# Patient Record
Sex: Male | Born: 1980 | Race: White | Hispanic: No | Marital: Single | Smoking: Former smoker
Health system: Southern US, Community
[De-identification: ages and names within clinical notes are randomized; demographics above are authoritative.]

## PROBLEM LIST (undated history)

## (undated) DIAGNOSIS — Z992 Dependence on renal dialysis: Secondary | ICD-10-CM

## (undated) DIAGNOSIS — I272 Pulmonary hypertension, unspecified: Secondary | ICD-10-CM

## (undated) DIAGNOSIS — E1140 Type 2 diabetes mellitus with diabetic neuropathy, unspecified: Secondary | ICD-10-CM

## (undated) DIAGNOSIS — E1122 Type 2 diabetes mellitus with diabetic chronic kidney disease: Secondary | ICD-10-CM

## (undated) DIAGNOSIS — I428 Other cardiomyopathies: Secondary | ICD-10-CM

## (undated) DIAGNOSIS — E11311 Type 2 diabetes mellitus with unspecified diabetic retinopathy with macular edema: Secondary | ICD-10-CM

## (undated) DIAGNOSIS — D649 Anemia, unspecified: Secondary | ICD-10-CM

## (undated) DIAGNOSIS — Z87442 Personal history of urinary calculi: Secondary | ICD-10-CM

## (undated) DIAGNOSIS — K3184 Gastroparesis: Secondary | ICD-10-CM

## (undated) DIAGNOSIS — I351 Nonrheumatic aortic (valve) insufficiency: Secondary | ICD-10-CM

## (undated) DIAGNOSIS — E1121 Type 2 diabetes mellitus with diabetic nephropathy: Secondary | ICD-10-CM

## (undated) DIAGNOSIS — E119 Type 2 diabetes mellitus without complications: Secondary | ICD-10-CM

## (undated) DIAGNOSIS — N186 End stage renal disease: Secondary | ICD-10-CM

## (undated) DIAGNOSIS — I10 Essential (primary) hypertension: Secondary | ICD-10-CM

## (undated) DIAGNOSIS — I340 Nonrheumatic mitral (valve) insufficiency: Secondary | ICD-10-CM

## (undated) DIAGNOSIS — K219 Gastro-esophageal reflux disease without esophagitis: Secondary | ICD-10-CM

## (undated) DIAGNOSIS — E11319 Type 2 diabetes mellitus with unspecified diabetic retinopathy without macular edema: Secondary | ICD-10-CM

## (undated) DIAGNOSIS — I5189 Other ill-defined heart diseases: Secondary | ICD-10-CM

## (undated) DIAGNOSIS — H5440 Blindness, one eye, unspecified eye: Secondary | ICD-10-CM

## (undated) DIAGNOSIS — N185 Chronic kidney disease, stage 5: Secondary | ICD-10-CM

## (undated) DIAGNOSIS — I5022 Chronic systolic (congestive) heart failure: Secondary | ICD-10-CM

## (undated) HISTORY — PX: OTHER SURGICAL HISTORY: SHX169

## (undated) HISTORY — PX: DIALYSIS/PERMA CATHETER INSERTION: CATH118288

## (undated) HISTORY — PX: PORT A CATH INJECTION (ARMC HX): HXRAD1731

---

## 2007-11-30 ENCOUNTER — Ambulatory Visit: Payer: Self-pay | Admitting: Internal Medicine

## 2007-12-10 ENCOUNTER — Ambulatory Visit: Payer: Self-pay | Admitting: Family Medicine

## 2008-07-08 ENCOUNTER — Emergency Department: Payer: Self-pay | Admitting: Emergency Medicine

## 2009-03-26 DIAGNOSIS — E1142 Type 2 diabetes mellitus with diabetic polyneuropathy: Secondary | ICD-10-CM | POA: Insufficient documentation

## 2009-03-26 DIAGNOSIS — N529 Male erectile dysfunction, unspecified: Secondary | ICD-10-CM | POA: Insufficient documentation

## 2011-05-29 ENCOUNTER — Emergency Department: Payer: Self-pay | Admitting: Emergency Medicine

## 2012-05-04 ENCOUNTER — Emergency Department: Payer: Self-pay | Admitting: Emergency Medicine

## 2012-05-04 LAB — BASIC METABOLIC PANEL
Anion Gap: 2 — ABNORMAL LOW (ref 7–16)
Calcium, Total: 8.7 mg/dL (ref 8.5–10.1)
Chloride: 102 mmol/L (ref 98–107)
Creatinine: 0.75 mg/dL (ref 0.60–1.30)
EGFR (Non-African Amer.): >60
Osmolality: 280 (ref 275–301)
Potassium: 4.5 mmol/L (ref 3.5–5.1)
Sodium: 136 mmol/L (ref 136–145)

## 2012-05-04 LAB — CBC
HCT: 45.1 % (ref 40.0–52.0)
MCV: 89 fL (ref 80–100)
Platelet: 181 10*3/uL (ref 150–440)
WBC: 4.5 10*3/uL (ref 3.8–10.6)

## 2012-05-04 LAB — URINALYSIS, COMPLETE
Glucose,UR: 150 mg/dL (ref 0–75)
Leukocyte Esterase: NEGATIVE
Nitrite: NEGATIVE
Ph: 6 (ref 4.5–8.0)
Protein: NEGATIVE
RBC,UR: 41 /HPF (ref 0–5)
Specific Gravity: 1.004 (ref 1.003–1.030)
WBC UR: 1 /HPF (ref 0–5)

## 2015-10-29 DIAGNOSIS — E1122 Type 2 diabetes mellitus with diabetic chronic kidney disease: Secondary | ICD-10-CM | POA: Insufficient documentation

## 2015-10-29 DIAGNOSIS — Z794 Long term (current) use of insulin: Secondary | ICD-10-CM | POA: Insufficient documentation

## 2015-10-29 DIAGNOSIS — IMO0002 Reserved for concepts with insufficient information to code with codable children: Secondary | ICD-10-CM | POA: Insufficient documentation

## 2015-10-29 HISTORY — DX: Type 2 diabetes mellitus with diabetic chronic kidney disease: E11.22

## 2016-07-08 ENCOUNTER — Encounter: Payer: Self-pay | Admitting: Emergency Medicine

## 2016-07-08 ENCOUNTER — Emergency Department: Payer: Self-pay

## 2016-07-08 ENCOUNTER — Inpatient Hospital Stay
Admission: EM | Admit: 2016-07-08 | Discharge: 2016-07-10 | DRG: 638 | Disposition: A | Discharge status: Home / Self Care | Payer: Self-pay | Attending: Internal Medicine | Admitting: Internal Medicine

## 2016-07-08 DIAGNOSIS — I214 Non-ST elevation (NSTEMI) myocardial infarction: Secondary | ICD-10-CM | POA: Diagnosis present

## 2016-07-08 DIAGNOSIS — Z833 Family history of diabetes mellitus: Secondary | ICD-10-CM

## 2016-07-08 DIAGNOSIS — Z880 Allergy status to penicillin: Secondary | ICD-10-CM

## 2016-07-08 DIAGNOSIS — E1110 Type 2 diabetes mellitus with ketoacidosis without coma: Secondary | ICD-10-CM | POA: Diagnosis present

## 2016-07-08 DIAGNOSIS — I2510 Atherosclerotic heart disease of native coronary artery without angina pectoris: Secondary | ICD-10-CM | POA: Diagnosis present

## 2016-07-08 DIAGNOSIS — I248 Other forms of acute ischemic heart disease: Secondary | ICD-10-CM | POA: Diagnosis present

## 2016-07-08 DIAGNOSIS — I219 Acute myocardial infarction, unspecified: Secondary | ICD-10-CM

## 2016-07-08 DIAGNOSIS — T63301A Toxic effect of unspecified spider venom, accidental (unintentional), initial encounter: Secondary | ICD-10-CM | POA: Diagnosis present

## 2016-07-08 DIAGNOSIS — Z87891 Personal history of nicotine dependence: Secondary | ICD-10-CM

## 2016-07-08 DIAGNOSIS — M79604 Pain in right leg: Secondary | ICD-10-CM | POA: Diagnosis present

## 2016-07-08 DIAGNOSIS — R52 Pain, unspecified: Secondary | ICD-10-CM

## 2016-07-08 DIAGNOSIS — R0789 Other chest pain: Secondary | ICD-10-CM | POA: Diagnosis present

## 2016-07-08 DIAGNOSIS — Y929 Unspecified place or not applicable: Secondary | ICD-10-CM

## 2016-07-08 DIAGNOSIS — E1310 Other specified diabetes mellitus with ketoacidosis without coma: Principal | ICD-10-CM | POA: Diagnosis present

## 2016-07-08 DIAGNOSIS — T383X6A Underdosing of insulin and oral hypoglycemic [antidiabetic] drugs, initial encounter: Secondary | ICD-10-CM | POA: Diagnosis present

## 2016-07-08 DIAGNOSIS — I82401 Acute embolism and thrombosis of unspecified deep veins of right lower extremity: Secondary | ICD-10-CM

## 2016-07-08 DIAGNOSIS — Z91128 Patient's intentional underdosing of medication regimen for other reason: Secondary | ICD-10-CM

## 2016-07-08 HISTORY — DX: Acute myocardial infarction, unspecified: I21.9

## 2016-07-08 HISTORY — DX: Type 2 diabetes mellitus without complications: E11.9

## 2016-07-08 HISTORY — DX: Type 2 diabetes mellitus with ketoacidosis without coma: E11.10

## 2016-07-08 LAB — COMPREHENSIVE METABOLIC PANEL
ALBUMIN: 3.2 g/dL — AB (ref 3.5–5.0)
ALK PHOS: 159 U/L — AB (ref 38–126)
ALT: 99 U/L — AB (ref 17–63)
AST: 21 U/L (ref 15–41)
Anion gap: 14 (ref 5–15)
BUN: 12 mg/dL (ref 6–20)
CALCIUM: 8.7 mg/dL — AB (ref 8.9–10.3)
CO2: 17 mmol/L — AB (ref 22–32)
CREATININE: 0.87 mg/dL (ref 0.61–1.24)
Chloride: 98 mmol/L — ABNORMAL LOW (ref 101–111)
GFR calc Af Amer: >60 mL/min (ref >60)
GFR calc non Af Amer: >60 mL/min (ref >60)
GLUCOSE: 377 mg/dL — AB (ref 65–99)
Potassium: 4.8 mmol/L (ref 3.5–5.1)
SODIUM: 129 mmol/L — AB (ref 135–145)
Total Bilirubin: 1.4 mg/dL — ABNORMAL HIGH (ref 0.3–1.2)
Total Protein: 6.9 g/dL (ref 6.5–8.1)

## 2016-07-08 LAB — URINE DRUG SCREEN, QUALITATIVE (ARMC ONLY)
Amphetamines, Ur Screen: NOT DETECTED
BARBITURATES, UR SCREEN: NOT DETECTED
BENZODIAZEPINE, UR SCRN: NOT DETECTED
Cannabinoid 50 Ng, Ur ~~LOC~~: NOT DETECTED
Cocaine Metabolite,Ur ~~LOC~~: NOT DETECTED
MDMA (Ecstasy)Ur Screen: NOT DETECTED
METHADONE SCREEN, URINE: NOT DETECTED
OPIATE, UR SCREEN: NOT DETECTED
PHENCYCLIDINE (PCP) UR S: NOT DETECTED
Tricyclic, Ur Screen: NOT DETECTED

## 2016-07-08 LAB — URINALYSIS COMPLETE WITH MICROSCOPIC (ARMC ONLY)
BILIRUBIN URINE: NEGATIVE
Bacteria, UA: NONE SEEN
Glucose, UA: >500 mg/dL — AB
Nitrite: POSITIVE — AB
PH: 6 (ref 5.0–8.0)
Protein, ur: 100 mg/dL — AB
SQUAMOUS EPITHELIAL / LPF: NONE SEEN
Specific Gravity, Urine: 1.029 (ref 1.005–1.030)

## 2016-07-08 LAB — CBC WITH DIFFERENTIAL/PLATELET
BASOS PCT: 0 %
Basophils Absolute: 0 10*3/uL (ref 0–0.1)
EOS ABS: 0 10*3/uL (ref 0–0.7)
Eosinophils Relative: 0 %
HCT: 42.7 % (ref 40.0–52.0)
Hemoglobin: 15.1 g/dL (ref 13.0–18.0)
Lymphocytes Relative: 9 %
Lymphs Abs: 1.1 10*3/uL (ref 1.0–3.6)
MCH: 30.8 pg (ref 26.0–34.0)
MCHC: 35.3 g/dL (ref 32.0–36.0)
MCV: 87.4 fL (ref 80.0–100.0)
MONO ABS: 1.5 10*3/uL — AB (ref 0.2–1.0)
Monocytes Relative: 12 %
Neutro Abs: 10.2 10*3/uL — ABNORMAL HIGH (ref 1.4–6.5)
Neutrophils Relative %: 79 %
PLATELETS: 157 10*3/uL (ref 150–440)
RBC: 4.88 MIL/uL (ref 4.40–5.90)
RDW: 12.6 % (ref 11.5–14.5)
WBC: 12.9 10*3/uL — ABNORMAL HIGH (ref 3.8–10.6)

## 2016-07-08 LAB — BASIC METABOLIC PANEL
Anion gap: 9 (ref 5–15)
BUN: 13 mg/dL (ref 6–20)
CALCIUM: 8.2 mg/dL — AB (ref 8.9–10.3)
CHLORIDE: 101 mmol/L (ref 101–111)
CO2: 22 mmol/L (ref 22–32)
CREATININE: 0.65 mg/dL (ref 0.61–1.24)
GFR calc non Af Amer: >60 mL/min (ref >60)
GLUCOSE: 289 mg/dL — AB (ref 65–99)
Potassium: 3.8 mmol/L (ref 3.5–5.1)
Sodium: 132 mmol/L — ABNORMAL LOW (ref 135–145)

## 2016-07-08 LAB — GLUCOSE, CAPILLARY
GLUCOSE-CAPILLARY: 234 mg/dL — AB (ref 65–99)
GLUCOSE-CAPILLARY: 264 mg/dL — AB (ref 65–99)
GLUCOSE-CAPILLARY: 336 mg/dL — AB (ref 65–99)
GLUCOSE-CAPILLARY: 364 mg/dL — AB (ref 65–99)
Glucose-Capillary: 304 mg/dL — ABNORMAL HIGH (ref 65–99)
Glucose-Capillary: 333 mg/dL — ABNORMAL HIGH (ref 65–99)

## 2016-07-08 LAB — MRSA PCR SCREENING: MRSA by PCR: NEGATIVE

## 2016-07-08 LAB — CK: Total CK: 37 U/L — ABNORMAL LOW (ref 49–397)

## 2016-07-08 LAB — BETA-HYDROXYBUTYRIC ACID: Beta-Hydroxybutyric Acid: 4.09 mmol/L — ABNORMAL HIGH (ref 0.05–0.27)

## 2016-07-08 LAB — TROPONIN I: Troponin I: 0.12 ng/mL (ref <0.03)

## 2016-07-08 MED ORDER — ONDANSETRON HCL 4 MG/2ML IJ SOLN: 4.0000 mg | Freq: Four times a day (QID) | INTRAMUSCULAR | Status: DC | PRN

## 2016-07-08 MED ORDER — SODIUM CHLORIDE 0.9% FLUSH
3.0000 mL | Freq: Two times a day (BID) | INTRAVENOUS | Status: DC
  Administered 2016-07-08 – 2016-07-10 (×3): 3 mL via INTRAVENOUS

## 2016-07-08 MED ORDER — ACETAMINOPHEN 325 MG PO TABS
650.0000 mg | ORAL_TABLET | Freq: Four times a day (QID) | ORAL | Status: DC | PRN
  Administered 2016-07-09 – 2016-07-10 (×3): 650 mg via ORAL
  Filled 2016-07-08 (×3): qty 2

## 2016-07-08 MED ORDER — CLINDAMYCIN HCL 150 MG PO CAPS
300.0000 mg | ORAL_CAPSULE | Freq: Four times a day (QID) | ORAL | Status: DC
  Administered 2016-07-09 – 2016-07-10 (×6): 300 mg via ORAL
  Filled 2016-07-08: qty 2
  Filled 2016-07-08: qty 1
  Filled 2016-07-08 (×2): qty 2
  Filled 2016-07-08: qty 1
  Filled 2016-07-08 (×3): qty 2

## 2016-07-08 MED ORDER — ONDANSETRON HCL 4 MG PO TABS: 4.0000 mg | ORAL_TABLET | Freq: Four times a day (QID) | ORAL | Status: DC | PRN

## 2016-07-08 MED ORDER — ASPIRIN EC 81 MG PO TBEC
81.0000 mg | DELAYED_RELEASE_TABLET | Freq: Every day | ORAL | Status: DC
  Administered 2016-07-09 – 2016-07-10 (×2): 81 mg via ORAL
  Filled 2016-07-08 (×3): qty 1

## 2016-07-08 MED ORDER — MORPHINE SULFATE (PF) 2 MG/ML IV SOLN: 2.0000 mg | INTRAVENOUS | Status: DC | PRN

## 2016-07-08 MED ORDER — ACETAMINOPHEN 650 MG RE SUPP: 650.0000 mg | Freq: Four times a day (QID) | RECTAL | Status: DC | PRN

## 2016-07-08 MED ORDER — ASPIRIN 81 MG PO CHEW
324.0000 mg | CHEWABLE_TABLET | Freq: Once | ORAL | Status: AC
  Administered 2016-07-08: 324 mg via ORAL
  Filled 2016-07-08: qty 4

## 2016-07-08 MED ORDER — CLINDAMYCIN PHOSPHATE 600 MG/50ML IV SOLN
600.0000 mg | Freq: Once | INTRAVENOUS | Status: AC
  Administered 2016-07-08: 600 mg via INTRAVENOUS
  Filled 2016-07-08: qty 50

## 2016-07-08 MED ORDER — SODIUM CHLORIDE 0.9 % IV SOLN
INTRAVENOUS | Status: DC
  Administered 2016-07-08: 20:00:00 via INTRAVENOUS

## 2016-07-08 MED ORDER — INSULIN ASPART 100 UNIT/ML ~~LOC~~ SOLN
8.0000 [IU] | Freq: Once | SUBCUTANEOUS | Status: AC
  Administered 2016-07-08: 8 [IU] via INTRAVENOUS
  Filled 2016-07-08: qty 8

## 2016-07-08 MED ORDER — DULOXETINE HCL 20 MG PO CPEP
20.0000 mg | ORAL_CAPSULE | Freq: Every day | ORAL | Status: DC
  Administered 2016-07-08 – 2016-07-10 (×3): 20 mg via ORAL
  Filled 2016-07-08 (×4): qty 1

## 2016-07-08 MED ORDER — GABAPENTIN 100 MG PO CAPS
200.0000 mg | ORAL_CAPSULE | Freq: Three times a day (TID) | ORAL | Status: DC
  Administered 2016-07-08 – 2016-07-10 (×5): 200 mg via ORAL
  Filled 2016-07-08 (×5): qty 2

## 2016-07-08 MED ORDER — SODIUM CHLORIDE 0.9 % IV BOLUS (SEPSIS): 1000.0000 mL | Freq: Once | INTRAVENOUS | Status: DC

## 2016-07-08 MED ORDER — SODIUM CHLORIDE 0.9 % IV BOLUS (SEPSIS)
1000.0000 mL | Freq: Once | INTRAVENOUS | Status: AC
  Administered 2016-07-08: 1000 mL via INTRAVENOUS

## 2016-07-08 MED ORDER — SODIUM CHLORIDE 0.9 % IV SOLN
INTRAVENOUS | Status: DC
  Administered 2016-07-08: 2.4 [IU]/h via INTRAVENOUS
  Administered 2016-07-08: 6 [IU]/h via INTRAVENOUS
  Filled 2016-07-08: qty 2.5

## 2016-07-08 MED ORDER — OXYCODONE HCL 5 MG PO TABS
5.0000 mg | ORAL_TABLET | ORAL | Status: DC | PRN
  Administered 2016-07-09: 5 mg via ORAL
  Filled 2016-07-08 (×2): qty 1

## 2016-07-08 MED ORDER — POTASSIUM CHLORIDE 10 MEQ/100ML IV SOLN
10.0000 meq | INTRAVENOUS | Status: AC
  Administered 2016-07-08 (×2): 10 meq via INTRAVENOUS
  Filled 2016-07-08 (×2): qty 100

## 2016-07-08 MED ORDER — DEXTROSE-NACL 5-0.45 % IV SOLN
INTRAVENOUS | Status: DC
  Administered 2016-07-08 – 2016-07-09 (×2): via INTRAVENOUS

## 2016-07-08 MED ORDER — ENOXAPARIN SODIUM 60 MG/0.6ML ~~LOC~~ SOLN
1.0000 mg/kg | Freq: Two times a day (BID) | SUBCUTANEOUS | Status: DC
  Administered 2016-07-09 – 2016-07-10 (×3): 60 mg via SUBCUTANEOUS
  Filled 2016-07-08 (×5): qty 0.6

## 2016-07-08 MED ORDER — ATORVASTATIN CALCIUM 20 MG PO TABS
40.0000 mg | ORAL_TABLET | Freq: Every day | ORAL | Status: DC
  Administered 2016-07-08: 40 mg via ORAL
  Filled 2016-07-08: qty 2

## 2016-07-08 NOTE — H&P (Addendum)
Mark [name] at [place] NAME: Mark [name]    MR#:  [id number]  DATE OF BIRTH:  Apr 11, 1981   DATE OF ADMISSION:  07/08/2016  PRIMARY CARE PHYSICIAN: No primary care provider on file.   REQUESTING/REFERRING PHYSICIAN: Paduchowski  CHIEF COMPLAINT:   Chief Complaint  Patient presents with  . Chest Pain  . Insect Bite    HISTORY OF PRESENT ILLNESS:  Mark [name]  is a 35 y.o. male with a known history of Diabetes, insulin requiring who is presenting with chest pain. He states approximately 3 day duration intermittent chest pain described as pressure originally retrosternal in location or radiation to the left arm now mostly localized over the left chest. Intensity 7/10 no worsening or relieving factors. He also attests having subjective fevers chills generalized malaise URI-like symptoms including conjunctivitis-those symptoms of being mostly resolved. He also has complaints in regards to a spider bite on the right forearm. Given all of these symptoms decided to present to Hospital further workup and evaluation. He is found to have elevated troponin as well as elevated glucose and acidotic. He states he has not taken his insulin for a few months   PAST MEDICAL HISTORY:   Past Medical History  Diagnosis Date  . Diabetes mellitus without complication ([place])     PAST SURGICAL HISTORY:  History reviewed. No pertinent past surgical history.  SOCIAL HISTORY:   Social History  Substance Use Topics  . Smoking status: Former Research scientist (life sciences)  . Smokeless tobacco: Not on file  . Alcohol Use: No    FAMILY HISTORY:   Family History  Problem Relation Age of Onset  . Diabetes Other     DRUG ALLERGIES:   Allergies  Allergen Reactions  . Penicillins Rash    REVIEW OF SYSTEMS:  REVIEW OF SYSTEMS:  CONSTITUTIONAL: Positive fevers, chills, fatigue, weakness.  EYES: Denies blurred vision, double vision, or eye pain.  EARS, NOSE, THROAT: Denies  tinnitus, ear pain, hearing loss.  RESPIRATORY: denies cough, shortness of breath, wheezing  CARDIOVASCULAR: Positive chest pain, denies palpitations, edema.  GASTROINTESTINAL: Denies nausea, vomiting, diarrhea, abdominal pain.  GENITOURINARY: Denies dysuria, hematuria.  ENDOCRINE: Denies nocturia or thyroid problems. HEMATOLOGIC AND LYMPHATIC: Denies easy bruising or bleeding.  SKIN: Positive lesion right forearm as described above Denies rash or lesions.  MUSCULOSKELETAL: Denies pain in neck, back, shoulder, knees, hips, or further arthritic symptoms.  NEUROLOGIC: Denies paralysis, paresthesias.  PSYCHIATRIC: Denies anxiety or depressive symptoms. Otherwise full review of systems performed by me is negative.   MEDICATIONS AT HOME:   Prior to Admission medications   Medication Sig Start Date End Date Taking? Authorizing Provider  Dulaglutide (TRULICITY) A999333 0000000 SOPN Inject 0.75 mg into the skin every 7 (seven) days.   Yes Historical Provider, MD  DULoxetine (CYMBALTA) 20 MG capsule Take 20 mg by mouth daily.   Yes Historical Provider, MD  gabapentin (NEURONTIN) 100 MG capsule Take 200 mg by mouth 3 (three) times daily.   Yes Historical Provider, MD  insulin detemir (LEVEMIR) 100 unit/ml SOLN Inject 20 Units into the skin 2 (two) times daily.   Yes Historical Provider, MD      VITAL SIGNS:  Blood pressure 122/85, pulse 85, temperature 98.6 F (37 C), temperature source Oral, resp. rate 18, height 5\' 10"  (1.778 m), weight 132 lb (59.875 kg), SpO2 99 %.  PHYSICAL EXAMINATION:  VITAL SIGNS: Filed Vitals:   07/08/16 1608  BP: 122/85  Pulse: 85  Temp: 98.6  F (37 C)  Resp: 78   GENERAL:35 y.o.male currently in Mild acute distress.  HEAD: Normocephalic, atraumatic.  EYES: Pupils equal, round, reactive to light. Extraocular muscles intact. No scleral icterus.  MOUTH: Moist mucosal membrane. Dentition intact. No abscess noted.  EAR, NOSE, THROAT: Clear without exudates. No  external lesions.  NECK: Supple. No thyromegaly. No nodules. No JVD.  PULMONARY: Clear to ascultation, without wheeze rails or rhonci. No use of accessory muscles, Good respiratory effort. good air entry bilaterally CHEST: Nontender to palpation.  CARDIOVASCULAR: S1 and S2. Regular rate and rhythm. No murmurs, rubs, or gallops. No edema. Pedal pulses 2+ bilaterally.  GASTROINTESTINAL: Soft, nontender, nondistended. No masses. Positive bowel sounds. No hepatosplenomegaly.  MUSCULOSKELETAL: No swelling, clubbing, or edema. Range of motion full in all extremities.  NEUROLOGIC: Cranial nerves II through XII are intact. No gross focal neurological deficits. Sensation intact. Reflexes intact.  SKIN: Approximately 2 x 2 centimeter raised lesion with surrounding erythema warm and tender on the right forearm otherwise No ulceration, lesions, rashes, or cyanosis. Skin warm and dry. Turgor intact.  PSYCHIATRIC: Mood, affect within normal limits. The patient is awake, alert and oriented x 3. Insight, judgment intact.    LABORATORY PANEL:   CBC  Recent Labs Lab 07/08/16 1639  WBC 12.9*  HGB 15.1  HCT 42.7  PLT 157   ------------------------------------------------------------------------------------------------------------------  Chemistries   Recent Labs Lab 07/08/16 1639  NA 129*  K 4.8  CL 98*  CO2 17*  GLUCOSE 377*  BUN 12  CREATININE 0.87  CALCIUM 8.7*  AST 21  ALT 99*  ALKPHOS 159*  BILITOT 1.4*   ------------------------------------------------------------------------------------------------------------------  Cardiac Enzymes  Recent Labs Lab 07/08/16 1639  TROPONINI 0.12*   ------------------------------------------------------------------------------------------------------------------  RADIOLOGY:  Dg Chest 2 View  07/08/2016  CLINICAL DATA:  Chest pain for 2 days, initial encounter EXAM: CHEST  2 VIEW COMPARISON:  None. FINDINGS: The heart size and mediastinal  contours are within normal limits. Both lungs are clear. The visualized skeletal structures are unremarkable. IMPRESSION: No active cardiopulmonary disease. Electronically Signed   By: Inez Catalina M.D.   On: 07/08/2016 17:14   Dg Tibia/fibula Right  07/08/2016  CLINICAL DATA:  Right knee pain, no known injury, initial encounter EXAM: RIGHT TIBIA AND FIBULA - 2 VIEW COMPARISON:  None. FINDINGS: There is no evidence of fracture or other focal bone lesions. Soft tissues are unremarkable. IMPRESSION: No acute abnormality noted. Electronically Signed   By: Inez Catalina M.D.   On: 07/08/2016 17:23    EKG:   Orders placed or performed during the hospital encounter of 07/08/16  . ED EKG  . ED EKG  . EKG 12-Lead  . EKG 12-Lead    IMPRESSION AND PLAN:   35 year old Caucasian gentleman history of insulin-requiring diabetes presenting with chest pain  1. NSTEMI: Chest pain, elevated cardiac enzyme, aspirin statin therapy Lovenox telemetry cardiology echocardiogram 2. DKA: Patient is acidotic, elevated anion gap, check urinalysis for ketones-continue IV fluid hydration every 4 hour BMP every hour Accu-Chek initiate insulin drip per DKA protocol Transition off of insulin drip when anion gap is closed 3. Venous thromboembolism prophylactic: Therapeutic Lovenox 4. Spider bite: Continue with clindamycin as initiated emergency department   All the records are reviewed and case discussed with ED provider. Management plans discussed with the patient, family and they are in agreement.  CODE STATUS: full  TOTAL TIME TAKING CARE OF THIS PATIENT: 45 critical minutes.    Hower,  Karenann Cai.D on 07/08/2016  at 6:41 PM  Between 7am to 6pm - Pager - [phone]  After 6pm: House Pager: - [phone]  [facility]  Office  [phone]  CC: Primary care physician; No primary care provider on file.

## 2016-07-08 NOTE — ED Provider Notes (Signed)
[facility] Emergency Department Provider Note  Time seen: 5:53 PM  I have reviewed the triage vital signs and the nursing notes.   HISTORY  Chief Complaint Chest Pain and Insect Bite    HPI Mark [name] is a 35 y.o. male with a past medical history of diabetes who presents the emergency department with chest discomfort for the past 2 days. According to the patient for the past 2 days he has been experiencing moderate 5/10 chest discomfort she describes as a band or tightness sensation around the center of her chest radiating to the left side. States it was very bad Sunday associated with nausea and diaphoresis. Denies any shortness of breath. Denies abdominal pain. Denies leg pain or swelling. Denies difficulty breathing cough, congestion or fever. Patient states he has been out of insulin for the past 6 months due to finances.     Past Medical History  Diagnosis Date  . Diabetes mellitus without complication ([place])     There are no active problems to display for this patient.   History reviewed. No pertinent past surgical history.  Current Outpatient Rx  Name  Route  Sig  Dispense  Refill  . Dulaglutide (TRULICITY) A999333 0000000 SOPN   Subcutaneous   Inject 0.75 mg into the skin every 7 (seven) days.         . DULoxetine (CYMBALTA) 20 MG capsule   Oral   Take 20 mg by mouth daily.         Marland Kitchen gabapentin (NEURONTIN) 100 MG capsule   Oral   Take 200 mg by mouth 3 (three) times daily.         . insulin detemir (LEVEMIR) 100 unit/ml SOLN   Subcutaneous   Inject 20 Units into the skin 2 (two) times daily.           Allergies Penicillins  No family history on file.  Social History Social History  Substance Use Topics  . Smoking status: None  . Smokeless tobacco: None  . Alcohol Use: None    Review of Systems Constitutional: Negative for fever. Cardiovascular: Positive for chest pain 3 days. Respiratory: Negative for  shortness of breath. Gastrointestinal: Negative for abdominal pain Genitourinary: Negative for dysuria. Neurological: Negative for headache 10-point ROS otherwise negative.  ____________________________________________   PHYSICAL EXAM:  VITAL SIGNS: ED Triage Vitals  Enc Vitals Group     BP 07/08/16 1608 122/85 mmHg     Pulse Rate 07/08/16 1608 85     Resp 07/08/16 1608 18     Temp 07/08/16 1608 98.6 F (37 C)     Temp Source 07/08/16 1608 Oral     SpO2 07/08/16 1608 99 %     Weight 07/08/16 1608 132 lb (59.875 kg)     Height 07/08/16 1608 5\' 10"  (1.778 m)     Head Cir --      Peak Flow --      Pain Score 07/08/16 1608 7     Pain Loc --      Pain Edu? --      Excl. in [place]? --     Constitutional: Alert and oriented. Well appearing and in no distress. Eyes: Normal exam ENT   Head: Normocephalic and atraumatic   Mouth/Throat: Mucous membranes are moist. Cardiovascular: Normal rate, regular rhythm. No murmur Respiratory: Normal respiratory effort without tachypnea nor retractions. Breath sounds are clear. Moderate left chest tenderness to palpation.  Gastrointestinal: Soft and nontender. No distention.  Musculoskeletal: Nontender with normal range of motion in all extremities. No lower extremity edema or tenderness. Neurologic:  Normal speech and language. No gross focal neurologic deficits  Skin:  Skin is warm, dry and intact.  Psychiatric: Mood and affect are normal  ____________________________________________    EKG  EKG reviewed and interpreted, so shows normal sinus rhythm at 84 bpm, narrow QRS, normal axis, normal intervals, no ST changes. Overall normal EKG.  ____________________________________________    RADIOLOGY  Chest x-ray negative Right tib-fib x-ray negative.  ____________________________________________   INITIAL IMPRESSION / ASSESSMENT AND PLAN / ED COURSE  Pertinent labs & imaging results that were available during my care of the  patient were reviewed by me and considered in my medical decision making (see chart for details).  Patient presents the emergency department with complaints of chest pain for the past 2 days. Chest pain is somewhat reproducible. The patient states he has been off of his insulin for 6 months. Patient's labs show an elevated troponin 0.12.   Mild leukocytosis 12,900. Sodium 129, blood glucose of 377. We'll IV hydrate, treat with insulin, potassium 4.8. We will dose aspirin and admitted to the hospital for further evaluation given his elevated troponin with otherwise normal kidney function and his history of noncompliance with elevated blood glucose. ____________________________________________   FINAL CLINICAL IMPRESSION(S) / ED DIAGNOSES   chest pain Hyperglycemia   Harvest Dark, MD 07/08/16 1758

## 2016-07-08 NOTE — ED Provider Notes (Signed)
[facility] Emergency Department Provider Note  ____________________________________________  Time seen: Approximately 4:13 PM  I have reviewed the triage vital signs and the nursing notes.   HISTORY  Chief Complaint Chest Pain and Insect Bite   HPI Mark [name] is a 35 y.o. male who presents to the emergency department for evaluation of chest discomfort, concern for infection on his right elbow, and pain in his right leg. He is an insulin dependent diabetic who has been out of his insulin for the last 6 months. He has no insurance and can not afford it and has no primary care provider. Chest discomfort started 2 days ago, insect bite to the right elbow occurred several days ago and he has been scratching it and now it hurts and has gotten red.  Past Medical History  Diagnosis Date  . Diabetes mellitus without complication ([place])     There are no active problems to display for this patient.   History reviewed. No pertinent past surgical history.  Current Outpatient Rx  Name  Route  Sig  Dispense  Refill  . Dulaglutide (TRULICITY) A999333 0000000 SOPN   Subcutaneous   Inject 0.75 mg into the skin every 7 (seven) days.         . DULoxetine (CYMBALTA) 20 MG capsule   Oral   Take 20 mg by mouth daily.         Marland Kitchen gabapentin (NEURONTIN) 100 MG capsule   Oral   Take 200 mg by mouth 3 (three) times daily.         . insulin detemir (LEVEMIR) 100 unit/ml SOLN   Subcutaneous   Inject 20 Units into the skin 2 (two) times daily.           Allergies Penicillins  No family history on file.  Social History Social History  Substance Use Topics  . Smoking status: None  . Smokeless tobacco: None  . Alcohol Use: None    Review of Systems Constitutional: No fever/chills Eyes: No visual changes. ENT: No sore throat. Cardiovascular: Positive for chest pain. Respiratory: Denies shortness of breath. Gastrointestinal: No abdominal pain.  No  nausea, no vomiting.  No diarrhea. Genitourinary: Negative for dysuria. Musculoskeletal: Negative for back pain. Skin: Negative for rash.Positive for lesion. Neurological: Negative for headaches, focal weakness or numbness. ____________________________________________   PHYSICAL EXAM:  VITAL SIGNS: ED Triage Vitals  Enc Vitals Group     BP 07/08/16 1608 122/85 mmHg     Pulse Rate 07/08/16 1608 85     Resp 07/08/16 1608 18     Temp 07/08/16 1608 98.6 F (37 C)     Temp Source 07/08/16 1608 Oral     SpO2 07/08/16 1608 99 %     Weight 07/08/16 1608 132 lb (59.875 kg)     Height 07/08/16 1608 5\' 10"  (1.778 m)     Head Cir --      Peak Flow --      Pain Score 07/08/16 1608 7     Pain Loc --      Pain Edu? --      Excl. in [place]? --     Constitutional: Alert and oriented. Ill appearing and in no acute distress. Eyes: Conjunctivae are normal. PERRL. EOMI. Head: Atraumatic. Nose: No congestion/rhinnorhea. Mouth/Throat: Mucous membranes are moist.  Oropharynx non-erythematous. Neck: No stridor.   Cardiovascular: Normal rate, regular rhythm. Grossly normal heart sounds.  Good peripheral circulation. Respiratory: Normal respiratory effort.  No retractions. Lungs  CTAB. Gastrointestinal: Soft and nontender. No distention. No abdominal bruits. No CVA tenderness. Musculoskeletal: Firm, palpable mass noted over the lateral, proximal fibula without associated skin changes or indication of trauma. No joint effusions. Neurologic:  Normal speech and language. No gross focal neurologic deficits are appreciated. No gait instability. Skin: Open, draining lesion noted to the right elbow with localized erythema.  Psychiatric: Mood and affect are normal. Speech and behavior are normal.  ____________________________________________   LABS (all labs ordered are listed, but only abnormal results are displayed)  Labs Reviewed  CBC WITH DIFFERENTIAL/PLATELET - Abnormal; Notable for the following:     WBC 12.9 (*)    Neutro Abs 10.2 (*)    Monocytes Absolute 1.5 (*)    All other components within normal limits  COMPREHENSIVE METABOLIC PANEL - Abnormal; Notable for the following:    Sodium 129 (*)    Chloride 98 (*)    CO2 17 (*)    Glucose, Bld 377 (*)    Calcium 8.7 (*)    Albumin 3.2 (*)    ALT 99 (*)    Alkaline Phosphatase 159 (*)    Total Bilirubin 1.4 (*)    All other components within normal limits  TROPONIN I - Abnormal; Notable for the following:    Troponin I 0.12 (*)    All other components within normal limits  CK - Abnormal; Notable for the following:    Total CK 37 (*)    All other components within normal limits  GLUCOSE, CAPILLARY - Abnormal; Notable for the following:    Glucose-Capillary 364 (*)    All other components within normal limits  URINALYSIS COMPLETEWITH MICROSCOPIC ([facility] ONLY)  URINE DRUG SCREEN, QUALITATIVE ([facility] ONLY)   ____________________________________________  EKG  Normal Sinus Rhythm ____________________________________________  RADIOLOGY  Pending upon transfer of care. ____________________________________________   PROCEDURES  Procedure(s) performed: None  Procedures  Critical Care performed: No  ____________________________________________   INITIAL IMPRESSION / ASSESSMENT AND PLAN / ED COURSE  Pertinent labs & imaging results that were available during my care of the patient were reviewed by me and considered in my medical decision making (see chart for details).  Patient care transferred to Dr. Kerman Passey at [phone]. ____________________________________________   FINAL CLINICAL IMPRESSION(S) / ED DIAGNOSES  Final diagnoses:  Pain aggravated by walking      NEW MEDICATIONS STARTED DURING THIS VISIT:  New Prescriptions   No medications on file     Note:  This document was prepared using Dragon voice recognition software and may include unintentional dictation errors.    Victorino Dike,  FNP 07/08/16 1737  Harvest Dark, MD 07/08/16 2332

## 2016-07-08 NOTE — ED Notes (Addendum)
Pt in via triage with multiple complaints; pt reports feeling like his eyes were swollen beginning on Saturday, taking Sudafed and feeling better the next day.  Beginning on Sunday, pt states generalized chest pain, describing it as "muscle pain which is tender to touch" along with fever, cough.  Pt also reports right knee pain since Sunday, denying any recent injury.  Pt with possible spider bite to right arm since Friday, pt unsure if that has anything to do with the way he is feeling.  Pt A/Ox4, in no immediate distress at this time.

## 2016-07-09 ENCOUNTER — Inpatient Hospital Stay: Payer: Self-pay

## 2016-07-09 ENCOUNTER — Inpatient Hospital Stay
Admit: 2016-07-09 | Discharge: 2016-07-09 | Disposition: A | Discharge status: Home / Self Care | Payer: Self-pay | Attending: Internal Medicine | Admitting: Internal Medicine

## 2016-07-09 LAB — BASIC METABOLIC PANEL
ANION GAP: 6 (ref 5–15)
ANION GAP: 6 (ref 5–15)
Anion gap: 5 (ref 5–15)
BUN: 15 mg/dL (ref 6–20)
BUN: 15 mg/dL (ref 6–20)
BUN: 15 mg/dL (ref 6–20)
CALCIUM: 8.2 mg/dL — AB (ref 8.9–10.3)
CALCIUM: 8.2 mg/dL — AB (ref 8.9–10.3)
CHLORIDE: 104 mmol/L (ref 101–111)
CO2: 22 mmol/L (ref 22–32)
CO2: 23 mmol/L (ref 22–32)
CO2: 23 mmol/L (ref 22–32)
CREATININE: 0.44 mg/dL — AB (ref 0.61–1.24)
Calcium: 8.2 mg/dL — ABNORMAL LOW (ref 8.9–10.3)
Chloride: 105 mmol/L (ref 101–111)
Chloride: 104 mmol/L (ref 101–111)
Creatinine, Ser: 0.56 mg/dL — ABNORMAL LOW (ref 0.61–1.24)
Creatinine, Ser: 0.57 mg/dL — ABNORMAL LOW (ref 0.61–1.24)
GFR calc Af Amer: >60 mL/min (ref >60)
GFR calc non Af Amer: >60 mL/min (ref >60)
GLUCOSE: 166 mg/dL — AB (ref 65–99)
Glucose, Bld: 144 mg/dL — ABNORMAL HIGH (ref 65–99)
Glucose, Bld: 210 mg/dL — ABNORMAL HIGH (ref 65–99)
POTASSIUM: 3.5 mmol/L (ref 3.5–5.1)
POTASSIUM: 3.6 mmol/L (ref 3.5–5.1)
Potassium: 3.5 mmol/L (ref 3.5–5.1)
SODIUM: 133 mmol/L — AB (ref 135–145)
SODIUM: 133 mmol/L — AB (ref 135–145)
SODIUM: 132 mmol/L — AB (ref 135–145)

## 2016-07-09 LAB — GLUCOSE, CAPILLARY
GLUCOSE-CAPILLARY: 180 mg/dL — AB (ref 65–99)
GLUCOSE-CAPILLARY: 114 mg/dL — AB (ref 65–99)
GLUCOSE-CAPILLARY: 135 mg/dL — AB (ref 65–99)
GLUCOSE-CAPILLARY: 162 mg/dL — AB (ref 65–99)
GLUCOSE-CAPILLARY: 192 mg/dL — AB (ref 65–99)
GLUCOSE-CAPILLARY: 208 mg/dL — AB (ref 65–99)
GLUCOSE-CAPILLARY: 188 mg/dL — AB (ref 65–99)
GLUCOSE-CAPILLARY: 281 mg/dL — AB (ref 65–99)
Glucose-Capillary: 213 mg/dL — ABNORMAL HIGH (ref 65–99)
Glucose-Capillary: 161 mg/dL — ABNORMAL HIGH (ref 65–99)
Glucose-Capillary: 135 mg/dL — ABNORMAL HIGH (ref 65–99)
Glucose-Capillary: 179 mg/dL — ABNORMAL HIGH (ref 65–99)
Glucose-Capillary: 243 mg/dL — ABNORMAL HIGH (ref 65–99)
Glucose-Capillary: 186 mg/dL — ABNORMAL HIGH (ref 65–99)
Glucose-Capillary: 184 mg/dL — ABNORMAL HIGH (ref 65–99)
Glucose-Capillary: 216 mg/dL — ABNORMAL HIGH (ref 65–99)
Glucose-Capillary: 264 mg/dL — ABNORMAL HIGH (ref 65–99)

## 2016-07-09 LAB — ECHOCARDIOGRAM COMPLETE
HEIGHTINCHES: 70 in
WEIGHTICAEL: 2112 [oz_av]

## 2016-07-09 LAB — HEMOGLOBIN A1C: HEMOGLOBIN A1C: 14.2 % — AB (ref 4.0–6.0)

## 2016-07-09 LAB — LIPID PANEL
CHOL/HDL RATIO: 2.4 ratio
Cholesterol: 80 mg/dL (ref 0–200)
HDL: 33 mg/dL — ABNORMAL LOW (ref >40)
LDL CALC: 33 mg/dL (ref 0–99)
TRIGLYCERIDES: 71 mg/dL (ref <150)
VLDL: 14 mg/dL (ref 0–40)

## 2016-07-09 LAB — TROPONIN I
TROPONIN I: 0.84 ng/mL — AB (ref <0.03)
Troponin I: 1.12 ng/mL (ref <0.03)

## 2016-07-09 MED ORDER — INSULIN REGULAR BOLUS VIA INFUSION
0.0000 [IU] | Freq: Three times a day (TID) | INTRAVENOUS | Status: AC
Start: 2016-07-09 — End: 2016-07-09
  Administered 2016-07-09: 1.2 [IU] via INTRAVENOUS
  Filled 2016-07-09: qty 10

## 2016-07-09 MED ORDER — INSULIN ASPART 100 UNIT/ML ~~LOC~~ SOLN
0.0000 [IU] | Freq: Three times a day (TID) | SUBCUTANEOUS | Status: DC
  Administered 2016-07-09: 5 [IU] via SUBCUTANEOUS
  Administered 2016-07-09: 3 [IU] via SUBCUTANEOUS
  Filled 2016-07-09: qty 3
  Filled 2016-07-09: qty 5

## 2016-07-09 MED ORDER — INSULIN ASPART 100 UNIT/ML ~~LOC~~ SOLN
0.0000 [IU] | Freq: Every day | SUBCUTANEOUS | Status: DC
  Administered 2016-07-09: 3 [IU] via SUBCUTANEOUS
  Filled 2016-07-09: qty 3

## 2016-07-09 MED ORDER — INSULIN REGULAR BOLUS VIA INFUSION: 0.0000 [IU] | Freq: Three times a day (TID) | INTRAVENOUS | Status: DC

## 2016-07-09 MED ORDER — ATORVASTATIN CALCIUM 20 MG PO TABS
40.0000 mg | ORAL_TABLET | Freq: Every day | ORAL | Status: DC
  Administered 2016-07-09: 40 mg via ORAL
  Filled 2016-07-09: qty 2

## 2016-07-09 MED ORDER — CARVEDILOL 3.125 MG PO TABS
3.1250 mg | ORAL_TABLET | Freq: Two times a day (BID) | ORAL | Status: DC
  Administered 2016-07-09: 3.125 mg via ORAL
  Filled 2016-07-09: qty 1

## 2016-07-09 MED ORDER — INSULIN GLARGINE 100 UNIT/ML ~~LOC~~ SOLN
18.0000 [IU] | Freq: Every day | SUBCUTANEOUS | Status: DC
  Administered 2016-07-09: 18 [IU] via SUBCUTANEOUS
  Filled 2016-07-09 (×2): qty 0.18

## 2016-07-09 NOTE — Progress Notes (Addendum)
Called to room by Pt's wife. She noted that the veins in his RLE were distended. This RN noted same and that distention goes away when leg elevated. Pt also noted to be unable to tolerate putting weight on his RLE. There are no observable masses or point tenderness on palpation. 2+ DP pulses, full ROM and 3+ cap refill. No edema noted in comparison to LLE.   Dr. Benjie Karvonen notified. Orders received.

## 2016-07-09 NOTE — Progress Notes (Addendum)
Inpatient Diabetes Program Recommendations  AACE/ADA: New Consensus Statement on Inpatient Glycemic Control (2015)  Target Ranges:  Prepandial:   less than 140 mg/dL      Peak postprandial:   less than 180 mg/dL (1-2 hours)      Critically ill patients:  140 - 180 mg/dL  Results for Mark [name], Mark [name] (MRN [id number]) as of 07/09/2016 07:59  Ref. Range 07/08/2016 20:55 07/08/2016 21:13 07/08/2016 22:56 07/08/2016 23:58 07/09/2016 01:01 07/09/2016 02:04 07/09/2016 02:49 07/09/2016 03:58 07/09/2016 05:03 07/09/2016 06:04 07/09/2016 07:08  Glucose-Capillary Latest Ref Range: 65-99 mg/dL 333 (H) 336 (H) 264 (H) 234 (H) 213 (H) 180 (H) 161 (H) 135 (H) 114 (H) 135 (H) 162 (H)   Review of Glycemic Control  Outpatient Diabetes medications: Levemir 20 units BID, Trulicity A999333 mg weekly Current orders for Inpatient glycemic control: Novolin R insulin drip per DKA order set  Inpatient Diabetes Program Recommendations: Insulin - Basal: At time of transition from IV to SQ insulin, please consider ordering Lantus 18 units Q24H (based on 59.8 kg x 0.3 units). Correction (SSI): At time of transition from IV to SQ insulin, please consider ordering Novolog 0-9 units Q4H. HgbA1C: A1C in process.  Addendum 07/09/16@11 :42-Spoke with patient about diabetes and home regimen for diabetes control. Patient states that he was diagnosed with diabetes in 2006 and was prescribed oral DM medications at that time. Patient states that the last DM medications he was prescribed was Levemir (insulin pens) 20 units BID and Trulicity (not sure of dose) once a week. Patient reports that he was taking Levemir and Trulicity as prescribed until he lost his insurance and was not able to afford to purchase them out of pocket. Patient states that he has not taken any DM medications in months. Patient reports that he has not seen his PCP since March 2017 because he lost his insurance. According to the chart patient last seen his PCP on 03/12/16 and was  referred to an Endocrinologist for assistance with diabetes control. Patient reports that he had an initial appointment with Dr. Graceann Congress but was not able to go back to her for follow up after losing his insurance. Discussed glucose and A1C goals. Discussed importance of checking CBGs and maintaining good CBG control to prevent long-term and short-term complications. Explained how hyperglycemia leads to damage within blood vessels which lead to the common complications seen with uncontrolled diabetes. Stressed to the patient the importance of improving glycemic control to prevent further complications from uncontrolled diabetes. Discussed [facility] and informed patient that a consult for Case Manager would be ordered so CM can provide handout information for both. Patient states he would be agreeable to go to the [facility] and the [facility]. Discussed more affordable insulins (RELI-ON NPH, Regular, and 70/30) which can be purchased at  [facility] for $25 per vial. Patient reports that he is familiar with the generic insulins at [facility] and has used them in the past.  Encouraged patient to check his glucose as prescribed by doctor and to keep a log book of glucose readings and insulin taken which he will need to take to doctor appointments.  Patient verbalized understanding of information discussed and he states that he has no further questions at this time related to diabetes.  MD: Please keep in mind that at time of discharge, patient will need to be transitioned to a more affordable insulin such as 70/30.  Thanks, Barnie Alderman, RN, MSN, CDE Diabetes Coordinator Inpatient Diabetes  Program [phone] (Team Pager from [place] to [place]) [phone] ([facility] office) [phone] [facility] office) [phone] [facility] office)

## 2016-07-09 NOTE — Progress Notes (Signed)
*  PRELIMINARY RESULTS* Echocardiogram 2D Echocardiogram has been performed.  Mark [name] 07/09/2016, 10:04 AM

## 2016-07-09 NOTE — Progress Notes (Signed)
Pt continues to have some mild chest pain. Echocardiogram showed mildly depressed LV function with EF 53%, anteroseptal wall hypokinesis and mild MR. Advise cardiac cath tomorrow morning with Dr. Humphrey Rolls. Have discussed with the patient and his wife and they are in agreement with plan.

## 2016-07-09 NOTE — Consult Note (Signed)
Mark [name] is a 35 y.o. male  [id number]  Primary Cardiologist: Neoma Laming Reason for Consultation: Chest pain, elevated troponin   HPI: Mark [name] developed midsternal chest pain radiating out bilaterally on Sunday and this was associated with mild dyspnea and diaphoresis. This lasted all day. Currently his pain is localized to bilateral pectoral muscles and is reproducible with palpation. He currently has no dyspnea, diaphoresis, nausea or lightheadedness. Patient has a history of diabetes and denies hypertension or hyperlipidemia or any previous cardiac illness. He has a family history significant for maternal grandfather beginning in his 60s. He smoked for short time having quit over 10 years ago and does not drink regularly. He is uninsured, does not have a primary care provider, and has not been treating his diabetes over the last 6 months.   Review of Systems: positive for chest pain 2 days ago that was pressure-like, no further pressure but has aching. Positive for shortness of breath 2 days ago, none since. Negative for palpitations, swelling, lightheadedness, or nausea   Past Medical History  Diagnosis Date  . Diabetes mellitus without complication ([place])     Medications Prior to Admission  Medication Sig Dispense Refill  . Dulaglutide (TRULICITY) A999333 0000000 SOPN Inject 0.75 mg into the skin every 7 (seven) days.    . DULoxetine (CYMBALTA) 20 MG capsule Take 20 mg by mouth daily.    Marland Kitchen gabapentin (NEURONTIN) 100 MG capsule Take 200 mg by mouth 3 (three) times daily.    . insulin detemir (LEVEMIR) 100 unit/ml SOLN Inject 20 Units into the skin 2 (two) times daily.       Marland Kitchen aspirin EC  81 mg Oral Daily  . atorvastatin  40 mg Oral q1800  . clindamycin  300 mg Oral Q6H  . DULoxetine  20 mg Oral Daily  . enoxaparin (LOVENOX) injection  1 mg/kg Subcutaneous Q12H  . gabapentin  200 mg Oral TID  . insulin regular  0-10 Units Intravenous TID WC  . sodium chloride  1,000 mL  Intravenous Once  . sodium chloride flush  3 mL Intravenous Q12H    Infusions: . sodium chloride Stopped (07/08/16 2230)  . dextrose 5 % and 0.45% NaCl 75 mL/hr at 07/08/16 2001  . insulin (NOVOLIN-R) infusion 4 Units/hr (07/09/16 0831)    Allergies  Allergen Reactions  . Penicillins Rash    Social History   Social History  . Marital Status: Single    Spouse Name: N/A  . Number of Children: N/A  . Years of Education: N/A   Occupational History  . Not on file.   Social History Main Topics  . Smoking status: Former Research scientist (life sciences)  . Smokeless tobacco: Not on file  . Alcohol Use: No  . Drug Use: Not on file  . Sexual Activity: Not on file   Other Topics Concern  . Not on file   Social History Narrative  . No narrative on file    Family History  Problem Relation Age of Onset  . Diabetes Other     PHYSICAL EXAM: Filed Vitals:   07/09/16 0600 07/09/16 0700  BP: 120/82 112/76  Pulse: 84 79  Temp:    Resp: 22 20     Intake/Output Summary (Last 24 hours) at 07/09/16 0854 Last data filed at 07/09/16 0600  Gross per 24 hour  Intake      0 ml  Output    750 ml  Net   -750 ml    General:  Well  appearing. No respiratory difficulty HEENT: normal Neck: supple. no JVD. Carotids 2+ bilat; no bruits. No lymphadenopathy or thryomegaly appreciated. Cor: PMI nondisplaced. Regular rate & rhythm. No rubs, gallops or murmurs. Lungs: clear Abdomen: soft, nontender, nondistended. No hepatosplenomegaly. No bruits or masses. Good bowel sounds. Extremities: no cyanosis, clubbing, rash, edema Neuro: alert & oriented x 3, cranial nerves grossly intact. moves all 4 extremities w/o difficulty. Affect pleasant.  ECG: Normal sinus rhythm, 84 bpm, no ischemic changes   Results for orders placed or performed during the hospital encounter of 07/08/16 (from the past 24 hour(s))  Glucose, capillary     Status: Abnormal   Collection Time: 07/08/16  4:21 PM  Result Value Ref Range    Glucose-Capillary 364 (H) 65 - 99 mg/dL  CBC with Differential     Status: Abnormal   Collection Time: 07/08/16  4:39 PM  Result Value Ref Range   WBC 12.9 (H) 3.8 - 10.6 K/uL   RBC 4.88 4.40 - 5.90 MIL/uL   Hemoglobin 15.1 13.0 - 18.0 g/dL   HCT 42.7 40.0 - 52.0 %   MCV 87.4 80.0 - 100.0 fL   MCH 30.8 26.0 - 34.0 pg   MCHC 35.3 32.0 - 36.0 g/dL   RDW 12.6 11.5 - 14.5 %   Platelets 157 150 - 440 K/uL   Neutrophils Relative % 79 %   Neutro Abs 10.2 (H) 1.4 - 6.5 K/uL   Lymphocytes Relative 9 %   Lymphs Abs 1.1 1.0 - 3.6 K/uL   Monocytes Relative 12 %   Monocytes Absolute 1.5 (H) 0.2 - 1.0 K/uL   Eosinophils Relative 0 %   Eosinophils Absolute 0.0 0 - 0.7 K/uL   Basophils Relative 0 %   Basophils Absolute 0.0 0 - 0.1 K/uL  Comprehensive metabolic panel     Status: Abnormal   Collection Time: 07/08/16  4:39 PM  Result Value Ref Range   Sodium 129 (L) 135 - 145 mmol/L   Potassium 4.8 3.5 - 5.1 mmol/L   Chloride 98 (L) 101 - 111 mmol/L   CO2 17 (L) 22 - 32 mmol/L   Glucose, Bld 377 (H) 65 - 99 mg/dL   BUN 12 6 - 20 mg/dL   Creatinine, Ser 0.87 0.61 - 1.24 mg/dL   Calcium 8.7 (L) 8.9 - 10.3 mg/dL   Total Protein 6.9 6.5 - 8.1 g/dL   Albumin 3.2 (L) 3.5 - 5.0 g/dL   AST 21 15 - 41 U/L   ALT 99 (H) 17 - 63 U/L   Alkaline Phosphatase 159 (H) 38 - 126 U/L   Total Bilirubin 1.4 (H) 0.3 - 1.2 mg/dL   GFR calc non Af Amer >60 >60 mL/min   GFR calc Af Amer >60 >60 mL/min   Anion gap 14 5 - 15  Troponin I     Status: Abnormal   Collection Time: 07/08/16  4:39 PM  Result Value Ref Range   Troponin I 0.12 (HH) <0.03 ng/mL  CK     Status: Abnormal   Collection Time: 07/08/16  4:39 PM  Result Value Ref Range   Total CK 37 (L) 49 - 397 U/L  Beta-hydroxybutyric acid     Status: Abnormal   Collection Time: 07/08/16  4:39 PM  Result Value Ref Range   Beta-Hydroxybutyric Acid 4.09 (H) 0.05 - 0.27 mmol/L  Glucose, capillary     Status: Abnormal   Collection Time: 07/08/16  7:55 PM   Result Value Ref Range  Glucose-Capillary 304 (H) 65 - 99 mg/dL  Urinalysis complete, with microscopic ([facility] only)     Status: Abnormal   Collection Time: 07/08/16  8:20 PM  Result Value Ref Range   Color, Urine YELLOW (A) YELLOW   APPearance HAZY (A) CLEAR   Glucose, UA >500 (A) NEGATIVE mg/dL   Bilirubin Urine NEGATIVE NEGATIVE   Ketones, ur 2+ (A) NEGATIVE mg/dL   Specific Gravity, Urine 1.029 1.005 - 1.030   Hgb urine dipstick 1+ (A) NEGATIVE   pH 6.0 5.0 - 8.0   Protein, ur 100 (A) NEGATIVE mg/dL   Nitrite POSITIVE (A) NEGATIVE   Leukocytes, UA 2+ (A) NEGATIVE   RBC / HPF 0-5 0 - 5 RBC/hpf   WBC, UA TOO NUMEROUS TO COUNT 0 - 5 WBC/hpf   Bacteria, UA NONE SEEN NONE SEEN   Squamous Epithelial / LPF NONE SEEN NONE SEEN   WBC Clumps PRESENT   Urine Drug Screen, Qualitative ([facility] only)     Status: None   Collection Time: 07/08/16  8:20 PM  Result Value Ref Range   Tricyclic, Ur Screen NONE DETECTED NONE DETECTED   Amphetamines, Ur Screen NONE DETECTED NONE DETECTED   MDMA (Ecstasy)Ur Screen NONE DETECTED NONE DETECTED   Cocaine Metabolite,Ur [place] NONE DETECTED NONE DETECTED   Opiate, Ur Screen NONE DETECTED NONE DETECTED   Phencyclidine (PCP) Ur S NONE DETECTED NONE DETECTED   Cannabinoid 50 Ng, Ur [place] NONE DETECTED NONE DETECTED   Barbiturates, Ur Screen NONE DETECTED NONE DETECTED   Benzodiazepine, Ur Scrn NONE DETECTED NONE DETECTED   Methadone Scn, Ur NONE DETECTED NONE DETECTED  Glucose, capillary     Status: Abnormal   Collection Time: 07/08/16  8:55 PM  Result Value Ref Range   Glucose-Capillary 333 (H) 65 - 99 mg/dL  Glucose, capillary     Status: Abnormal   Collection Time: 07/08/16  9:13 PM  Result Value Ref Range   Glucose-Capillary 336 (H) 65 - 99 mg/dL  MRSA PCR Screening     Status: None   Collection Time: 07/08/16  9:20 PM  Result Value Ref Range   MRSA by PCR NEGATIVE NEGATIVE  Basic metabolic panel     Status: Abnormal   Collection Time: 07/08/16 10:32  PM  Result Value Ref Range   Sodium 132 (L) 135 - 145 mmol/L   Potassium 3.8 3.5 - 5.1 mmol/L   Chloride 101 101 - 111 mmol/L   CO2 22 22 - 32 mmol/L   Glucose, Bld 289 (H) 65 - 99 mg/dL   BUN 13 6 - 20 mg/dL   Creatinine, Ser 0.65 0.61 - 1.24 mg/dL   Calcium 8.2 (L) 8.9 - 10.3 mg/dL   GFR calc non Af Amer >60 >60 mL/min   GFR calc Af Amer >60 >60 mL/min   Anion gap 9 5 - 15  Glucose, capillary     Status: Abnormal   Collection Time: 07/08/16 10:56 PM  Result Value Ref Range   Glucose-Capillary 264 (H) 65 - 99 mg/dL  Glucose, capillary     Status: Abnormal   Collection Time: 07/08/16 11:58 PM  Result Value Ref Range   Glucose-Capillary 234 (H) 65 - 99 mg/dL  Glucose, capillary     Status: Abnormal   Collection Time: 07/09/16  1:01 AM  Result Value Ref Range   Glucose-Capillary 213 (H) 65 - 99 mg/dL  Glucose, capillary     Status: Abnormal   Collection Time: 07/09/16  2:04 AM  Result Value  Ref Range   Glucose-Capillary 180 (H) 65 - 99 mg/dL  Basic metabolic panel     Status: Abnormal   Collection Time: 07/09/16  2:47 AM  Result Value Ref Range   Sodium 133 (L) 135 - 145 mmol/L   Potassium 3.5 3.5 - 5.1 mmol/L   Chloride 105 101 - 111 mmol/L   CO2 22 22 - 32 mmol/L   Glucose, Bld 166 (H) 65 - 99 mg/dL   BUN 15 6 - 20 mg/dL   Creatinine, Ser 0.56 (L) 0.61 - 1.24 mg/dL   Calcium 8.2 (L) 8.9 - 10.3 mg/dL   GFR calc non Af Amer >60 >60 mL/min   GFR calc Af Amer >60 >60 mL/min   Anion gap 6 5 - 15  Glucose, capillary     Status: Abnormal   Collection Time: 07/09/16  2:49 AM  Result Value Ref Range   Glucose-Capillary 161 (H) 65 - 99 mg/dL  Glucose, capillary     Status: Abnormal   Collection Time: 07/09/16  3:58 AM  Result Value Ref Range   Glucose-Capillary 135 (H) 65 - 99 mg/dL  Glucose, capillary     Status: Abnormal   Collection Time: 07/09/16  5:03 AM  Result Value Ref Range   Glucose-Capillary 114 (H) 65 - 99 mg/dL  Glucose, capillary     Status: Abnormal    Collection Time: 07/09/16  6:04 AM  Result Value Ref Range   Glucose-Capillary 135 (H) 65 - 99 mg/dL  Basic metabolic panel     Status: Abnormal   Collection Time: 07/09/16  6:26 AM  Result Value Ref Range   Sodium 133 (L) 135 - 145 mmol/L   Potassium 3.6 3.5 - 5.1 mmol/L   Chloride 104 101 - 111 mmol/L   CO2 23 22 - 32 mmol/L   Glucose, Bld 144 (H) 65 - 99 mg/dL   BUN 15 6 - 20 mg/dL   Creatinine, Ser 0.57 (L) 0.61 - 1.24 mg/dL   Calcium 8.2 (L) 8.9 - 10.3 mg/dL   GFR calc non Af Amer >60 >60 mL/min   GFR calc Af Amer >60 >60 mL/min   Anion gap 6 5 - 15  Troponin I     Status: Abnormal   Collection Time: 07/09/16  6:26 AM  Result Value Ref Range   Troponin I 1.12 (HH) <0.03 ng/mL  Glucose, capillary     Status: Abnormal   Collection Time: 07/09/16  7:08 AM  Result Value Ref Range   Glucose-Capillary 162 (H) 65 - 99 mg/dL  Glucose, capillary     Status: Abnormal   Collection Time: 07/09/16  8:16 AM  Result Value Ref Range   Glucose-Capillary 192 (H) 65 - 99 mg/dL   Dg Chest 2 View  07/08/2016  CLINICAL DATA:  Chest pain for 2 days, initial encounter EXAM: CHEST  2 VIEW COMPARISON:  None. FINDINGS: The heart size and mediastinal contours are within normal limits. Both lungs are clear. The visualized skeletal structures are unremarkable. IMPRESSION: No active cardiopulmonary disease. Electronically Signed   By: Inez Catalina M.D.   On: 07/08/2016 17:14   Dg Tibia/fibula Right  07/08/2016  CLINICAL DATA:  Right knee pain, no known injury, initial encounter EXAM: RIGHT TIBIA AND FIBULA - 2 VIEW COMPARISON:  None. FINDINGS: There is no evidence of fracture or other focal bone lesions. Soft tissues are unremarkable. IMPRESSION: No acute abnormality noted. Electronically Signed   By: Inez Catalina M.D.   On: 07/08/2016 17:23  ASSESSMENT AND PLAN: NonSTEMI with initial troponins being 0.12 and 1.12. Currently is having no chest pain or dyspnea. The patient has been started on aspirin,  statin, and Lovenox. Echocardiogram is pending. He is being treated for DKA. Currently no indication for cardiac cath. If patient continues to remain chest pain-free and pending further troponin levels can plan on outpatient nuclear stress test or coronary CT angiography. Discussed with patient and he would rather proceed with outpatient testing.  Daune Perch, NP 07/09/2016 8:54 AM

## 2016-07-09 NOTE — Progress Notes (Signed)
[facility] [place] NAME: Mark [name]    MR#:  [id number]  DATE OF BIRTH:  January 20, 1981  SUBJECTIVE:   Patient here with DKA and chest Pain. Currently not having chest pain and has not had any chest pain since admission. DKA has resolved.   REVIEW OF SYSTEMS:    Review of Systems  Constitutional: Negative for fever, chills and malaise/fatigue.  HENT: Negative for ear discharge, ear pain, hearing loss, nosebleeds and sore throat.   Eyes: Negative for blurred vision and pain.  Respiratory: Negative for cough, hemoptysis, shortness of breath and wheezing.   Cardiovascular: Negative for chest pain, palpitations and leg swelling.  Gastrointestinal: Negative for nausea, vomiting, abdominal pain, diarrhea and blood in stool.  Genitourinary: Negative for dysuria.  Musculoskeletal: Negative for back pain.  Skin:       Spider bite  Neurological: Negative for dizziness, tremors, speech change, focal weakness, seizures and headaches.  Endo/Heme/Allergies: Does not bruise/bleed easily.  Psychiatric/Behavioral: Negative for depression, suicidal ideas and hallucinations.    Tolerating Diet:yes      DRUG ALLERGIES:   Allergies  Allergen Reactions  . Penicillins Rash    VITALS:  Blood pressure 113/84, pulse 80, temperature 98.3 F (36.8 C), temperature source Oral, resp. rate 17, height 5\' 10"  (1.778 m), weight 59.875 kg (132 lb), SpO2 99 %.  PHYSICAL EXAMINATION:   Physical Exam  Constitutional: He is oriented to person, place, and time. No distress.  thin  HENT:  Head: Normocephalic.  Eyes: No scleral icterus.  Neck: Normal range of motion. Neck supple. No JVD present. No tracheal deviation present.  Cardiovascular: Normal rate, regular rhythm and normal heart sounds.  Exam reveals no gallop and no friction rub.   No murmur heard. Pulmonary/Chest: Effort normal and breath sounds normal. No respiratory distress. He has no wheezes.  He has no rales. He exhibits no tenderness.  Abdominal: Soft. Bowel sounds are normal. He exhibits no distension and no mass. There is no tenderness. There is no rebound and no guarding.  Musculoskeletal: Normal range of motion. He exhibits no edema.  Neurological: He is alert and oriented to person, place, and time.  Skin: Skin is warm. No rash noted.  Right arm covered  Psychiatric: Affect and judgment normal.      LABORATORY PANEL:   CBC  Recent Labs Lab 07/08/16 1639  WBC 12.9*  HGB 15.1  HCT 42.7  PLT 157   ------------------------------------------------------------------------------------------------------------------  Chemistries   Recent Labs Lab 07/08/16 1639  07/09/16 1135  NA 129*  < > 132*  K 4.8  < > 3.5  CL 98*  < > 104  CO2 17*  < > 23  GLUCOSE 377*  < > 210*  BUN 12  < > 15  CREATININE 0.87  < > 0.44*  CALCIUM 8.7*  < > 8.2*  AST 21  --   --   ALT 99*  --   --   ALKPHOS 159*  --   --   BILITOT 1.4*  --   --   < > = values in this interval not displayed. ------------------------------------------------------------------------------------------------------------------  Cardiac Enzymes  Recent Labs Lab 07/08/16 1639 07/09/16 0626 07/09/16 1135  TROPONINI 0.12* 1.12* 0.84*   ------------------------------------------------------------------------------------------------------------------  RADIOLOGY:  Dg Chest 2 View  07/08/2016  CLINICAL DATA:  Chest pain for 2 days, initial encounter EXAM: CHEST  2 VIEW COMPARISON:  None. FINDINGS: The heart size and mediastinal contours are within normal limits.  Both lungs are clear. The visualized skeletal structures are unremarkable. IMPRESSION: No active cardiopulmonary disease. Electronically Signed   By: Inez Catalina M.D.   On: 07/08/2016 17:14   Dg Tibia/fibula Right  07/08/2016  CLINICAL DATA:  Right knee pain, no known injury, initial encounter EXAM: RIGHT TIBIA AND FIBULA - 2 VIEW COMPARISON:   None. FINDINGS: There is no evidence of fracture or other focal bone lesions. Soft tissues are unremarkable. IMPRESSION: No acute abnormality noted. Electronically Signed   By: Inez Catalina M.D.   On: 07/08/2016 17:23     ASSESSMENT AND PLAN:   35 year old male with a history of diabetes who has not had medication for several months due to insurance reasons who presented with chest pain.  1. Non-ST elevation MI: Patient's troponin has increased from admission. Continue Lovenox, aspirin, statin and low-dose beta blocker if blood pressure and heart rate tolerate. Patient will need further cardiac workup, likely cardiac catheterization. Follow-up and a cardiac murmur.  2. DKA: This is resolved. Patient is transitioned to subcutaneous insulin and a diet. Case management consulted for medications at discharge.   3. Spider bite on right arm: Continue clindamycin.      Management plans discussed with the patient and he is in agreement. D.w family CODE STATUS: FULL  TOTAL TIME TAKING CARE OF THIS PATIENT: 30 minutes.     POSSIBLE D/C 2 days, DEPENDING ON CLINICAL CONDITION.   Kiyana Vazguez M.D on 07/09/2016 at 12:24 PM  Between 7am to 6pm - Pager - [phone] After 6pm go to [email or web address] - password [email or web address] [facility]  Office  [phone]  CC: Primary care physician; No primary care provider on file.  Note: This dictation was prepared with Dragon dictation along with smaller phrase technology. Any transcriptional errors that result from this process are unintentional.

## 2016-07-09 NOTE — Care Management (Signed)
Met with patient and his wife at bedside. He has no PCP and is uninsured. Provided patient with a [facility] application. Also gave a list of local practices accepting patients.  He is to have a cath tomorrow due to elevated troponin. DKA resolved.  Patient lives at home with his wife. He is independent, active and requires no adl assistance. Denies issues with transportation. No further discharge needs anticipated at this time.

## 2016-07-09 NOTE — Progress Notes (Signed)
Report called to Anderson Malta, Therapist, sports. Pt transferred via bed and portable tele to unit 2A room 242. Central tele notified and patient verified.

## 2016-07-10 ENCOUNTER — Encounter
Admission: EM | Disposition: A | Discharge status: Home / Self Care | Payer: Self-pay | Source: Home / Self Care | Attending: Internal Medicine

## 2016-07-10 HISTORY — PX: CARDIAC CATHETERIZATION: SHX172

## 2016-07-10 LAB — PROTIME-INR
INR: 0.94
PROTHROMBIN TIME: 12.8 s (ref 11.4–15.0)

## 2016-07-10 LAB — GLUCOSE, CAPILLARY
GLUCOSE-CAPILLARY: 238 mg/dL — AB (ref 65–99)
Glucose-Capillary: 257 mg/dL — ABNORMAL HIGH (ref 65–99)

## 2016-07-10 SURGERY — LEFT HEART CATH AND CORONARY ANGIOGRAPHY
Anesthesia: Moderate Sedation

## 2016-07-10 MED ORDER — MIDAZOLAM HCL 2 MG/2ML IJ SOLN
INTRAMUSCULAR | Status: AC
  Filled 2016-07-10: qty 2

## 2016-07-10 MED ORDER — HEPARIN (PORCINE) IN NACL 2-0.9 UNIT/ML-% IJ SOLN
INTRAMUSCULAR | Status: AC
  Filled 2016-07-10: qty 500

## 2016-07-10 MED ORDER — SODIUM CHLORIDE 0.9% FLUSH: 3.0000 mL | INTRAVENOUS | Status: DC | PRN

## 2016-07-10 MED ORDER — MIDAZOLAM HCL 2 MG/2ML IJ SOLN
INTRAMUSCULAR | Status: DC | PRN
  Administered 2016-07-10: 1 mg via INTRAVENOUS

## 2016-07-10 MED ORDER — FENTANYL CITRATE (PF) 100 MCG/2ML IJ SOLN
INTRAMUSCULAR | Status: DC | PRN
  Administered 2016-07-10: 50 ug via INTRAVENOUS

## 2016-07-10 MED ORDER — FENTANYL CITRATE (PF) 100 MCG/2ML IJ SOLN
INTRAMUSCULAR | Status: AC
  Filled 2016-07-10: qty 2

## 2016-07-10 MED ORDER — INSULIN GLARGINE 100 UNIT/ML ~~LOC~~ SOLN
30.0000 [IU] | Freq: Every day | SUBCUTANEOUS | Status: DC
Start: 2016-07-10 — End: 2016-07-10
  Administered 2016-07-10: 30 [IU] via SUBCUTANEOUS
  Filled 2016-07-10: qty 0.3

## 2016-07-10 MED ORDER — ACETAMINOPHEN 325 MG PO TABS: 650.0000 mg | ORAL_TABLET | ORAL | Status: DC | PRN

## 2016-07-10 MED ORDER — INSULIN ASPART 100 UNIT/ML ~~LOC~~ SOLN: 2.0000 [IU] | Freq: Three times a day (TID) | SUBCUTANEOUS | Status: DC

## 2016-07-10 MED ORDER — ONDANSETRON HCL 4 MG/2ML IJ SOLN: 4.0000 mg | Freq: Four times a day (QID) | INTRAMUSCULAR | Status: DC | PRN

## 2016-07-10 MED ORDER — ASPIRIN 81 MG PO CHEW: 81.0000 mg | CHEWABLE_TABLET | ORAL | Status: DC

## 2016-07-10 MED ORDER — INSULIN GLARGINE 100 UNIT/ML SOLOSTAR PEN: 40.0000 [IU] | PEN_INJECTOR | Freq: Every day | SUBCUTANEOUS | Status: DC

## 2016-07-10 MED ORDER — SODIUM CHLORIDE 0.9% FLUSH: 3.0000 mL | Freq: Two times a day (BID) | INTRAVENOUS | Status: DC

## 2016-07-10 MED ORDER — CLINDAMYCIN HCL 300 MG PO CAPS: 300.0000 mg | ORAL_CAPSULE | Freq: Four times a day (QID) | ORAL | Status: DC

## 2016-07-10 MED ORDER — SODIUM CHLORIDE 0.9 % WEIGHT BASED INFUSION: 1.0000 mL/kg/h | INTRAVENOUS | Status: DC

## 2016-07-10 MED ORDER — SODIUM CHLORIDE 0.9 % IV SOLN: 250.0000 mL | INTRAVENOUS | Status: DC | PRN

## 2016-07-10 MED ORDER — ASPIRIN 81 MG PO CHEW
81.0000 mg | CHEWABLE_TABLET | ORAL | Status: AC
  Administered 2016-07-10: 81 mg via ORAL
  Filled 2016-07-10: qty 1

## 2016-07-10 MED ORDER — SODIUM CHLORIDE 0.9 % WEIGHT BASED INFUSION: 3.0000 mL/kg/h | INTRAVENOUS | Status: DC

## 2016-07-10 MED ORDER — LIVING WELL WITH DIABETES BOOK
Freq: Once | Status: AC
  Administered 2016-07-10: 11:00:00
  Filled 2016-07-10: qty 1

## 2016-07-10 MED ORDER — IOPAMIDOL (ISOVUE-300) INJECTION 61%
INTRAVENOUS | Status: DC | PRN
  Administered 2016-07-10: 110 mL via INTRA_ARTERIAL

## 2016-07-10 MED ORDER — SODIUM CHLORIDE 0.9 % WEIGHT BASED INFUSION
1.0000 mL/kg/h | INTRAVENOUS | Status: DC
  Administered 2016-07-10: 1 mL/kg/h via INTRAVENOUS

## 2016-07-10 SURGICAL SUPPLY — 9 items
CATH INFINITI 5FR ANG PIGTAIL (CATHETERS) ×3 IMPLANT
CATH INFINITI 5FR JL4 (CATHETERS) ×3 IMPLANT
CATH INFINITI JR4 5F (CATHETERS) ×3 IMPLANT
DEVICE CLOSURE MYNXGRIP 5F (Vascular Products) ×3 IMPLANT
KIT MANI 3VAL PERCEP (MISCELLANEOUS) ×3 IMPLANT
NEEDLE PERC 18GX7CM (NEEDLE) ×3 IMPLANT
PACK CARDIAC CATH (CUSTOM PROCEDURE TRAY) ×3 IMPLANT
SHEATH PINNACLE 5F 10CM (SHEATH) ×3 IMPLANT
WIRE EMERALD 3MM-J .035X150CM (WIRE) ×3 IMPLANT

## 2016-07-10 NOTE — Progress Notes (Signed)
To cath lab via bed.

## 2016-07-10 NOTE — Care Management (Signed)
TC to Medication management.  They have Lantus Pen and Novolog vial. Have given patient application to [facility].

## 2016-07-10 NOTE — Progress Notes (Signed)
Inpatient Diabetes Program Recommendations  AACE/ADA: New Consensus Statement on Inpatient Glycemic Control (2015)  Target Ranges:  Prepandial:   less than 140 mg/dL      Peak postprandial:   less than 180 mg/dL (1-2 hours)      Critically ill patients:  140 - 180 mg/dL   Lab Results  Component Value Date   GLUCAP 264* 07/09/2016   HGBA1C 14.2* 07/08/2016    Review of Glycemic Control   Results for FAWKES, PIVARNIK (MRN [id number]) as of 07/10/2016 08:06  Ref. Range 07/09/2016 14:08 07/09/2016 16:14 07/09/2016 17:51 07/09/2016 21:27 07/10/2016 07:25  Glucose-Capillary Latest Ref Range: 65-99 mg/dL 184 (H) 216 (H) 281 (H) 264 (H) 257 (H)   Outpatient Diabetes medications: Levemir 20 units BID, Trulicity A999333 mg weekly (has not taken for months) Current orders for Inpatient glycemic control: Lantus 18 units qday, Novolog sensitive correction scale 0-9 units tid, Novolog 0-5 units qhs  Inpatient Diabetes Program Recommendations:  When patient returns from his procedure this am and is eating, consider adding Novolog 2 units tid with meals to current medication orders.  Fasting blood sugar 257mg /dl- Consider increasing Lantus to 30 units qday (10am)  HgbA1C: 14.2%  Gentry Fitz, RN, [place], [place], CDE Diabetes Coordinator Inpatient Diabetes Program  [phone] (Team Pager) [phone] ([place]) 07/10/2016 8:09 AM

## 2016-07-10 NOTE — Progress Notes (Signed)
Mark [name] was admitted to the Hospital on 07/08/2016 and Discharged  07/10/2016 and should be excused from work/school   for 7  days starting 07/08/2016 , may return to work/school without any restrictions.  Call Bettey Costa MD with questions.  Mark [name] M.D on 07/10/2016,at 11:10 AM  [place] at [facility]  [phone]

## 2016-07-10 NOTE — Progress Notes (Signed)
Inpatient Diabetes Program Recommendations  AACE/ADA: New Consensus Statement on Inpatient Glycemic Control (2015)  Target Ranges:  Prepandial:   less than 140 mg/dL      Peak postprandial:   less than 180 mg/dL (1-2 hours)      Critically ill patients:  140 - 180 mg/dL   Lab Results  Component Value Date   GLUCAP 257* 07/10/2016   HGBA1C 14.2* 07/08/2016    Outpatient Diabetes medications: Levemir 20 units BID, Trulicity A999333 mg weekly (has not taken for months)  Medication management clinic has Lantus pens and Novolog vials.  Consider ordering Lantus 40 units qhs. He complains of it hurting so once a day will decrease the number of times he has to poke himself.   He has Trulicity at home- recommend he start it.  Has a meter and strips at home.  As long as supplies are within date and have not been open more than 30 days- patient can use them. Should check sugars fasting and 2 hours post supper.    He has been on 60 units Levemir in the past but had "low sugars" 70-80mg /dl.  I have stressed the importance of follow [facility] with [facility] so [facility] can adjust doses- he will likely need Novolog as well.  I reviewed the potential complications of diabetes, including but not limited to impotence, renal failure, heart attacks, strokes, blindness and gastroparesis.  He admits to already having neuropathy but he is not consistently taking his Gabapentin.   Refused dietitian referral during this admission.  Living Well with Diabetes ordered.  His mother and wife were present during our discussion.  His mother works at [employer] and has been a patient of mine.  I have encouraged her to call me if the patient has questions. HIPPA code word: Derek Mound, RN, [place], [place], CDE Diabetes Coordinator Inpatient Diabetes Program  [phone] (Team Pager) [phone] ([place]) 07/10/2016 10:55 AM

## 2016-07-10 NOTE — Progress Notes (Signed)
SUBJECTIVE: No further cp   Filed Vitals:   07/09/16 2030 07/10/16 0415 07/10/16 0732 07/10/16 0757  BP: 116/71 118/73 125/79 120/80  Pulse: 81 89 85 81  Temp: 97.4 F (36.3 C) 97.6 F (36.4 C) 98.7 F (37.1 C) 98.5 F (36.9 C)  TempSrc:   Oral   Resp: 16 16  16   Height:      Weight:      SpO2: 98% 99% 97% 98%    Intake/Output Summary (Last 24 hours) at 07/10/16 0831 Last data filed at 07/10/16 0747  Gross per 24 hour  Intake 897.18 ml  Output   1750 ml  Net -852.82 ml    LABS: Basic Metabolic Panel:  Recent Labs  07/09/16 0626 07/09/16 1135  NA 133* 132*  K 3.6 3.5  CL 104 104  CO2 23 23  GLUCOSE 144* 210*  BUN 15 15  CREATININE 0.57* 0.44*  CALCIUM 8.2* 8.2*   Liver Function Tests:  Recent Labs  07/08/16 1639  AST 21  ALT 99*  ALKPHOS 159*  BILITOT 1.4*  PROT 6.9  ALBUMIN 3.2*   No results for input(s): LIPASE, AMYLASE in the last 72 hours. CBC:  Recent Labs  07/08/16 1639  WBC 12.9*  NEUTROABS 10.2*  HGB 15.1  HCT 42.7  MCV 87.4  PLT 157   Cardiac Enzymes:  Recent Labs  07/08/16 1639 07/09/16 0626 07/09/16 1135  CKTOTAL 37*  --   --   TROPONINI 0.12* 1.12* 0.84*   BNP: Invalid input(s): POCBNP D-Dimer: No results for input(s): DDIMER in the last 72 hours. Hemoglobin A1C:  Recent Labs  07/08/16 2232  HGBA1C 14.2*   Fasting Lipid Panel:  Recent Labs  07/09/16 1135  CHOL 80  HDL 33*  LDLCALC 33  TRIG 71  CHOLHDL 2.4   Thyroid Function Tests: No results for input(s): TSH, T4TOTAL, T3FREE, THYROIDAB in the last 72 hours.  Invalid input(s): FREET3 Anemia Panel: No results for input(s): VITAMINB12, FOLATE, FERRITIN, TIBC, IRON, RETICCTPCT in the last 72 hours.   PHYSICAL EXAM General: Well developed, well nourished, in no acute distress HEENT:  Normocephalic and atramatic Neck:  No JVD.  Lungs: Clear bilaterally to auscultation and percussion. Heart: HRRR . Normal S1 and S2 without gallops or murmurs.   Abdomen: Bowel sounds are positive, abdomen soft and non-tender  Msk:  Back normal, normal gait. Normal strength and tone for age. Extremities: No clubbing, cyanosis or edema.   Neuro: Alert and oriented X 3. Psych:  Good affect, responds appropriately  TELEMETRY: NSR  ASSESSMENT AND PLAN: NSR , doing cath today.  Active Problems:   NSTEMI (non-ST elevated myocardial infarction) ([place])   DKA (diabetic ketoacidoses) ([place])    Lorian Yaun A, MD, [facility] 07/10/2016 8:31 AM

## 2016-07-10 NOTE — Progress Notes (Signed)
Pt returned from cath lab, A&O x 3.  No distress on ra.  Rt groin site dry and intact, pulses equal bil.  Denies need at this time.  Family at bedside.  IVF infusing well lt ac.

## 2016-07-10 NOTE — Progress Notes (Signed)
Atypical chest pain with normal coronaries and normal EF. Musckuloskeletal cp.

## 2016-07-10 NOTE — Discharge Summary (Signed)
[place] [facility] [place] NAME: Mark [name]    MR#:  [id number]  DATE OF BIRTH:  1981-10-31  DATE OF ADMISSION:  07/08/2016 ADMITTING PHYSICIAN: Lytle Butte, MD  DATE OF DISCHARGE: 07/10/2016  PRIMARY CARE PHYSICIAN: No primary care provider on file.    ADMISSION DIAGNOSIS:  Pain aggravated by walking [R52]  DISCHARGE DIAGNOSIS:  Active Problems:   Elevated troponin (HCC)   DKA (diabetic ketoacidoses) ([place])   SECONDARY DIAGNOSIS:   Past Medical History  Diagnosis Date  . Diabetes mellitus without complication [facility])     HOSPITAL COURSE:   35 year old male with a history of diabetes who has not had medication for several months due to insurance reasons who presented with chest pain.  1. Elevated troponin:Patient was initially treated for non-ST elevation MI. Troponin went to 1.1. He underwent cardiac catheterization which showed normal coronary arteries. This is not non-STEMI. This is due to demand ischemia from severe DKA.  2. DKA: This has resolved. He was initially placed on DKA protocol. This and his subcutaneous insulin. Diabetes coordinator was consulted. She recommended changing patient to Lantus. Case management assisted with discharge medications.   3. Spider bite on right arm: He will Continue clindamycin. This seems to be improving.  4. Right leg pain: It appears that this is a tendinopathy there is no evidence on clinical examination that this is a ligament tear.. Lower extreme Doppler was negative for DVT. I am recommending topical analgesic and weightbearing as tolerated.   DISCHARGE CONDITIONS AND DIET:   Stable for discharge and other diet  CONSULTS OBTAINED:  Treatment Team:  Lytle Butte, MD Dionisio David, MD  DRUG ALLERGIES:   Allergies  Allergen Reactions  . Penicillins Rash    DISCHARGE MEDICATIONS:   Current Discharge Medication List    START taking these medications   Details  clindamycin  (CLEOCIN) 300 MG capsule Take 1 capsule (300 mg total) by mouth every 6 (six) hours. Qty: 12 capsule, Refills: 0    Insulin Glargine (LANTUS) 100 UNIT/ML Solostar Pen Inject 40 Units into the skin daily at 10 pm. Qty: 15 mL, Refills: 0      CONTINUE these medications which have NOT CHANGED   Details  DULoxetine (CYMBALTA) 20 MG capsule Take 20 mg by mouth daily.    gabapentin (NEURONTIN) 100 MG capsule Take 200 mg by mouth 3 (three) times daily.      STOP taking these medications     Dulaglutide (TRULICITY) [id number] SOPN      insulin detemir (LEVEMIR) 100 unit/ml SOLN               Today   CHIEF COMPLAINT:   Patient is status post cardiac catheterization. He has no chest pain.   VITAL SIGNS:  Blood pressure 122/67, pulse 80, temperature 98.4 F (36.9 C), temperature source Oral, resp. rate 18, height 5\' 10"  (1.778 m), weight 59.149 kg (130 lb 6.4 oz), SpO2 99 %.   REVIEW OF SYSTEMS:  Review of Systems  Constitutional: Negative for fever, chills and malaise/fatigue.  HENT: Negative for ear discharge, ear pain, hearing loss, nosebleeds and sore throat.   Eyes: Negative for blurred vision and pain.  Respiratory: Negative for cough, hemoptysis, shortness of breath and wheezing.   Cardiovascular: Negative for chest pain, palpitations and leg swelling.  Gastrointestinal: Negative for nausea, vomiting, abdominal pain, diarrhea and blood in stool.  Genitourinary: Negative for dysuria.  Musculoskeletal: Negative for back pain.  Right leg pain  Neurological: Negative for dizziness, tremors, speech change, focal weakness, seizures and headaches.  Endo/Heme/Allergies: Does not bruise/bleed easily.  Psychiatric/Behavioral: Negative for depression, suicidal ideas and hallucinations.     PHYSICAL EXAMINATION:  GENERAL:  35 y.o.-year-old patient lying in the bed with no acute distress.  NECK:  Supple, no jugular venous distention. No thyroid enlargement, no  tenderness.  LUNGS: Normal breath sounds bilaterally, no wheezing, rales,rhonchi  No use of accessory muscles of respiration.  CARDIOVASCULAR: S1, S2 normal. No murmurs, rubs, or gallops.  ABDOMEN: Soft, non-tender, non-distended. Bowel sounds present. No organomegaly or mass.  EXTREMITIES: No pedal edema, cyanosis, or clubbing.  PSYCHIATRIC: The patient is alert and oriented x 3.  SKIN: Possible spider bite has improved no area of induration or erythema.  DATA REVIEW:   CBC  Recent Labs Lab 07/08/16 1639  WBC 12.9*  HGB 15.1  HCT 42.7  PLT 157    Chemistries   Recent Labs Lab 07/08/16 1639  07/09/16 1135  NA 129*  < > 132*  K 4.8  < > 3.5  CL 98*  < > 104  CO2 17*  < > 23  GLUCOSE 377*  < > 210*  BUN 12  < > 15  CREATININE 0.87  < > 0.44*  CALCIUM 8.7*  < > 8.2*  AST 21  --   --   ALT 99*  --   --   ALKPHOS 159*  --   --   BILITOT 1.4*  --   --   < > = values in this interval not displayed.  Cardiac Enzymes  Recent Labs Lab 07/08/16 1639 07/09/16 0626 07/09/16 1135  TROPONINI 0.12* 1.12* 0.84*    Microbiology Results  @[email or web address] @  RADIOLOGY:  Dg Chest 2 View  07/08/2016  CLINICAL DATA:  Chest pain for 2 days, initial encounter EXAM: CHEST  2 VIEW COMPARISON:  None. FINDINGS: The heart size and mediastinal contours are within normal limits. Both lungs are clear. The visualized skeletal structures are unremarkable. IMPRESSION: No active cardiopulmonary disease. Electronically Signed   By: Inez Catalina M.D.   On: 07/08/2016 17:14   Dg Tibia/fibula Right  07/08/2016  CLINICAL DATA:  Right knee pain, no known injury, initial encounter EXAM: RIGHT TIBIA AND FIBULA - 2 VIEW COMPARISON:  None. FINDINGS: There is no evidence of fracture or other focal bone lesions. Soft tissues are unremarkable. IMPRESSION: No acute abnormality noted. Electronically Signed   By: Inez Catalina M.D.   On: 07/08/2016 17:23   US Venous Img Lower Unilateral Right  07/09/2016   CLINICAL DATA:  Acute right leg pain and edema for 1 week EXAM: RIGHT LOWER EXTREMITY VENOUS DOPPLER ULTRASOUND TECHNIQUE: Gray-scale sonography with graded compression, as well as color Doppler and duplex ultrasound were performed to evaluate the lower extremity deep venous systems from the level of the common femoral vein and including the common femoral, femoral, profunda femoral, popliteal and calf veins including the posterior tibial, peroneal and gastrocnemius veins when visible. The superficial great saphenous vein was also interrogated. Spectral Doppler was utilized to evaluate flow at rest and with distal augmentation maneuvers in the common femoral, femoral and popliteal veins. COMPARISON:  None. FINDINGS: Contralateral Common Femoral Vein: Respiratory phasicity is normal and symmetric with the symptomatic side. No evidence of thrombus. Normal compressibility. Common Femoral Vein: No evidence of thrombus. Normal compressibility, respiratory phasicity and response to augmentation. Saphenofemoral Junction: No evidence of thrombus. Normal compressibility and flow on color Doppler  imaging. Profunda Femoral Vein: No evidence of thrombus. Normal compressibility and flow on color Doppler imaging. Femoral Vein: No evidence of thrombus. Normal compressibility, respiratory phasicity and response to augmentation. Popliteal Vein: No evidence of thrombus. Normal compressibility, respiratory phasicity and response to augmentation. Calf Veins: No evidence of thrombus. Normal compressibility and flow on color Doppler imaging. Superficial Great Saphenous Vein: No evidence of thrombus. Normal compressibility and flow on color Doppler imaging. Venous Reflux:  None. Other Findings:  None. IMPRESSION: No evidence of deep venous thrombosis. Electronically Signed   By: Jerilynn Mages.  Shick M.D.   On: 07/09/2016 16:51      Management plans discussed with the patient and he is in agreement. Stable for discharge home  [facility]  CODE STATUS:     Code Status Orders        Start     Ordered   07/08/16 1823  Full code   Continuous     07/08/16 1822    Code Status History    Date Active Date Inactive Code Status Order ID Comments User Context   This patient has a current code status but no historical code status.      TOTAL TIME TAKING CARE OF THIS PATIENT: 36 minutes.    Note: This dictation was prepared with Dragon dictation along with smaller phrase technology. Any transcriptional errors that result from this process are unintentional.  Ivet Guerrieri M.D on 07/10/2016 at 12:09 PM  Between 7am to 6pm - Pager - [phone] After 6pm go to [email or web address] - password [email or web address] [facility]  Office  [phone]  CC: Primary care physician; No primary care provider on file.

## 2016-07-10 NOTE — Progress Notes (Signed)
Pt discharged to home via wc.  Instructions and rx(Insulin & Cleocin) given to pt.  Questions answered.  No distress.

## 2016-07-11 ENCOUNTER — Encounter: Payer: Self-pay | Admitting: Cardiovascular Disease

## 2016-07-25 ENCOUNTER — Ambulatory Visit: Payer: Self-pay

## 2016-07-29 ENCOUNTER — Ambulatory Visit: Payer: Self-pay | Admitting: Urology

## 2016-08-06 ENCOUNTER — Ambulatory Visit
Admission: EM | Admit: 2016-08-06 | Discharge: 2016-08-06 | Disposition: A | Discharge status: Home / Self Care | Payer: Self-pay | Attending: Family Medicine | Admitting: Family Medicine

## 2016-08-06 DIAGNOSIS — N390 Urinary tract infection, site not specified: Secondary | ICD-10-CM

## 2016-08-06 DIAGNOSIS — Z880 Allergy status to penicillin: Secondary | ICD-10-CM | POA: Insufficient documentation

## 2016-08-06 DIAGNOSIS — Z87891 Personal history of nicotine dependence: Secondary | ICD-10-CM | POA: Insufficient documentation

## 2016-08-06 DIAGNOSIS — E119 Type 2 diabetes mellitus without complications: Secondary | ICD-10-CM | POA: Insufficient documentation

## 2016-08-06 DIAGNOSIS — Z794 Long term (current) use of insulin: Secondary | ICD-10-CM | POA: Insufficient documentation

## 2016-08-06 DIAGNOSIS — I252 Old myocardial infarction: Secondary | ICD-10-CM | POA: Insufficient documentation

## 2016-08-06 LAB — URINALYSIS COMPLETE WITH MICROSCOPIC (ARMC ONLY)
Bilirubin Urine: NEGATIVE
Glucose, UA: 250 mg/dL — AB
KETONES UR: NEGATIVE mg/dL
NITRITE: NEGATIVE
PH: 7.5 (ref 5.0–8.0)
PROTEIN: 100 mg/dL — AB
SPECIFIC GRAVITY, URINE: 1.02 (ref 1.005–1.030)
Squamous Epithelial / LPF: NONE SEEN

## 2016-08-06 MED ORDER — CIPROFLOXACIN HCL 500 MG PO TABS: 500.0000 mg | ORAL_TABLET | Freq: Two times a day (BID) | ORAL | 0 refills | Status: DC

## 2016-08-06 MED ORDER — CIPROFLOXACIN HCL 500 MG PO TABS
500.0000 mg | ORAL_TABLET | Freq: Once | ORAL | Status: AC
  Administered 2016-08-06: 500 mg via ORAL

## 2016-08-06 NOTE — ED Provider Notes (Signed)
[facility]    CSN: [id number] Arrival date & time: 08/06/16  1147  First Provider Contact:  None       History   Chief Complaint Chief Complaint  Patient presents with  . Back Pain    HPI Mark [name] is a 35 y.o. male.   HPI:  Patient presents today with symptoms of urinary frequency, dysuria, generalized lower back pain. Patient is diabetic on insulin. He states that his A1c is usually high like 14. He admits to me that he is checking his blood sugar twice daily and taking insulin as instructed. He denies any fever, chills, abdominal pain, severe headache, chest pain, shortness of breath, nausea, vomiting. He denies any penile discharge and does not wish to be tested for STDs.  Past Medical History:  Diagnosis Date  . Diabetes mellitus without complication [facility])     Patient Active Problem List   Diagnosis Date Noted  . NSTEMI (non-ST elevated myocardial infarction) ([place]) 07/08/2016  . DKA (diabetic ketoacidoses) ([place]) 07/08/2016    Past Surgical History:  Procedure Laterality Date  . CARDIAC CATHETERIZATION N/A 07/10/2016   Procedure: Left Heart Cath and Coronary Angiography;  Surgeon: Dionisio David, MD;  Location: [place] [facility];  Service: Cardiovascular;  Laterality: N/A;       Home Medications    Prior to Admission medications   Medication Sig Start Date End Date Taking? Authorizing Provider  DULoxetine (CYMBALTA) 20 MG capsule Take 20 mg by mouth daily.   Yes Historical Provider, MD  gabapentin (NEURONTIN) 100 MG capsule Take 200 mg by mouth 3 (three) times daily.   Yes Historical Provider, MD  Insulin Glargine (LANTUS) 100 UNIT/ML Solostar Pen Inject 40 Units into the skin daily at 10 pm. 07/10/16  Yes Bettey Costa, MD  clindamycin (CLEOCIN) 300 MG capsule Take 1 capsule (300 mg total) by mouth every 6 (six) hours. 07/10/16   Bettey Costa, MD    Family History Family History  Problem Relation Age of Onset  . Diabetes Other     Social  History Social History  Substance Use Topics  . Smoking status: Former Research scientist (life sciences)  . Smokeless tobacco: Never Used  . Alcohol use No     Allergies   Penicillins   Review of Systems Review of Systems:  Negative except mentioned above.   Physical Exam Triage Vital Signs ED Triage Vitals  Enc Vitals Group     BP 08/06/16 1210 121/78     Pulse Rate 08/06/16 1210 80     Resp 08/06/16 1210 18     Temp 08/06/16 1210 98.1 F (36.7 C)     Temp Source 08/06/16 1210 Oral     SpO2 08/06/16 1210 100 %     Weight 08/06/16 1210 135 lb (61.2 kg)     Height 08/06/16 1210 5\' 10"  (1.778 m)     Head Circumference --      Peak Flow --      Pain Score 08/06/16 1211 5     Pain Loc --      Pain Edu? --      Excl. in [place]? --    No data found.   Updated Vital Signs BP 121/78 (BP Location: Left Arm)   Pulse 80   Temp 98.1 F (36.7 C) (Oral)   Resp 18   Ht 5\' 10"  (1.778 m)   Wt 135 lb (61.2 kg)   SpO2 100%   BMI 19.37 kg/m  Physical Exam:  GENERAL: NAD HEENT: no pharyngeal erythema, no exudate RESP: CTA B CARD: RRR ABD: +BS, NT, no flank tenderness appreciated NEURO: CN II-XII grossly intact    UC Treatments / Results  Labs (all labs ordered are listed, but only abnormal results are displayed) Labs Reviewed  URINALYSIS COMPLETEWITH MICROSCOPIC ([facility] ONLY) - Abnormal; Notable for the following:       Result Value   APPearance CLOUDY (*)    Glucose, UA 250 (*)    Hgb urine dipstick MODERATE (*)    Protein, ur 100 (*)    Leukocytes, UA MODERATE (*)    Bacteria, UA FEW (*)    All other components within normal limits    EKG  EKG Interpretation None       Radiology No results found.  Procedures Procedures (including critical care time)  Medications Ordered in UC Medications - No data to display   Initial Impression / Assessment and Plan / UC Course  I have reviewed the triage vital signs and the nursing notes.  Pertinent labs & imaging results that  were available during my care of the patient were reviewed by me and considered in my medical decision making (see chart for details).  Clinical Course   A/P:  UTI-  Urine analysis suggestive of UTI, will send urine for culture, I've asked the patient to monitor his blood sugars closely and supplement with insulin as needed, encouraged rest and hydration, if his symptoms were to worsen I have asked that he go to the ER,  Patient addresses understanding of this. Patient was given Cipro 500 mg times one dose here in the office before leaving. STD testing was not done per request of patient.  Final Clinical Impressions(s) / UC Diagnoses   Final diagnoses:  None    New Prescriptions New Prescriptions   No medications on file     Paulina Fusi, MD 08/06/16 1259

## 2016-08-06 NOTE — Discharge Instructions (Signed)
Monitor blood sugars closely, supplement with insulin, stay hydrated, if symptoms worsen go to the ER as discussed.

## 2016-08-06 NOTE — ED Triage Notes (Signed)
Patient complains of lower back pain and burning while urinating.

## 2016-08-08 LAB — URINE CULTURE

## 2016-11-07 DIAGNOSIS — N63 Unspecified lump in unspecified breast: Secondary | ICD-10-CM

## 2016-11-07 HISTORY — DX: Unspecified lump in unspecified breast: N63.0

## 2017-03-09 IMAGING — CR DG CHEST 2V
2 series · 2 of 2 positions shown · non-contrast
Comparison: None.

CLINICAL DATA: Chest pain for 2 days, initial encounter

EXAM:
CHEST  2 VIEW

[chest pa]
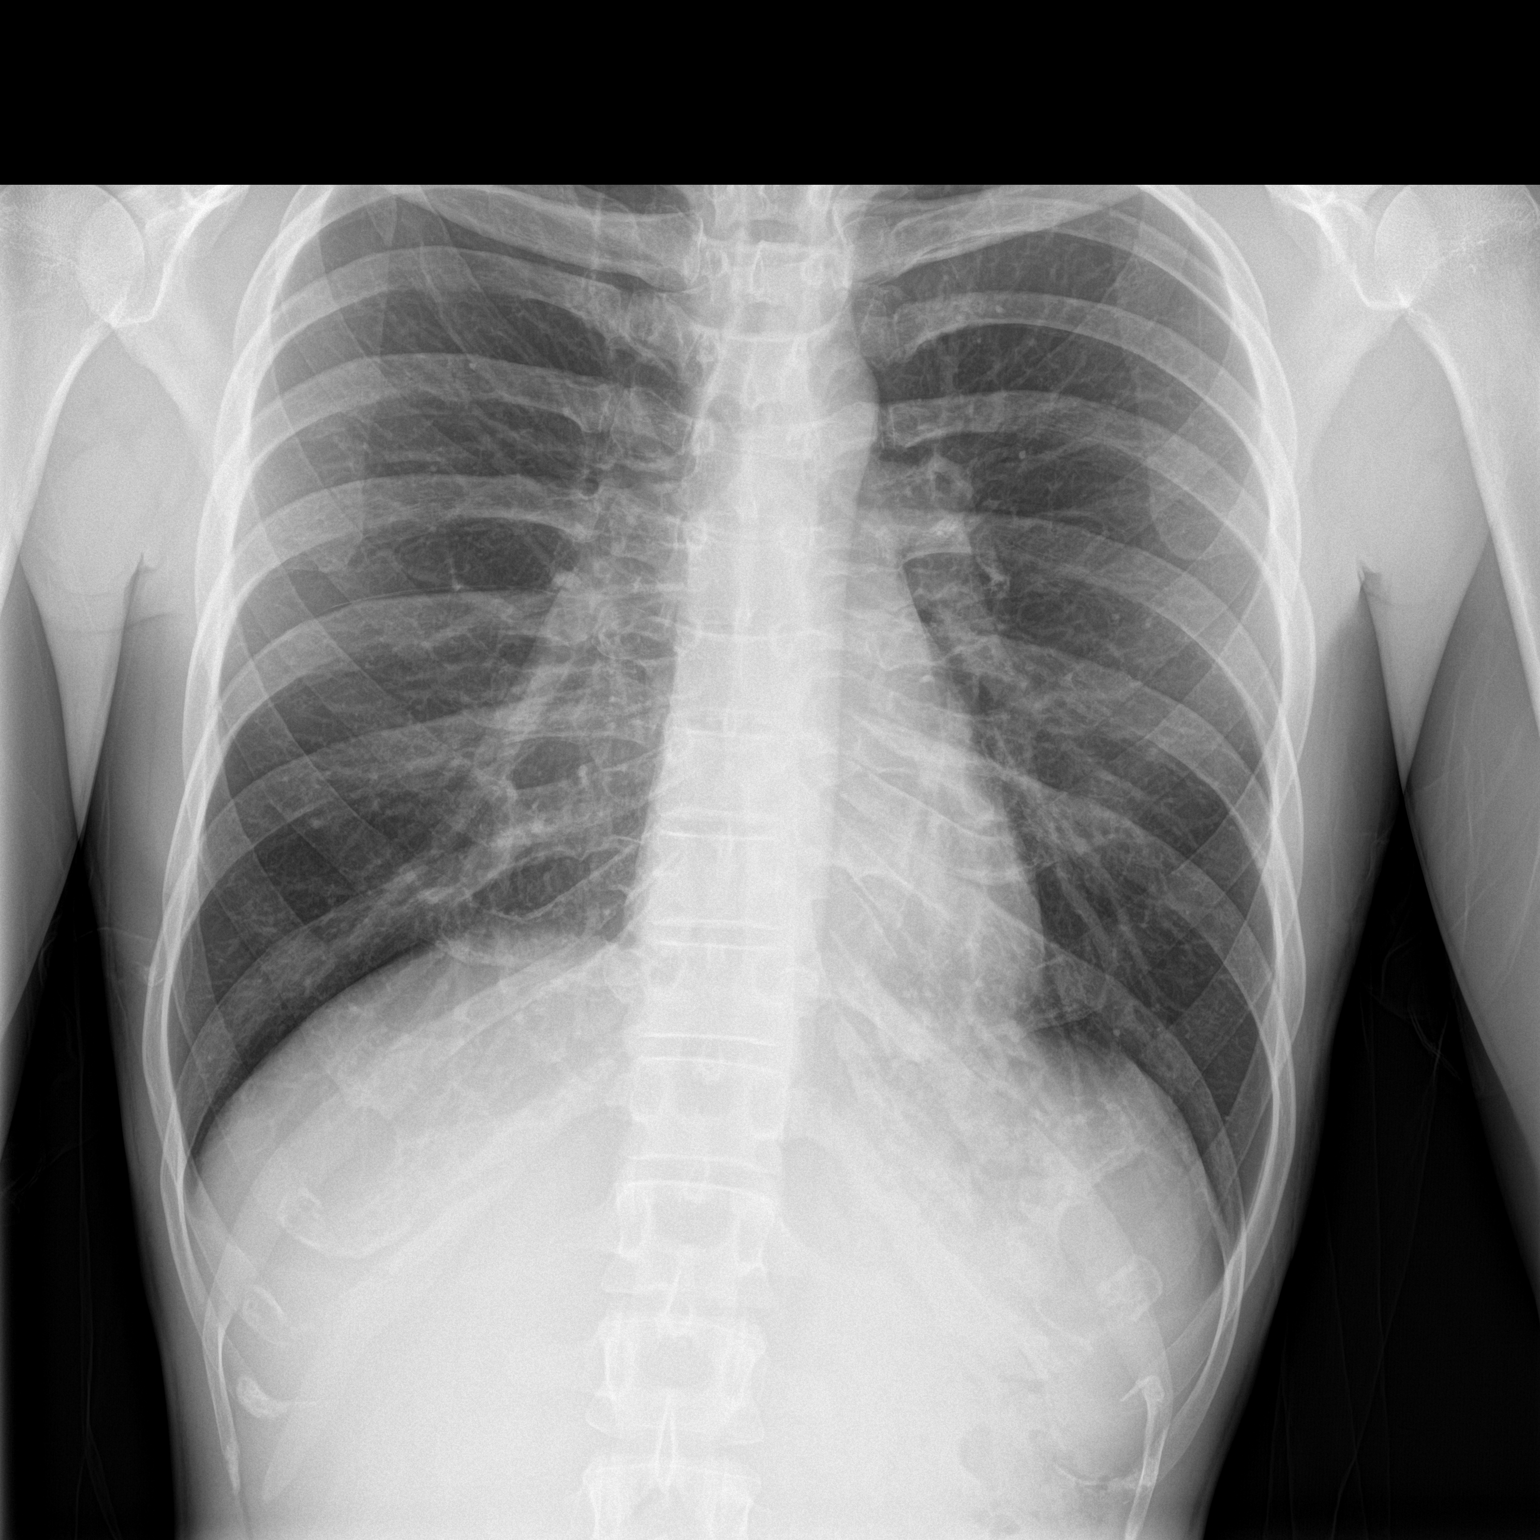

[chest lat]
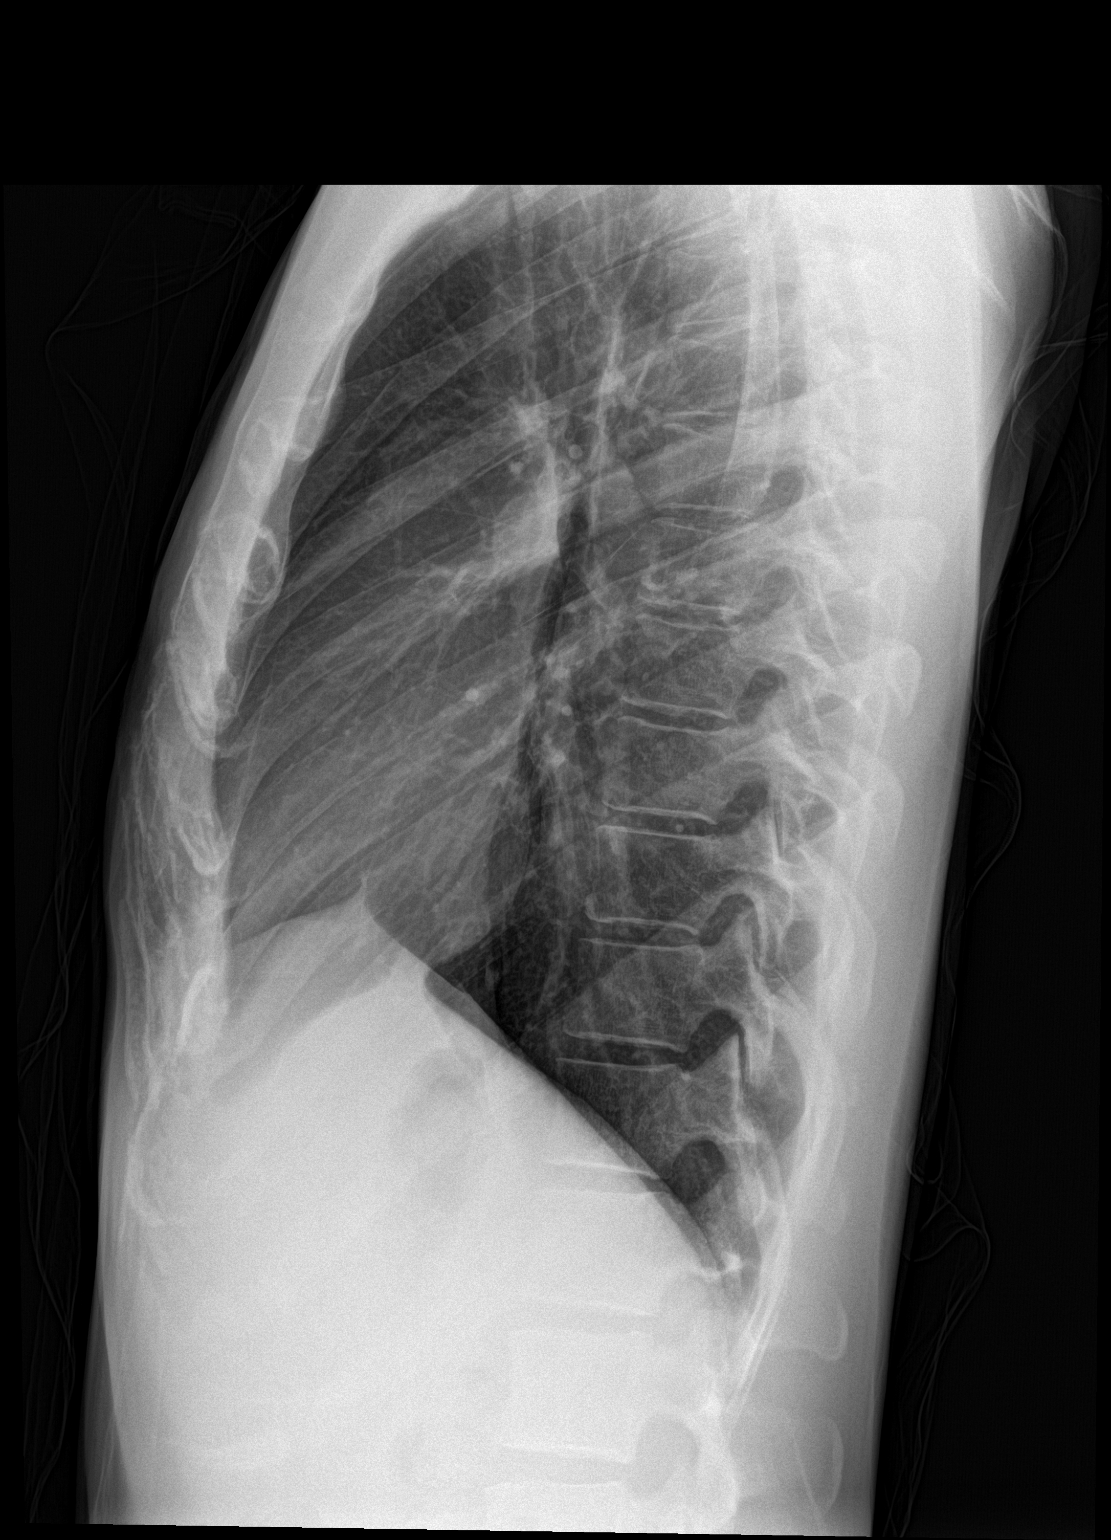

[2 of 2 positions shown; findings below may reference images not displayed]

FINDINGS: The heart size and mediastinal contours are within normal limits.
Both lungs are clear. The visualized skeletal structures are
unremarkable.
IMPRESSION: No active cardiopulmonary disease.

## 2017-05-04 ENCOUNTER — Encounter: Payer: Self-pay | Admitting: Emergency Medicine

## 2017-05-04 ENCOUNTER — Emergency Department
Admission: EM | Admit: 2017-05-04 | Discharge: 2017-05-04 | Disposition: A | Discharge status: Home / Self Care | Payer: Self-pay | Attending: Emergency Medicine | Admitting: Emergency Medicine

## 2017-05-04 DIAGNOSIS — Z79899 Other long term (current) drug therapy: Secondary | ICD-10-CM | POA: Insufficient documentation

## 2017-05-04 DIAGNOSIS — Z87891 Personal history of nicotine dependence: Secondary | ICD-10-CM | POA: Insufficient documentation

## 2017-05-04 DIAGNOSIS — Y929 Unspecified place or not applicable: Secondary | ICD-10-CM | POA: Insufficient documentation

## 2017-05-04 DIAGNOSIS — Y939 Activity, unspecified: Secondary | ICD-10-CM | POA: Insufficient documentation

## 2017-05-04 DIAGNOSIS — Z794 Long term (current) use of insulin: Secondary | ICD-10-CM | POA: Insufficient documentation

## 2017-05-04 DIAGNOSIS — S0501XA Injury of conjunctiva and corneal abrasion without foreign body, right eye, initial encounter: Secondary | ICD-10-CM | POA: Insufficient documentation

## 2017-05-04 DIAGNOSIS — E119 Type 2 diabetes mellitus without complications: Secondary | ICD-10-CM | POA: Insufficient documentation

## 2017-05-04 DIAGNOSIS — X58XXXA Exposure to other specified factors, initial encounter: Secondary | ICD-10-CM | POA: Insufficient documentation

## 2017-05-04 DIAGNOSIS — Y990 Civilian activity done for income or pay: Secondary | ICD-10-CM | POA: Insufficient documentation

## 2017-05-04 MED ORDER — FLUORESCEIN SODIUM 0.6 MG OP STRP
ORAL_STRIP | OPHTHALMIC | Status: AC
  Filled 2017-05-04: qty 1

## 2017-05-04 MED ORDER — GENTAMICIN SULFATE 0.3 % OP OINT: TOPICAL_OINTMENT | Freq: Three times a day (TID) | OPHTHALMIC | 0 refills | Status: DC

## 2017-05-04 MED ORDER — EYE WASH OPHTH SOLN
OPHTHALMIC | Status: AC
  Filled 2017-05-04: qty 118

## 2017-05-04 MED ORDER — TETRACAINE HCL 0.5 % OP SOLN
OPHTHALMIC | Status: AC
  Filled 2017-05-04: qty 2

## 2017-05-04 NOTE — ED Triage Notes (Signed)
Patient presents to the ED with pain to right eye since Friday.  Patient denies eye injury.  Patient states, "I don't know whether I got something in it or not."  Eye appears slightly reddened and patient reports photophobia.  Patient is in no obvious distress at this time.

## 2017-05-04 NOTE — ED Provider Notes (Signed)
[facility] Emergency Department Provider Note   ____________________________________________   First MD Initiated Contact with Patient 05/04/17 1116     (approximate)  I have reviewed the triage vital signs and the nursing notes.   HISTORY  Chief Complaint Eye Pain    HPI Mark [name] is a 36 y.o. male patient complaining of right eye pain for 3 days. Patient state he believes got into his eye at work. Patient denies loss of vision but state he is photophobic.Patient rates his pain as a 5/10. Patient described a pain as "achy". No palliative measures for complaint. Patient does not wear contact lenses.   Past Medical History:  Diagnosis Date  . Diabetes mellitus without complication [facility])     Patient Active Problem List   Diagnosis Date Noted  . NSTEMI (non-ST elevated myocardial infarction) ([place]) 07/08/2016  . DKA (diabetic ketoacidoses) ([place]) 07/08/2016    Past Surgical History:  Procedure Laterality Date  . CARDIAC CATHETERIZATION N/A 07/10/2016   Procedure: Left Heart Cath and Coronary Angiography;  Surgeon: Dionisio David, MD;  Location: [place] [facility];  Service: Cardiovascular;  Laterality: N/A;    Prior to Admission medications   Medication Sig Start Date End Date Taking? Authorizing Provider  ciprofloxacin (CIPRO) 500 MG tablet Take 1 tablet (500 mg total) by mouth every 12 (twelve) hours. 08/06/16   Paulina Fusi, MD  DULoxetine (CYMBALTA) 20 MG capsule Take 20 mg by mouth daily.    [provider]  gabapentin (NEURONTIN) 100 MG capsule Take 200 mg by mouth 3 (three) times daily.    [provider]  gentamicin (GARAMYCIN) 0.3 % ophthalmic ointment Place into the right eye 3 (three) times daily. 05/04/17   Sable Feil, PA-C  Insulin Glargine (LANTUS) 100 UNIT/ML Solostar Pen Inject 40 Units into the skin daily at 10 pm. 07/10/16   Bettey Costa, MD    Allergies Penicillins  Family History  Problem Relation  Age of Onset  . Diabetes Other     Social History Social History  Substance Use Topics  . Smoking status: Former Research scientist (life sciences)  . Smokeless tobacco: Never Used  . Alcohol use No    Review of Systems  Constitutional: No fever/chills Eyes: No visual changes. ENT: No sore throat. Cardiovascular: Denies chest pain. Respiratory: Denies shortness of breath. Gastrointestinal: No abdominal pain.  No nausea, no vomiting.  No diarrhea.  No constipation. Genitourinary: Negative for dysuria. Musculoskeletal: Negative for back pain. Skin: Negative for rash. Neurological: Negative for headaches, focal weakness or numbness. Endocrine:Diabetes ____________________________________________   PHYSICAL EXAM:  VITAL SIGNS: ED Triage Vitals  Enc Vitals Group     BP 05/04/17 1106 120/84     Pulse Rate 05/04/17 1106 93     Resp 05/04/17 1106 18     Temp 05/04/17 1106 98 F (36.7 C)     Temp Source 05/04/17 1106 Oral     SpO2 05/04/17 1106 99 %     Weight 05/04/17 1031 140 lb (63.5 kg)     Height 05/04/17 1031 5\' 10"  (1.778 m)     Head Circumference --      Peak Flow --      Pain Score 05/04/17 1031 5     Pain Loc --      Pain Edu? --      Excl. in [place]? --     Constitutional: Alert and oriented. Well appearing and in no acute distress. Eyes: Conjunctivae are normal. PERRL. EOMI. Head:  Atraumatic. Nose: No congestion/rhinnorhea. Mouth/Throat: Mucous membranes are moist.  Oropharynx non-erythematous. Neck: No stridor.  No cervical spine tenderness to palpation. Hematological/Lymphatic/Immunilogical: No cervical lymphadenopathy. Cardiovascular: Normal rate, regular rhythm. Grossly normal heart sounds.  Good peripheral circulation. Respiratory: Normal respiratory effort.  No retractions. Lungs CTAB. Gastrointestinal: Soft and nontender. No distention. No abdominal bruits. No CVA tenderness. Musculoskeletal: No lower extremity tenderness nor edema.  No joint effusions. Neurologic:  Normal  speech and language. No gross focal neurologic deficits are appreciated. No gait instability. Skin:  Skin is warm, dry and intact. No rash noted. Psychiatric: Mood and affect are normal. Speech and behavior are normal.  ____________________________________________   LABS (all labs ordered are listed, but only abnormal results are displayed)  Labs Reviewed - No data to display ____________________________________________  EKG   ____________________________________________  RADIOLOGY   ____________________________________________   PROCEDURES  Procedure(s) performed: None  Procedures  Critical Care performed: No  ____________________________________________   INITIAL IMPRESSION / ASSESSMENT AND PLAN / ED COURSE  Pertinent labs & imaging results that were available during my care of the patient were reviewed by me and considered in my medical decision making (see chart for details).  Cornea abrasion of the right eye. Patient given discharge care instructions. Patient advised follow-up with [facility] if no improvement within 3-5 days.      ____________________________________________   FINAL CLINICAL IMPRESSION(S) / ED DIAGNOSES  Final diagnoses:  Abrasion of right cornea, initial encounter      NEW MEDICATIONS STARTED DURING THIS VISIT:  Discharge Medication List as of 05/04/2017 11:33 AM    START taking these medications   Details  gentamicin (GARAMYCIN) 0.3 % ophthalmic ointment Place into the right eye 3 (three) times daily., Starting Mon 05/04/2017, Print         Note:  This document was prepared using Dragon voice recognition software and may include unintentional dictation errors.    Sable Feil, PA-C 05/04/17 1143    Earleen Newport, MD 05/04/17 [phone]

## 2017-07-06 DIAGNOSIS — L989 Disorder of the skin and subcutaneous tissue, unspecified: Secondary | ICD-10-CM | POA: Insufficient documentation

## 2017-07-06 DIAGNOSIS — Z118 Encounter for screening for other infectious and parasitic diseases: Secondary | ICD-10-CM | POA: Insufficient documentation

## 2018-05-20 ENCOUNTER — Encounter: Payer: Self-pay | Admitting: Emergency Medicine

## 2018-05-20 ENCOUNTER — Emergency Department
Admission: EM | Admit: 2018-05-20 | Discharge: 2018-05-20 | Disposition: A | Discharge status: Home / Self Care | Payer: Self-pay | Attending: Student in an Organized Health Care Education/Training Program | Admitting: Student in an Organized Health Care Education/Training Program

## 2018-05-20 ENCOUNTER — Emergency Department: Payer: Self-pay

## 2018-05-20 DIAGNOSIS — L03115 Cellulitis of right lower limb: Secondary | ICD-10-CM | POA: Insufficient documentation

## 2018-05-20 DIAGNOSIS — Z87891 Personal history of nicotine dependence: Secondary | ICD-10-CM | POA: Insufficient documentation

## 2018-05-20 DIAGNOSIS — E1165 Type 2 diabetes mellitus with hyperglycemia: Secondary | ICD-10-CM | POA: Insufficient documentation

## 2018-05-20 DIAGNOSIS — R739 Hyperglycemia, unspecified: Secondary | ICD-10-CM

## 2018-05-20 DIAGNOSIS — I252 Old myocardial infarction: Secondary | ICD-10-CM | POA: Insufficient documentation

## 2018-05-20 DIAGNOSIS — Z79899 Other long term (current) drug therapy: Secondary | ICD-10-CM | POA: Insufficient documentation

## 2018-05-20 LAB — CBC WITH DIFFERENTIAL/PLATELET
BASOS ABS: 0.1 10*3/uL (ref 0–0.1)
Basophils Relative: 1 %
EOS ABS: 0.2 10*3/uL (ref 0–0.7)
Eosinophils Relative: 4 %
HCT: 38.1 % — ABNORMAL LOW (ref 40.0–52.0)
Hemoglobin: 13.5 g/dL (ref 13.0–18.0)
LYMPHS ABS: 1.7 10*3/uL (ref 1.0–3.6)
LYMPHS PCT: 32 %
MCH: 31.3 pg (ref 26.0–34.0)
MCHC: 35.5 g/dL (ref 32.0–36.0)
MCV: 88.2 fL (ref 80.0–100.0)
Monocytes Absolute: 0.5 10*3/uL (ref 0.2–1.0)
Monocytes Relative: 10 %
NEUTROS PCT: 53 %
Neutro Abs: 2.8 10*3/uL (ref 1.4–6.5)
Platelets: 206 10*3/uL (ref 150–440)
RBC: 4.32 MIL/uL — AB (ref 4.40–5.90)
RDW: 13.1 % (ref 11.5–14.5)
WBC: 5.2 10*3/uL (ref 3.8–10.6)

## 2018-05-20 LAB — GLUCOSE, CAPILLARY
Glucose-Capillary: 241 mg/dL — ABNORMAL HIGH (ref 65–99)
Glucose-Capillary: 195 mg/dL — ABNORMAL HIGH (ref 65–99)

## 2018-05-20 LAB — COMPREHENSIVE METABOLIC PANEL
ALT: 24 U/L (ref 17–63)
AST: 20 U/L (ref 15–41)
Albumin: 3.9 g/dL (ref 3.5–5.0)
Alkaline Phosphatase: 68 U/L (ref 38–126)
Anion gap: 8 (ref 5–15)
BUN: 18 mg/dL (ref 6–20)
CALCIUM: 9.5 mg/dL (ref 8.9–10.3)
CHLORIDE: 98 mmol/L — AB (ref 101–111)
CO2: 29 mmol/L (ref 22–32)
CREATININE: 0.65 mg/dL (ref 0.61–1.24)
GFR calc non Af Amer: >60 mL/min (ref >60)
Glucose, Bld: 240 mg/dL — ABNORMAL HIGH (ref 65–99)
Potassium: 3.6 mmol/L (ref 3.5–5.1)
SODIUM: 135 mmol/L (ref 135–145)
Total Bilirubin: 0.6 mg/dL (ref 0.3–1.2)
Total Protein: 6.7 g/dL (ref 6.5–8.1)

## 2018-05-20 LAB — URINALYSIS, COMPLETE (UACMP) WITH MICROSCOPIC
Bacteria, UA: NONE SEEN
Bilirubin Urine: NEGATIVE
Glucose, UA: >=500 mg/dL — AB
Ketones, ur: 20 mg/dL — AB
Nitrite: NEGATIVE
Protein, ur: 30 mg/dL — AB
SPECIFIC GRAVITY, URINE: 1.022 (ref 1.005–1.030)
Squamous Epithelial / LPF: NONE SEEN (ref 0–5)
pH: 7 (ref 5.0–8.0)

## 2018-05-20 LAB — BETA-HYDROXYBUTYRIC ACID: BETA-HYDROXYBUTYRIC ACID: 0.87 mmol/L — AB (ref 0.05–0.27)

## 2018-05-20 MED ORDER — DOXYCYCLINE HYCLATE 100 MG PO TABS
100.0000 mg | ORAL_TABLET | Freq: Once | ORAL | Status: AC
  Administered 2018-05-20: 100 mg via ORAL
  Filled 2018-05-20: qty 1

## 2018-05-20 MED ORDER — INSULIN GLARGINE 100 UNIT/ML SOLOSTAR PEN: 30.0000 [IU] | PEN_INJECTOR | Freq: Two times a day (BID) | SUBCUTANEOUS | 0 refills | Status: DC

## 2018-05-20 MED ORDER — SODIUM CHLORIDE 0.9 % IV BOLUS
1000.0000 mL | Freq: Once | INTRAVENOUS | Status: AC
  Administered 2018-05-20: 1000 mL via INTRAVENOUS

## 2018-05-20 NOTE — ED Provider Notes (Signed)
[facility] Emergency Department Provider Note    First MD Initiated Contact with Patient 05/20/18 1840     (approximate)  I have reviewed the triage vital signs and the nursing notes.   HISTORY  Chief Complaint Hyperglycemia    HPI Mark [name] is a 37 y.o. male presents from community clinic with concern for DKA.  Patient has not been taking his insulin for the past several months.  Patient presented to clinic as he is noticed several worsening wounds to bilateral lower extremity but started having redness on the right.  He went for check and they found his blood sugar to be greater than 600.  He was given 10 units of patch that is related and sent to the ER.  Patient denies any nausea or vomiting.  States is been months since his been on his insulin and is not been checking his blood sugars.  Patient was given prescription for doxycycline for his cellulitis of the right lower extremity.   Past Medical History:  Diagnosis Date  . Diabetes mellitus without complication ([place])    Family History  Problem Relation Age of Onset  . Diabetes Other    Past Surgical History:  Procedure Laterality Date  . CARDIAC CATHETERIZATION N/A 07/10/2016   Procedure: Left Heart Cath and Coronary Angiography;  Surgeon: Dionisio David, MD;  Location: [facility];  Service: Cardiovascular;  Laterality: N/A;   Patient Active Problem List   Diagnosis Date Noted  . NSTEMI (non-ST elevated myocardial infarction) ([place]) 07/08/2016  . DKA (diabetic ketoacidoses) ([place]) 07/08/2016      Prior to Admission medications   Medication Sig Start Date End Date Taking? Authorizing Provider  ciprofloxacin (CIPRO) 500 MG tablet Take 1 tablet (500 mg total) by mouth every 12 (twelve) hours. 08/06/16   Paulina Fusi, MD  DULoxetine (CYMBALTA) 20 MG capsule Take 20 mg by mouth daily.    [provider]  gabapentin (NEURONTIN) 100 MG capsule Take 200 mg by mouth 3 (three)  times daily.    [provider]  gentamicin (GARAMYCIN) 0.3 % ophthalmic ointment Place into the right eye 3 (three) times daily. 05/04/17   Sable Feil, PA-C  Insulin Glargine (LANTUS) 100 UNIT/ML Solostar Pen Inject 30 Units into the skin 2 (two) times daily. 05/20/18   Merlyn Lot, MD    Allergies Penicillins    Social History Social History   Tobacco Use  . Smoking status: Former Research scientist (life sciences)  . Smokeless tobacco: Never Used  Substance Use Topics  . Alcohol use: No  . Drug use: Not on file    Review of Systems Patient denies headaches, rhinorrhea, blurry vision, numbness, shortness of breath, chest pain, edema, cough, abdominal pain, nausea, vomiting, diarrhea, dysuria, fevers, rashes or hallucinations unless otherwise stated above in HPI. ____________________________________________   PHYSICAL EXAM:  VITAL SIGNS: Vitals:   05/20/18 1900 05/20/18 1930  BP: (!) 134/92 135/89  Pulse: 80 83  Resp: 18 17  Temp:    SpO2: 99% 99%    Constitutional: Alert and oriented. Well appearing and in no acute distress. Eyes: Conjunctivae are normal.  Head: Atraumatic. Nose: No congestion/rhinnorhea. Mouth/Throat: Mucous membranes are moist.   Neck: No stridor. Painless ROM.  Cardiovascular: Normal rate, regular rhythm. Grossly normal heart sounds.  Good peripheral circulation. Respiratory: Normal respiratory effort.  No retractions. Lungs CTAB. Gastrointestinal: Soft and nontender. No distention. No abdominal bruits. No CVA tenderness. Genitourinary:  Musculoskeletal: No lower extremity tenderness 1+ bilateral  lower extremity edema with several evidence of chronic skin wound with small area surrounding cellulitis but nonpurulent drainage.  No crepitus.  No joint effusions. Neurologic:  Normal speech and language. No gross focal neurologic deficits are appreciated. No facial droop Skin:  Skin is warm, dry and intact. No rash noted. Psychiatric: Mood and affect are  normal. Speech and behavior are normal.  ____________________________________________   LABS (all labs ordered are listed, but only abnormal results are displayed)  Results for orders placed or performed during the hospital encounter of 05/20/18 (from the past 24 hour(s))  CBC with Differential/Platelet     Status: Abnormal   Collection Time: 05/20/18  6:52 PM  Result Value Ref Range   WBC 5.2 3.8 - 10.6 K/uL   RBC 4.32 (L) 4.40 - 5.90 MIL/uL   Hemoglobin 13.5 13.0 - 18.0 g/dL   HCT 38.1 (L) 40.0 - 52.0 %   MCV 88.2 80.0 - 100.0 fL   MCH 31.3 26.0 - 34.0 pg   MCHC 35.5 32.0 - 36.0 g/dL   RDW 13.1 11.5 - 14.5 %   Platelets 206 150 - 440 K/uL   Neutrophils Relative % 53 %   Neutro Abs 2.8 1.4 - 6.5 K/uL   Lymphocytes Relative 32 %   Lymphs Abs 1.7 1.0 - 3.6 K/uL   Monocytes Relative 10 %   Monocytes Absolute 0.5 0.2 - 1.0 K/uL   Eosinophils Relative 4 %   Eosinophils Absolute 0.2 0 - 0.7 K/uL   Basophils Relative 1 %   Basophils Absolute 0.1 0 - 0.1 K/uL  Comprehensive metabolic panel     Status: Abnormal   Collection Time: 05/20/18  6:52 PM  Result Value Ref Range   Sodium 135 135 - 145 mmol/L   Potassium 3.6 3.5 - 5.1 mmol/L   Chloride 98 (L) 101 - 111 mmol/L   CO2 29 22 - 32 mmol/L   Glucose, Bld 240 (H) 65 - 99 mg/dL   BUN 18 6 - 20 mg/dL   Creatinine, Ser 0.65 0.61 - 1.24 mg/dL   Calcium 9.5 8.9 - 10.3 mg/dL   Total Protein 6.7 6.5 - 8.1 g/dL   Albumin 3.9 3.5 - 5.0 g/dL   AST 20 15 - 41 U/L   ALT 24 17 - 63 U/L   Alkaline Phosphatase 68 38 - 126 U/L   Total Bilirubin 0.6 0.3 - 1.2 mg/dL   GFR calc non Af Amer >60 >60 mL/min   GFR calc Af Amer >60 >60 mL/min   Anion gap 8 5 - 15  Beta-hydroxybutyric acid     Status: Abnormal   Collection Time: 05/20/18  6:52 PM  Result Value Ref Range   Beta-Hydroxybutyric Acid 0.87 (H) 0.05 - 0.27 mmol/L  Glucose, capillary     Status: Abnormal   Collection Time: 05/20/18  6:54 PM  Result Value Ref Range    Glucose-Capillary 241 (H) 65 - 99 mg/dL  Glucose, capillary     Status: Abnormal   Collection Time: 05/20/18  8:25 PM  Result Value Ref Range   Glucose-Capillary 195 (H) 65 - 99 mg/dL  Urinalysis, Complete w Microscopic     Status: Abnormal   Collection Time: 05/20/18  8:26 PM  Result Value Ref Range   Color, Urine STRAW (A) YELLOW   APPearance CLEAR (A) CLEAR   Specific Gravity, Urine 1.022 1.005 - 1.030   pH 7.0 5.0 - 8.0   Glucose, UA >=500 (A) NEGATIVE mg/dL   Hgb urine  dipstick SMALL (A) NEGATIVE   Bilirubin Urine NEGATIVE NEGATIVE   Ketones, ur 20 (A) NEGATIVE mg/dL   Protein, ur 30 (A) NEGATIVE mg/dL   Nitrite NEGATIVE NEGATIVE   Leukocytes, UA TRACE (A) NEGATIVE   RBC / HPF 0-5 0 - 5 RBC/hpf   WBC, UA 21-50 0 - 5 WBC/hpf   Bacteria, UA NONE SEEN NONE SEEN   Squamous Epithelial / LPF NONE SEEN 0 - 5   ____________________________________________ ____________________________________________  RADIOLOGY  I personally reviewed all radiographic images ordered to evaluate for the above acute complaints and reviewed radiology reports and findings.  These findings were personally discussed with the patient.  Please see medical record for radiology report.  ____________________________________________   PROCEDURES  Procedure(s) performed:  Procedures    Critical Care performed: no ____________________________________________   INITIAL IMPRESSION / ASSESSMENT AND PLAN / ED COURSE  Pertinent labs & imaging results that were available during my care of the patient were reviewed by me and considered in my medical decision making (see chart for details).  DDX: dka, hhs, celulitis, dehydration, noncompliance  SUMMIT ARROYAVE is a 37 y.o. who presents to the ED with symptoms as described above.  Will provide IV fluids.  Patient looks clinically well and timeframe does not seem clinically consistent with DKA but will evaluate for metabolic derangement.  X-ray ordered to  evaluate for soft tissue or foreign body shows soft tissue abnormality consistent with the chronic wounds and cellulitis but no evidence of lytic lesions or foreign body.  Clinical Course as of May 20 2099  Thu May 20, 2018  1956 No evidence of DKA.  Mildly elevated ketones but no anion gap acidosis.  Blood sugar is improving.  X-ray shows no evidence of lytic changes.   [PR]  2100 Blood sugar has significantly improved with simply IV hydration.  This point patient is stable and appropriate for discharge home.  Have discussed restarting his previous insulin regimen and follow-up with PCP.  Have discussed with the patient and available family all diagnostics and treatments performed thus far and all questions were answered to the best of my ability. The patient demonstrates understanding and agreement with plan.    [PR]    Clinical Course User Index [PR] Merlyn Lot, MD     As part of my medical decision making, I reviewed the following data within the [place] notes reviewed and incorporated, Labs reviewed, notes from prior ED visits.  ____________________________________________   FINAL CLINICAL IMPRESSION(S) / ED DIAGNOSES  Final diagnoses:  Hyperglycemia  Cellulitis of right lower extremity      NEW MEDICATIONS STARTED DURING THIS VISIT:  Current Discharge Medication List       Note:  This document was prepared using Dragon voice recognition software and may include unintentional dictation errors.    Merlyn Lot, MD 05/20/18 2100

## 2018-05-20 NOTE — ED Notes (Signed)
Pt given urinal, instructed to call out when he has a sample.

## 2018-05-20 NOTE — ED Triage Notes (Signed)
PT arrived from [facility] with concerns of DKA. Pt had blood sugar reading of HI and was given 10 units of insulin and was sent to ED for further evaluation.

## 2018-07-29 DIAGNOSIS — E11319 Type 2 diabetes mellitus with unspecified diabetic retinopathy without macular edema: Secondary | ICD-10-CM | POA: Insufficient documentation

## 2018-10-07 DIAGNOSIS — E11311 Type 2 diabetes mellitus with unspecified diabetic retinopathy with macular edema: Secondary | ICD-10-CM | POA: Insufficient documentation

## 2019-01-19 IMAGING — DX DG TIBIA/FIBULA 2V*R*
2 series · 2 of 2 positions shown · non-contrast
Comparison: None.

CLINICAL DATA: 37-year-old male with an open wound of the right
anterior shin.

EXAM:
RIGHT TIBIA AND FIBULA - 2 VIEW

[tibia ap]
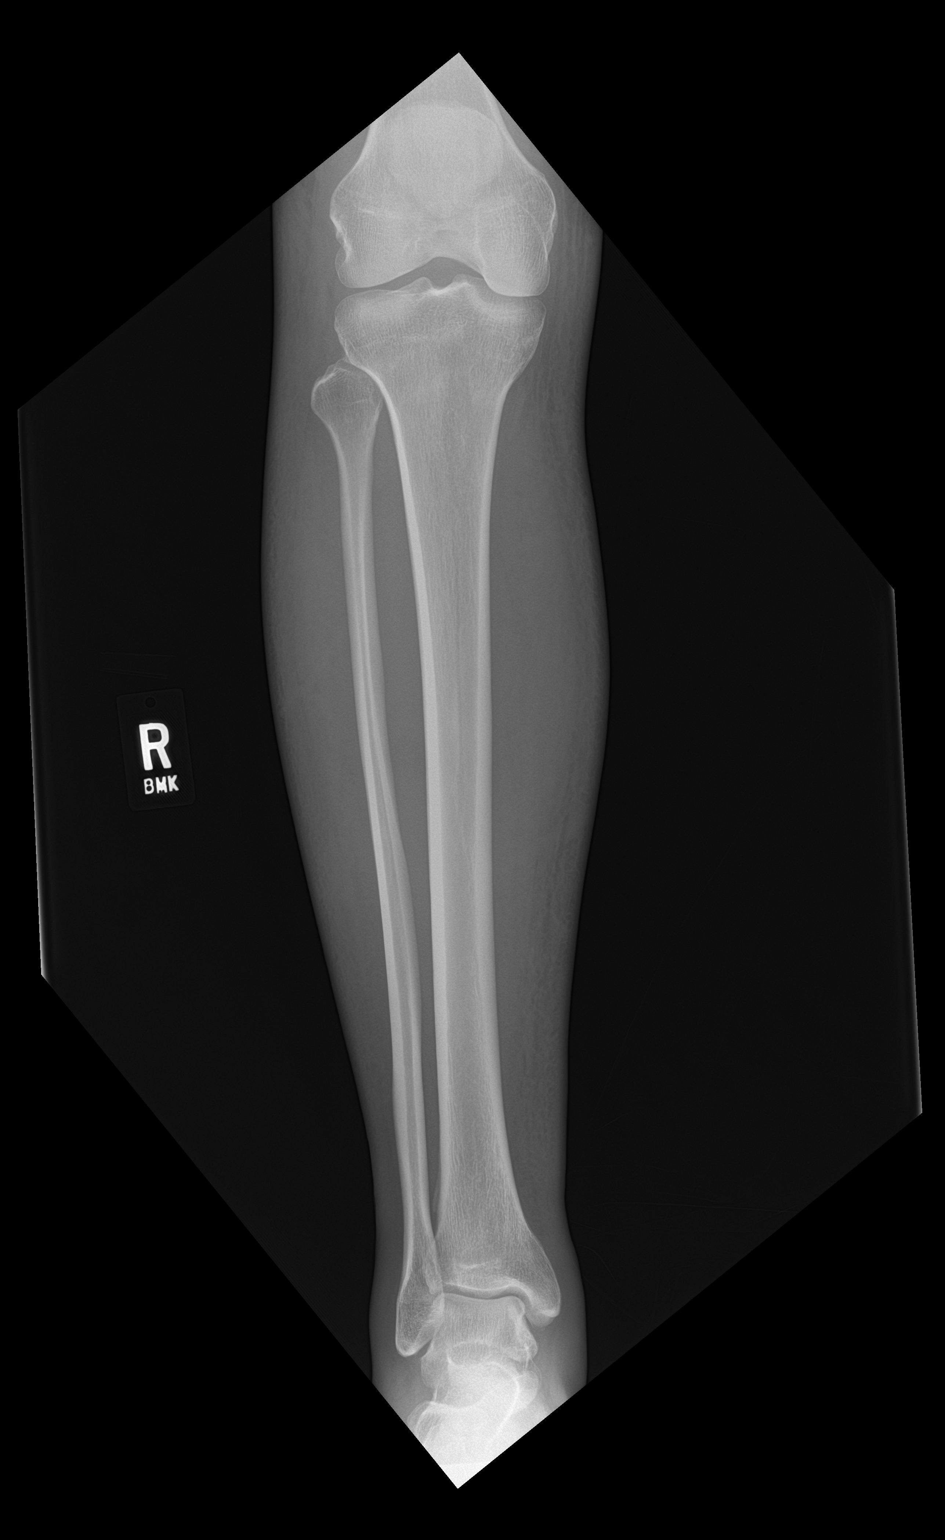

[tibia lat]
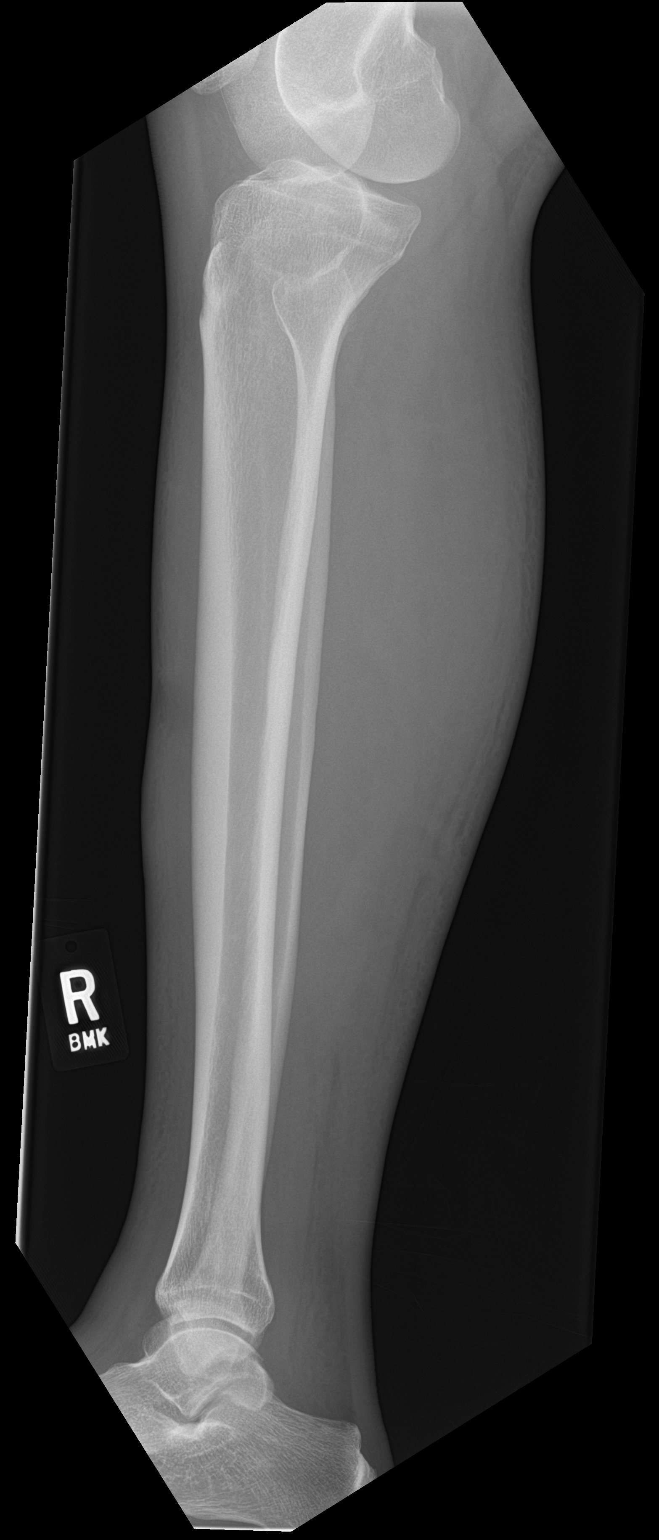

[2 of 2 positions shown; findings below may reference images not displayed]

FINDINGS: No evidence of fracture, malalignment or osseous abnormality. No
retained radiopaque foreign body. Diffuse soft tissue edema and
irregularity along the anterior mid shin.
IMPRESSION: Soft tissue edema and irregularity along the anterior mid shin
without evidence of underlying osseous involvement.

## 2019-04-08 DIAGNOSIS — T310 Burns involving less than 10% of body surface: Secondary | ICD-10-CM

## 2019-04-08 HISTORY — DX: Burns involving less than 10% of body surface: T31.0

## 2019-04-29 HISTORY — PX: SKIN GRAFT: SHX250

## 2019-05-30 DIAGNOSIS — H3341 Traction detachment of retina, right eye: Secondary | ICD-10-CM

## 2019-05-30 DIAGNOSIS — H3320 Serous retinal detachment, unspecified eye: Secondary | ICD-10-CM

## 2019-05-30 HISTORY — DX: Serous retinal detachment, unspecified eye: H33.20

## 2019-05-30 HISTORY — DX: Traction detachment of retina, right eye: H33.41

## 2019-06-15 HISTORY — PX: PARS PLANA VITRECTOMY: SHX2166

## 2019-06-16 DIAGNOSIS — H4312 Vitreous hemorrhage, left eye: Secondary | ICD-10-CM | POA: Insufficient documentation

## 2019-08-10 HISTORY — PX: VITRECTOMY: SHX106

## 2019-09-21 HISTORY — PX: VITRECTOMY: SHX106

## 2019-12-21 HISTORY — PX: VITRECTOMY: SHX106

## 2020-12-05 HISTORY — PX: VITRECTOMY AND CATARACT: SHX6184

## 2021-05-19 DIAGNOSIS — E8809 Other disorders of plasma-protein metabolism, not elsewhere classified: Secondary | ICD-10-CM | POA: Insufficient documentation

## 2021-05-19 DIAGNOSIS — N049 Nephrotic syndrome with unspecified morphologic changes: Secondary | ICD-10-CM | POA: Insufficient documentation

## 2021-05-19 DIAGNOSIS — R1110 Vomiting, unspecified: Secondary | ICD-10-CM | POA: Insufficient documentation

## 2021-05-19 DIAGNOSIS — D649 Anemia, unspecified: Secondary | ICD-10-CM | POA: Insufficient documentation

## 2021-05-19 DIAGNOSIS — R339 Retention of urine, unspecified: Secondary | ICD-10-CM | POA: Insufficient documentation

## 2021-05-19 DIAGNOSIS — N5089 Other specified disorders of the male genital organs: Secondary | ICD-10-CM | POA: Insufficient documentation

## 2021-05-21 DIAGNOSIS — I5032 Chronic diastolic (congestive) heart failure: Secondary | ICD-10-CM | POA: Insufficient documentation

## 2021-05-21 DIAGNOSIS — I5030 Unspecified diastolic (congestive) heart failure: Secondary | ICD-10-CM | POA: Insufficient documentation

## 2021-05-24 DIAGNOSIS — I429 Cardiomyopathy, unspecified: Secondary | ICD-10-CM | POA: Insufficient documentation

## 2021-07-05 DIAGNOSIS — F17200 Nicotine dependence, unspecified, uncomplicated: Secondary | ICD-10-CM | POA: Insufficient documentation

## 2021-07-07 DIAGNOSIS — E1169 Type 2 diabetes mellitus with other specified complication: Secondary | ICD-10-CM | POA: Insufficient documentation

## 2021-07-23 DIAGNOSIS — I10 Essential (primary) hypertension: Secondary | ICD-10-CM | POA: Insufficient documentation

## 2021-07-23 DIAGNOSIS — R630 Anorexia: Secondary | ICD-10-CM | POA: Insufficient documentation

## 2021-07-23 DIAGNOSIS — R5383 Other fatigue: Secondary | ICD-10-CM | POA: Insufficient documentation

## 2021-08-08 DIAGNOSIS — F419 Anxiety disorder, unspecified: Secondary | ICD-10-CM | POA: Insufficient documentation

## 2021-09-18 DIAGNOSIS — E1143 Type 2 diabetes mellitus with diabetic autonomic (poly)neuropathy: Secondary | ICD-10-CM | POA: Insufficient documentation

## 2021-10-10 HISTORY — PX: UPPER GASTROINTESTINAL ENDOSCOPY: SHX188

## 2021-10-29 DIAGNOSIS — N189 Chronic kidney disease, unspecified: Secondary | ICD-10-CM | POA: Insufficient documentation

## 2022-02-21 DIAGNOSIS — N189 Chronic kidney disease, unspecified: Secondary | ICD-10-CM | POA: Insufficient documentation

## 2022-02-21 DIAGNOSIS — R112 Nausea with vomiting, unspecified: Secondary | ICD-10-CM | POA: Insufficient documentation

## 2022-02-21 DIAGNOSIS — E1122 Type 2 diabetes mellitus with diabetic chronic kidney disease: Secondary | ICD-10-CM | POA: Insufficient documentation

## 2022-02-21 DIAGNOSIS — Z91148 Patient's other noncompliance with medication regimen for other reason: Secondary | ICD-10-CM

## 2022-02-21 DIAGNOSIS — N179 Acute kidney failure, unspecified: Secondary | ICD-10-CM | POA: Insufficient documentation

## 2022-02-21 DIAGNOSIS — K920 Hematemesis: Secondary | ICD-10-CM | POA: Insufficient documentation

## 2022-02-21 HISTORY — DX: Patient's other noncompliance with medication regimen for other reason: Z91.148

## 2022-05-15 ENCOUNTER — Ambulatory Visit: Payer: Self-pay | Admitting: Surgery

## 2022-05-15 ENCOUNTER — Encounter: Payer: Self-pay | Admitting: Surgery

## 2022-05-15 ENCOUNTER — Ambulatory Visit (INDEPENDENT_AMBULATORY_CARE_PROVIDER_SITE_OTHER): Payer: Medicaid Other | Admitting: Surgery

## 2022-05-15 ENCOUNTER — Other Ambulatory Visit: Payer: Self-pay

## 2022-05-15 VITALS — BP 171/96 | HR 60 | Temp 98.0°F | Ht 70.0 in | Wt 141.6 lb

## 2022-05-15 DIAGNOSIS — E1122 Type 2 diabetes mellitus with diabetic chronic kidney disease: Secondary | ICD-10-CM

## 2022-05-15 DIAGNOSIS — Z992 Dependence on renal dialysis: Secondary | ICD-10-CM

## 2022-05-15 DIAGNOSIS — Z794 Long term (current) use of insulin: Secondary | ICD-10-CM

## 2022-05-15 DIAGNOSIS — N185 Chronic kidney disease, stage 5: Secondary | ICD-10-CM | POA: Diagnosis not present
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
## 2022-05-15 NOTE — Patient Instructions (Signed)
Our surgery scheduler will call you within 24-48 hours to schedule your surgery. Please have the Blue surgery sheet available when speaking with her.  
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
## 2022-05-16 ENCOUNTER — Telehealth: Payer: Self-pay | Admitting: Surgery

## 2022-05-16 NOTE — Telephone Encounter (Signed)
Patient has been advised of Pre-Admission date/time, COVID Testing date and Surgery date.  Surgery Date: 05/23/22 Preadmission Testing Date: 05/19/22 (phone 8a-1p) Covid Testing Date: Not needed.     Patient has been made aware to call [phone], between 1-3:00pm the day before surgery, to find out what time to arrive for surgery.

## 2022-05-19 ENCOUNTER — Encounter
Admission: RE | Admit: 2022-05-19 | Discharge: 2022-05-19 | Disposition: A | Discharge status: Home / Self Care | Payer: Medicaid Other | Source: Ambulatory Visit | Attending: Surgery | Admitting: Surgery

## 2022-05-19 VITALS — Ht 70.0 in | Wt 143.0 lb

## 2022-05-19 DIAGNOSIS — Z01812 Encounter for preprocedural laboratory examination: Secondary | ICD-10-CM

## 2022-05-19 DIAGNOSIS — E1122 Type 2 diabetes mellitus with diabetic chronic kidney disease: Secondary | ICD-10-CM

## 2022-05-19 DIAGNOSIS — I10 Essential (primary) hypertension: Secondary | ICD-10-CM

## 2022-05-19 HISTORY — DX: Personal history of urinary calculi: Z87.442

## 2022-05-19 HISTORY — DX: Chronic kidney disease, stage 5: N18.5

## 2022-05-19 HISTORY — DX: Blindness, one eye, unspecified eye: H54.40

## 2022-05-19 HISTORY — DX: Type 2 diabetes mellitus with diabetic nephropathy: E11.21

## 2022-05-19 HISTORY — DX: Type 2 diabetes mellitus with diabetic neuropathy, unspecified: E11.40

## 2022-05-19 HISTORY — DX: Gastro-esophageal reflux disease without esophagitis: K21.9

## 2022-05-19 HISTORY — DX: Anemia, unspecified: D64.9

## 2022-05-19 HISTORY — DX: Type 2 diabetes mellitus with unspecified diabetic retinopathy without macular edema: E11.319

## 2022-05-19 HISTORY — DX: Gastroparesis: K31.84

## 2022-05-19 HISTORY — DX: Type 2 diabetes mellitus with diabetic chronic kidney disease: E11.22

## 2022-05-19 HISTORY — DX: Type 2 diabetes mellitus with unspecified diabetic retinopathy with macular edema: E11.311

## 2022-05-19 HISTORY — DX: Essential (primary) hypertension: I10

## 2022-05-19 NOTE — Patient Instructions (Addendum)
Your procedure is scheduled on: Friday May 26 Report to the Registration Desk on the 1st floor of the [facility]. To find out your arrival time, please call [phone] between 1PM - 3PM on: Thursday, May 25 If your arrival time is 6:00 am, do not arrive prior to that time as the [facility] entrance doors do not open until 6:00 am.  REMEMBER: Instructions that are not followed completely may result in serious medical risk, up to and including death; or upon the discretion of your surgeon and anesthesiologist your surgery may need to be rescheduled.  Do not eat or drink after midnight the night before surgery.  No gum chewing, lozengers or hard candies.  DO NOT TAKE ANY MEDICATIONS THE MORNING OF SURGERY   One week prior to surgery: Starting today, May 22 Stop Anti-inflammatories (NSAIDS) such as Advil, Aleve, Ibuprofen, Motrin, Naproxen, Naprosyn and Aspirin based products such as Excedrin, Goodys Powder, BC Powder. Stop ANY OVER THE COUNTER supplements until after surgery. You may however, continue to take Tylenol if needed for pain up until the day of surgery.  No Alcohol for 24 hours before or after surgery.  No Smoking including e-cigarettes for 24 hours prior to surgery.  No chewable tobacco products for at least 6 hours prior to surgery.  No nicotine patches on the day of surgery.  Do not use any "recreational" drugs for at least a week prior to your surgery.  Please be advised that the combination of cocaine and anesthesia may have negative outcomes, up to and including death. If you test positive for cocaine, your surgery will be cancelled.  On the morning of surgery brush your teeth with toothpaste and water, you may rinse your mouth with mouthwash if you wish. Do not swallow any toothpaste or mouthwash.  Use CHG wipes as directed on instruction sheet.  Do not wear jewelry, make-up, hairpins, clips or nail polish.  Do not wear lotions, powders, or perfumes.   Do  not shave body from the neck down 48 hours prior to surgery just in case you cut yourself which could leave a site for infection.  Also, freshly shaved skin may become irritated if using the CHG soap.  Contact lenses, hearing aids and dentures may not be worn into surgery.  Do not bring valuables to the hospital. [facility] is not responsible for any missing/lost belongings or valuables.   Notify your doctor if there is any change in your medical condition (cold, fever, infection).  Wear comfortable clothing (specific to your surgery type) to the hospital.  After surgery, you can help prevent lung complications by doing breathing exercises.  Take deep breaths and cough every 1-2 hours. Your doctor may order a device called an Incentive Spirometer to help you take deep breaths.  If you are being discharged the day of surgery, you will not be allowed to drive home. You will need a responsible adult (18 years or older) to drive you home and stay with you that night.   If you are taking public transportation, you will need to have a responsible adult (18 years or older) with you. Please confirm with your physician that it is acceptable to use public transportation.   Please call the [facility] Dept. at [phone] if you have any questions about these instructions.  Surgery Visitation Policy:  Patients undergoing a surgery or procedure may have two family members or support persons with them as long as the person is not COVID-19 positive  or experiencing its symptoms.     Preparing the Skin Before Surgery     To help prevent the risk of infection at your surgical site, we are now providing you with rinse-free Sage 2% Chlorhexidine Gluconate (CHG) disposable wipes.  The night before surgery: Shower or bathe with warm water. Do not apply perfume, lotions, powders. Wait one hour after shower. Skin should be dry and cool. Open Sage wipe package - 6 disposable cloths are  inside. Wipe body using one cloth for the right arm, one cloth for the left arm, one cloth for the right leg, one cloth for the left leg, one cloth for the chest/abdomen area (do not use on breasts if breast feeding), and one cloth for the back. 5. Do not rinse, allow to dry. 6. Skin may fee "tacky" for several minutes. 7. Dress in clean clothes. 8. Place clean sheets on your bed and do not sleep with pets.  REPEAT ABOVE ON THE MORNING OF SURGERY PRIOR TO ARRIVING TO [place].

## 2022-05-20 ENCOUNTER — Encounter: Payer: Self-pay | Admitting: Urgent Care

## 2022-05-20 ENCOUNTER — Encounter
Admission: RE | Admit: 2022-05-20 | Discharge: 2022-05-20 | Disposition: A | Discharge status: Home / Self Care | Payer: Medicaid Other | Source: Ambulatory Visit | Attending: Surgery | Admitting: Surgery

## 2022-05-20 DIAGNOSIS — E1122 Type 2 diabetes mellitus with diabetic chronic kidney disease: Secondary | ICD-10-CM | POA: Diagnosis not present

## 2022-05-20 DIAGNOSIS — Z01812 Encounter for preprocedural laboratory examination: Secondary | ICD-10-CM | POA: Diagnosis present

## 2022-05-20 DIAGNOSIS — I10 Essential (primary) hypertension: Secondary | ICD-10-CM | POA: Insufficient documentation

## 2022-05-20 DIAGNOSIS — N185 Chronic kidney disease, stage 5: Secondary | ICD-10-CM | POA: Diagnosis not present

## 2022-05-20 LAB — BASIC METABOLIC PANEL
Anion gap: 11 (ref 5–15)
BUN: 52 mg/dL — ABNORMAL HIGH (ref 6–20)
CO2: 27 mmol/L (ref 22–32)
Calcium: 8.5 mg/dL — ABNORMAL LOW (ref 8.9–10.3)
Chloride: 102 mmol/L (ref 98–111)
Creatinine, Ser: 5.32 mg/dL — ABNORMAL HIGH (ref 0.61–1.24)
GFR, Estimated: 13 mL/min — ABNORMAL LOW (ref >60)
Glucose, Bld: 174 mg/dL — ABNORMAL HIGH (ref 70–99)
Potassium: 4.4 mmol/L (ref 3.5–5.1)
Sodium: 140 mmol/L (ref 135–145)

## 2022-05-20 LAB — CBC
HCT: 36.1 % — ABNORMAL LOW (ref 39.0–52.0)
Hemoglobin: 11.8 g/dL — ABNORMAL LOW (ref 13.0–17.0)
MCH: 30.3 pg (ref 26.0–34.0)
MCHC: 32.7 g/dL (ref 30.0–36.0)
MCV: 92.6 fL (ref 80.0–100.0)
Platelets: 178 10*3/uL (ref 150–400)
RBC: 3.9 MIL/uL — ABNORMAL LOW (ref 4.22–5.81)
RDW: 14.2 % (ref 11.5–15.5)
WBC: 4.8 10*3/uL (ref 4.0–10.5)
nRBC: 0 % (ref 0.0–0.2)

## 2022-05-22 MED ORDER — ORAL CARE MOUTH RINSE: 15.0000 mL | Freq: Once | OROMUCOSAL | Status: AC

## 2022-05-22 MED ORDER — CHLORHEXIDINE GLUCONATE CLOTH 2 % EX PADS: 6.0000 | MEDICATED_PAD | Freq: Once | CUTANEOUS | Status: DC

## 2022-05-22 MED ORDER — CEFAZOLIN SODIUM-DEXTROSE 2-4 GM/100ML-% IV SOLN
2.0000 g | INTRAVENOUS | Status: AC
  Administered 2022-05-23: 2 g via INTRAVENOUS

## 2022-05-22 MED ORDER — METOPROLOL TARTRATE 50 MG PO TABS: 50.0000 mg | ORAL_TABLET | Freq: Once | ORAL | Status: DC

## 2022-05-22 MED ORDER — BUPIVACAINE LIPOSOME 1.3 % IJ SUSP: 20.0000 mL | Freq: Once | INTRAMUSCULAR | Status: DC

## 2022-05-22 MED ORDER — SODIUM CHLORIDE 0.9 % IV SOLN: INTRAVENOUS | Status: DC

## 2022-05-22 MED ORDER — CELECOXIB 200 MG PO CAPS: 200.0000 mg | ORAL_CAPSULE | ORAL | Status: AC

## 2022-05-22 MED ORDER — FAMOTIDINE 20 MG PO TABS: 20.0000 mg | ORAL_TABLET | Freq: Once | ORAL | Status: AC

## 2022-05-22 MED ORDER — GABAPENTIN 300 MG PO CAPS: 300.0000 mg | ORAL_CAPSULE | ORAL | Status: AC

## 2022-05-22 MED ORDER — CHLORHEXIDINE GLUCONATE 0.12 % MT SOLN: 15.0000 mL | Freq: Once | OROMUCOSAL | Status: AC

## 2022-05-22 MED ORDER — ACETAMINOPHEN 500 MG PO TABS: 1000.0000 mg | ORAL_TABLET | ORAL | Status: AC

## 2022-05-23 ENCOUNTER — Encounter
Admission: RE | Disposition: A | Discharge status: Home / Self Care | Payer: Self-pay | Source: Home / Self Care | Attending: Surgery

## 2022-05-23 ENCOUNTER — Ambulatory Visit
Admission: RE | Admit: 2022-05-23 | Discharge: 2022-05-23 | Disposition: A | Discharge status: Home / Self Care | Payer: Medicaid Other | Attending: Surgery | Admitting: Surgery

## 2022-05-23 ENCOUNTER — Ambulatory Visit: Payer: Medicaid Other | Admitting: Certified Registered"

## 2022-05-23 ENCOUNTER — Encounter: Payer: Self-pay | Admitting: Surgery

## 2022-05-23 ENCOUNTER — Other Ambulatory Visit: Payer: Self-pay

## 2022-05-23 DIAGNOSIS — Z794 Long term (current) use of insulin: Secondary | ICD-10-CM | POA: Diagnosis not present

## 2022-05-23 DIAGNOSIS — Z992 Dependence on renal dialysis: Secondary | ICD-10-CM | POA: Diagnosis not present

## 2022-05-23 DIAGNOSIS — I132 Hypertensive heart and chronic kidney disease with heart failure and with stage 5 chronic kidney disease, or end stage renal disease: Secondary | ICD-10-CM | POA: Insufficient documentation

## 2022-05-23 DIAGNOSIS — I5030 Unspecified diastolic (congestive) heart failure: Secondary | ICD-10-CM | POA: Insufficient documentation

## 2022-05-23 DIAGNOSIS — N186 End stage renal disease: Secondary | ICD-10-CM | POA: Diagnosis present

## 2022-05-23 DIAGNOSIS — F17210 Nicotine dependence, cigarettes, uncomplicated: Secondary | ICD-10-CM | POA: Insufficient documentation

## 2022-05-23 DIAGNOSIS — N185 Chronic kidney disease, stage 5: Secondary | ICD-10-CM | POA: Diagnosis not present

## 2022-05-23 DIAGNOSIS — E1122 Type 2 diabetes mellitus with diabetic chronic kidney disease: Secondary | ICD-10-CM | POA: Insufficient documentation

## 2022-05-23 DIAGNOSIS — Z01812 Encounter for preprocedural laboratory examination: Secondary | ICD-10-CM

## 2022-05-23 HISTORY — PX: CAPD INSERTION: SHX5233

## 2022-05-23 LAB — POCT I-STAT, CHEM 8
BUN: 46 mg/dL — ABNORMAL HIGH (ref 6–20)
Calcium, Ion: 1.07 mmol/L — ABNORMAL LOW (ref 1.15–1.40)
Chloride: 102 mmol/L (ref 98–111)
Creatinine, Ser: 6.4 mg/dL — ABNORMAL HIGH (ref 0.61–1.24)
Glucose, Bld: 146 mg/dL — ABNORMAL HIGH (ref 70–99)
HCT: 35 % — ABNORMAL LOW (ref 39.0–52.0)
Hemoglobin: 11.9 g/dL — ABNORMAL LOW (ref 13.0–17.0)
Potassium: 4.1 mmol/L (ref 3.5–5.1)
Sodium: 139 mmol/L (ref 135–145)
TCO2: 26 mmol/L (ref 22–32)

## 2022-05-23 LAB — GLUCOSE, CAPILLARY: Glucose-Capillary: 187 mg/dL — ABNORMAL HIGH (ref 70–99)

## 2022-05-23 SURGERY — LAPAROSCOPIC INSERTION CONTINUOUS AMBULATORY PERITONEAL DIALYSIS  (CAPD) CATHETER
Anesthesia: General | Site: Abdomen

## 2022-05-23 MED ORDER — CELECOXIB 200 MG PO CAPS
ORAL_CAPSULE | ORAL | Status: AC
  Administered 2022-05-23: 200 mg via ORAL
  Filled 2022-05-23: qty 1

## 2022-05-23 MED ORDER — EPHEDRINE SULFATE (PRESSORS) 50 MG/ML IJ SOLN
INTRAMUSCULAR | Status: DC | PRN
  Administered 2022-05-23: 5 mg via INTRAVENOUS
  Administered 2022-05-23: 10 mg via INTRAVENOUS
  Administered 2022-05-23: 5 mg via INTRAVENOUS

## 2022-05-23 MED ORDER — BUPIVACAINE-EPINEPHRINE (PF) 0.25% -1:200000 IJ SOLN
INTRAMUSCULAR | Status: AC
  Filled 2022-05-23: qty 30

## 2022-05-23 MED ORDER — MIDAZOLAM HCL 2 MG/2ML IJ SOLN
INTRAMUSCULAR | Status: DC | PRN
  Administered 2022-05-23 (×2): 1 mg via INTRAVENOUS

## 2022-05-23 MED ORDER — ROCURONIUM BROMIDE 100 MG/10ML IV SOLN
INTRAVENOUS | Status: DC | PRN
  Administered 2022-05-23: 20 mg via INTRAVENOUS
  Administered 2022-05-23: 40 mg via INTRAVENOUS

## 2022-05-23 MED ORDER — ACETAMINOPHEN 500 MG PO TABS
ORAL_TABLET | ORAL | Status: AC
Start: 2022-05-23 — End: 2022-05-23
  Administered 2022-05-23: 1000 mg via ORAL
  Filled 2022-05-23: qty 2

## 2022-05-23 MED ORDER — PROPOFOL 10 MG/ML IV BOLUS
INTRAVENOUS | Status: DC | PRN
Start: 2022-05-23 — End: 2022-05-23
  Administered 2022-05-23: 120 mg via INTRAVENOUS

## 2022-05-23 MED ORDER — FAMOTIDINE 20 MG PO TABS
ORAL_TABLET | ORAL | Status: AC
  Administered 2022-05-23: 20 mg via ORAL
  Filled 2022-05-23: qty 1

## 2022-05-23 MED ORDER — HYDROCODONE-ACETAMINOPHEN 5-325 MG PO TABS: 1.0000 | ORAL_TABLET | Freq: Four times a day (QID) | ORAL | 0 refills | Status: DC | PRN

## 2022-05-23 MED ORDER — DEXAMETHASONE SODIUM PHOSPHATE 10 MG/ML IJ SOLN
INTRAMUSCULAR | Status: AC
  Filled 2022-05-23: qty 1

## 2022-05-23 MED ORDER — ONDANSETRON HCL 4 MG/2ML IJ SOLN
INTRAMUSCULAR | Status: DC | PRN
Start: 2022-05-23 — End: 2022-05-23
  Administered 2022-05-23: 4 mg via INTRAVENOUS

## 2022-05-23 MED ORDER — FENTANYL CITRATE (PF) 100 MCG/2ML IJ SOLN
INTRAMUSCULAR | Status: DC | PRN
  Administered 2022-05-23 (×2): 50 ug via INTRAVENOUS

## 2022-05-23 MED ORDER — FENTANYL CITRATE (PF) 100 MCG/2ML IJ SOLN: 25.0000 ug | INTRAMUSCULAR | Status: DC | PRN

## 2022-05-23 MED ORDER — GLYCOPYRROLATE 0.2 MG/ML IJ SOLN
INTRAMUSCULAR | Status: DC | PRN
  Administered 2022-05-23: .8 mg via INTRAVENOUS

## 2022-05-23 MED ORDER — NEOSTIGMINE METHYLSULFATE 10 MG/10ML IV SOLN
INTRAVENOUS | Status: DC | PRN
  Administered 2022-05-23: 5 mg via INTRAVENOUS

## 2022-05-23 MED ORDER — FENTANYL CITRATE (PF) 100 MCG/2ML IJ SOLN
INTRAMUSCULAR | Status: AC
  Filled 2022-05-23: qty 2

## 2022-05-23 MED ORDER — LIDOCAINE HCL (PF) 2 % IJ SOLN
INTRAMUSCULAR | Status: AC
  Filled 2022-05-23: qty 5

## 2022-05-23 MED ORDER — NEOSTIGMINE METHYLSULFATE 10 MG/10ML IV SOLN
INTRAVENOUS | Status: AC
  Filled 2022-05-23: qty 1

## 2022-05-23 MED ORDER — 0.9 % SODIUM CHLORIDE (POUR BTL) OPTIME
TOPICAL | Status: DC | PRN
  Administered 2022-05-23: 1000 mL

## 2022-05-23 MED ORDER — GABAPENTIN 300 MG PO CAPS
ORAL_CAPSULE | ORAL | Status: AC
  Administered 2022-05-23: 300 mg via ORAL
  Filled 2022-05-23: qty 1

## 2022-05-23 MED ORDER — ROCURONIUM BROMIDE 10 MG/ML (PF) SYRINGE
PREFILLED_SYRINGE | INTRAVENOUS | Status: AC
  Filled 2022-05-23: qty 10

## 2022-05-23 MED ORDER — SUCCINYLCHOLINE CHLORIDE 200 MG/10ML IV SOSY
PREFILLED_SYRINGE | INTRAVENOUS | Status: AC
  Filled 2022-05-23: qty 10

## 2022-05-23 MED ORDER — ONDANSETRON HCL 4 MG/2ML IJ SOLN
INTRAMUSCULAR | Status: AC
  Filled 2022-05-23: qty 2

## 2022-05-23 MED ORDER — CEFAZOLIN SODIUM-DEXTROSE 2-4 GM/100ML-% IV SOLN
INTRAVENOUS | Status: AC
  Filled 2022-05-23: qty 100

## 2022-05-23 MED ORDER — GLYCOPYRROLATE 0.2 MG/ML IJ SOLN
INTRAMUSCULAR | Status: AC
  Filled 2022-05-23: qty 3

## 2022-05-23 MED ORDER — LIDOCAINE HCL (CARDIAC) PF 100 MG/5ML IV SOSY
PREFILLED_SYRINGE | INTRAVENOUS | Status: DC | PRN
Start: 2022-05-23 — End: 2022-05-23
  Administered 2022-05-23: 80 mg via INTRAVENOUS

## 2022-05-23 MED ORDER — PROMETHAZINE HCL 25 MG/ML IJ SOLN: 6.2500 mg | INTRAMUSCULAR | Status: DC | PRN

## 2022-05-23 MED ORDER — METOPROLOL TARTRATE 25 MG PO TABS
ORAL_TABLET | ORAL | Status: AC
  Filled 2022-05-23: qty 2

## 2022-05-23 MED ORDER — BUPIVACAINE-EPINEPHRINE (PF) 0.25% -1:200000 IJ SOLN
INTRAMUSCULAR | Status: DC | PRN
  Administered 2022-05-23: 20 mL
  Administered 2022-05-23: 10 mL

## 2022-05-23 MED ORDER — MIDAZOLAM HCL 2 MG/2ML IJ SOLN
INTRAMUSCULAR | Status: AC
  Filled 2022-05-23: qty 2

## 2022-05-23 MED ORDER — PROPOFOL 10 MG/ML IV BOLUS
INTRAVENOUS | Status: AC
  Filled 2022-05-23: qty 20

## 2022-05-23 MED ORDER — SODIUM CHLORIDE 0.9 % IR SOLN
Status: DC | PRN
  Administered 2022-05-23: 1000 mL

## 2022-05-23 MED ORDER — CHLORHEXIDINE GLUCONATE 0.12 % MT SOLN
OROMUCOSAL | Status: AC
  Administered 2022-05-23: 15 mL via OROMUCOSAL
  Filled 2022-05-23: qty 15

## 2022-05-23 SURGICAL SUPPLY — 45 items
ADAPTER CATH DIALYSIS 4X8 IT L (MISCELLANEOUS) ×2 IMPLANT
ADH SKN CLS APL DERMABOND .7 (GAUZE/BANDAGES/DRESSINGS) ×1
BIOPATCH WHT 1IN DISK W/4.0 H (GAUZE/BANDAGES/DRESSINGS) ×2 IMPLANT
BLADE CLIPPER SURG (BLADE) ×2 IMPLANT
CATH EXTENDED DIALYSIS (CATHETERS) ×2 IMPLANT
DERMABOND ADVANCED (GAUZE/BANDAGES/DRESSINGS) ×1
DERMABOND ADVANCED .7 DNX12 (GAUZE/BANDAGES/DRESSINGS) ×1 IMPLANT
ELECT REM PT RETURN 9FT ADLT (ELECTROSURGICAL) ×2
ELECTRODE REM PT RTRN 9FT ADLT (ELECTROSURGICAL) ×1 IMPLANT
GLOVE ORTHO TXT STRL SZ7.5 (GLOVE) ×2 IMPLANT
GOWN STRL REUS W/ TWL LRG LVL3 (GOWN DISPOSABLE) ×12 IMPLANT
GOWN STRL REUS W/ TWL XL LVL3 (GOWN DISPOSABLE) ×1 IMPLANT
GOWN STRL REUS W/TWL LRG LVL3 (GOWN DISPOSABLE) ×24
GOWN STRL REUS W/TWL XL LVL3 (GOWN DISPOSABLE) ×2
GRASPER SUT TROCAR 14GX15 (MISCELLANEOUS) ×1 IMPLANT
IV NS 1000ML (IV SOLUTION) ×2
IV NS 1000ML BAXH (IV SOLUTION) ×1 IMPLANT
KIT TURNOVER KIT A (KITS) ×2 IMPLANT
MANIFOLD NEPTUNE II (INSTRUMENTS) ×2 IMPLANT
MINICAP W/POVIDONE IODINE SOL (MISCELLANEOUS) ×2 IMPLANT
NDL INSUFFLATION 14GA 120MM (NEEDLE) IMPLANT
NEEDLE HYPO 22GX1.5 SAFETY (NEEDLE) ×2 IMPLANT
NEEDLE INSUFFLATION 14GA 120MM (NEEDLE) ×2 IMPLANT
NS IRRIG 500ML POUR BTL (IV SOLUTION) ×2 IMPLANT
PACK LAP CHOLECYSTECTOMY (MISCELLANEOUS) ×2 IMPLANT
PAD ABD DERMACEA PRESS 5X9 (GAUZE/BANDAGES/DRESSINGS) ×2 IMPLANT
SET CYSTO W/LG BORE CLAMP LF (SET/KITS/TRAYS/PACK) ×2 IMPLANT
SET TRANSFER 6 W/TWIST CLAMP 5 (SET/KITS/TRAYS/PACK) ×2 IMPLANT
SET TUBE SMOKE EVAC HIGH FLOW (TUBING) ×2 IMPLANT
SPONGE DRAIN TRACH 4X4 STRL 2S (GAUZE/BANDAGES/DRESSINGS) ×2 IMPLANT
STYLET FALLER (MISCELLANEOUS) ×1 IMPLANT
STYLET FALLER MEDIONICS (MISCELLANEOUS) IMPLANT
SUT MNCRL 4-0 (SUTURE) ×2
SUT MNCRL 4-0 27XMFL (SUTURE) ×1
SUT PROLENE 0 CT 2 (SUTURE) ×2 IMPLANT
SUT VIC AB 3-0 PS2 18 (SUTURE) ×1 IMPLANT
SUT VIC AB 3-0 SH 27 (SUTURE) ×2
SUT VIC AB 3-0 SH 27X BRD (SUTURE) ×1 IMPLANT
SUT VICRYL 0 AB UR-6 (SUTURE) IMPLANT
SUT VICRYL 0 TIES 12 18 (SUTURE) ×2 IMPLANT
SUTURE MNCRL 4-0 27XMF (SUTURE) ×1 IMPLANT
SYS KII FIOS ACCESS ABD 5X100 (TROCAR) ×2
SYSTEM KII FIOS ACES ABD 5X100 (TROCAR) ×1 IMPLANT
TROCAR Z-THREAD OPTICAL 5X100M (TROCAR) ×4 IMPLANT
WATER STERILE IRR 500ML POUR (IV SOLUTION) ×2 IMPLANT

## 2022-05-23 NOTE — Anesthesia Postprocedure Evaluation (Signed)
Anesthesia Post Note  Patient: Mark [name]  Procedure(s) Performed: LAPAROSCOPIC INSERTION CONTINUOUS AMBULATORY PERITONEAL DIALYSIS  (CAPD) CATHETER (Abdomen)  Patient location during evaluation: PACU Anesthesia Type: General Level of consciousness: awake and alert Pain management: pain level controlled Vital Signs Assessment: post-procedure vital signs reviewed and stable Respiratory status: spontaneous breathing, nonlabored ventilation, respiratory function stable and patient connected to nasal cannula oxygen Cardiovascular status: blood pressure returned to baseline and stable Postop Assessment: no apparent nausea or vomiting Anesthetic complications: no   No notable events documented.   Last Vitals:  Vitals:   05/23/22 1316 05/23/22 1336  BP: (!) 174/104 (!) 172/100  Pulse: 76 78  Resp: 16 16  Temp: (!) 36.1 C   SpO2: 97% 97%    Last Pain:  Vitals:   05/23/22 1336  TempSrc:   PainSc: 0-No pain                 Martha Clan

## 2022-05-23 NOTE — Anesthesia Procedure Notes (Signed)
Procedure Name: Intubation Date/Time: 05/23/2022 10:39 AM Performed by: Demetrius Charity, CRNA Pre-anesthesia Checklist: Patient identified, Patient being monitored, Timeout performed, Emergency Drugs available and Suction available Patient Re-evaluated:Patient Re-evaluated prior to induction Oxygen Delivery Method: Circle system utilized Preoxygenation: Pre-oxygenation with 100% oxygen Induction Type: IV induction Ventilation: Mask ventilation without difficulty and Oral airway inserted - appropriate to patient size Laryngoscope Size: 3 and McGraph Grade View: Grade I Tube type: Oral Tube size: 7.0 mm Number of attempts: 1 Airway Equipment and Method: Stylet and Video-laryngoscopy Placement Confirmation: ETT inserted through vocal cords under direct vision, positive ETCO2 and breath sounds checked- equal and bilateral Secured at: 23 cm Tube secured with: Tape Dental Injury: Teeth and Oropharynx as per pre-operative assessment

## 2022-05-23 NOTE — Transfer of Care (Signed)
Immediate Anesthesia Transfer of Care Note  Patient: Mark [name]  Procedure(s) Performed: LAPAROSCOPIC INSERTION CONTINUOUS AMBULATORY PERITONEAL DIALYSIS  (CAPD) CATHETER (Abdomen)  Patient Location: PACU  Anesthesia Type:General  Level of Consciousness: awake, alert  and oriented  Airway & Oxygen Therapy: Patient Spontanous Breathing and Patient connected to face mask oxygen  Post-op Assessment: Report given to RN and Post -op Vital signs reviewed and stable  Post vital signs: Reviewed and stable  Last Vitals:  Vitals Value Taken Time  BP 178/102 05/23/22 1206  Temp 36.2 C 05/23/22 1206  Pulse 81 05/23/22 1211  Resp 20 05/23/22 1211  SpO2 97 % 05/23/22 1211  Vitals shown include unvalidated device data.  Last Pain:  Vitals:   05/23/22 1206  TempSrc:   PainSc: Asleep         Complications: No notable events documented.

## 2022-05-23 NOTE — Interval H&P Note (Signed)
History and Physical Interval Note:  05/23/2022 9:37 AM  Tanda Rockers  has presented today for surgery, with the diagnosis of CKD.  The various methods of treatment have been discussed with the patient and family. After consideration of risks, benefits and other options for treatment, the patient has consented to  Procedure(s): [place]  (CAPD) CATHETER (N/A) as a surgical intervention.  The patient's history has been reviewed, patient examined, no change in status, stable for surgery.  I have reviewed the patient's chart and labs.  Questions were answered to the patient's satisfaction.     Ronny Bacon

## 2022-05-23 NOTE — Discharge Instructions (Addendum)

## 2022-05-23 NOTE — Anesthesia Preprocedure Evaluation (Signed)
Anesthesia Evaluation  Patient identified by MRN, date of birth, ID band Patient awake    Reviewed: Allergy & Precautions, H&P , NPO status , Patient's Chart, lab work & pertinent test results, reviewed documented beta blocker date and time   History of Anesthesia Complications (+) PONV and history of anesthetic complications  Airway Mallampati: IV  TM Distance: >3 FB Neck ROM: full    Dental  (+) Dental Advidsory Given   Pulmonary neg shortness of breath, neg COPD, neg recent URI, Current Smoker and Patient abstained from smoking.,    Pulmonary exam normal breath sounds clear to auscultation       Cardiovascular Exercise Tolerance: Good hypertension, (-) angina+ Past MI  (-) Cardiac Stents Normal cardiovascular exam(-) dysrhythmias (-) Valvular Problems/Murmurs Rhythm:regular Rate:Normal     Neuro/Psych PSYCHIATRIC DISORDERS Anxiety Depression negative neurological ROS     GI/Hepatic Neg liver ROS, GERD  ,  Endo/Other  diabetes  Renal/GU ESRFRenal disease  negative genitourinary   Musculoskeletal   Abdominal   Peds  Hematology negative hematology ROS (+)   Anesthesia Other Findings Past Medical History: No date: Anemia 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 No date: CKD stage 5 due to type 2 diabetes mellitus (HCC) No date: Diabetes mellitus without complication (HCC) No date: 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) No date: Diabetic neuropathy (HCC) No date: Diabetic retinopathy (HCC) No date: Gastroparesis No date: GERD (gastroesophageal reflux disease) No date: History of kidney stones 07/08/2016: Myocardial infarction [facility])     Comment:  due to ketoacidosis No date: 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 ([place]) No date: Primary hypertension 05/2019: Retinal detachment     Comment:  left eye 05/2019: Traction retinal detachment, right   Reproductive/Obstetrics negative OB ROS                              Anesthesia Physical Anesthesia Plan  ASA: 4  Anesthesia Plan: General   Post-op Pain Management:    Induction: Intravenous  PONV Risk Score and Plan: 2 and Ondansetron, Dexamethasone, Midazolam, Treatment may vary due to age or medical condition and Promethazine  Airway Management Planned: Oral ETT  Additional Equipment:   Intra-op Plan:   Post-operative Plan: Extubation in OR  Informed Consent: I have reviewed the patients History and Physical, chart, labs and discussed the procedure including the risks, benefits and alternatives for the proposed anesthesia with the patient or authorized representative who has indicated his/her understanding and acceptance.     Dental Advisory Given  Plan Discussed with: Anesthesiologist, CRNA and Surgeon  Anesthesia Plan Comments:         Anesthesia Quick Evaluation

## 2022-05-23 NOTE — Op Note (Signed)
Laparoscopy, continuous ambulatory peritoneal dialysis catheter placement, laparoscopic omentopexy.  Pre-operative Diagnosis: End stage chronic renal failure.   Post-operative Diagnosis: same.   Surgeon: Ronny Bacon, M.D., [facility]  Anesthesia: General endotracheal  Findings: Excellent dialysate flow, omentopexy left upper quadrant.  Estimated Blood Loss: 10 mL         Specimens: None          Complications: none              Procedure Details  The patient was seen again in the Holding Room. The benefits, complications, treatment options, and expected outcomes were discussed with the patient. The risks of bleeding, infection, recurrence of symptoms, failure to resolve symptoms, unanticipated injury, prosthetic placement, prosthetic malfunction, prosthetic infection, any of which could require further surgery were reviewed with the patient. The likelihood of improving the patient's symptoms/condition is good.  The patient and/or family concurred with the proposed plan, giving informed consent.  The patient was taken to Operating Room, identified and the procedure verified.    Prior to the induction of general anesthesia, antibiotic prophylaxis was administered. VTE prophylaxis was in place.  General anesthesia was then administered and tolerated well. After the induction, the patient was positioned in the supine position and the abdomen was prepped with Chloraprep and draped in the sterile fashion.  A Time Out was held and the above information confirmed. After local infiltration of quarter percent Marcaine with epinephrine, stab incision was made left upper quadrant.  Just below the costal margin approximately midclavicular line the Veress needle is passed with sensation of the layers to penetrate the abdominal wall and into the peritoneum.  Saline drop test is confirmed peritoneal placement.  Insufflation is initiated with carbon dioxide to pressures of 15 mmHg. An optical 5 mm trocar was  placed under direct visualization in the right UQ abdominal wall.  Evaluation of the omentum was done, considering the prophylactic utilization of omentopexy.   A second trocar was placed under direct visualization inferiorly on the right side.   With the PMI and a 2-0 Vicryl, I made a loop at the Veress needle site, I placed the distal omentum into the loop and approximated the loop to the anterior abdominal wall to trap the omentum.  This secured the omentum well away from the pelvis. With local infiltration of quarter percent Marcaine with epinephrine a transverse incision was made just cephalad to the umbilical level on the left patient's left rectus.  This allowed the top of the coil to align with the symphysis pubis.  Utilizing a 5 mm applied optical port, I passed the trocar first through the anterior rectus sheath and then tangentially deep to the rectus muscle the maximal distance toward the symphysis prior to penetrating into the peritoneal cavity, all under direct visualization. I then placed the coil of the peritoneal dialysis catheter through this port into the pelvis and advanced the cuff to the anterior rectus sheath.  I then measured the angled catheter for the length to rise and make a lateral turn inferior to the left costal margin, and then to extend laterally and somewhat inferiorly from that point.  These points were marked.  A pursestring of 3-0-Vicryl is used to snug the subcutaneous tissues around the catheter at the periumbilical incision. I made a counterincision where the catheter would arch/middle cuff would lie, cut the catheters after measuring and secured the catheters using the intraluminal connector with Prolene.   I then tunneled the catheter to the counterincision.  Ensuring  the catheter remained without kinks or twists along the subcutaneous course.  I then tracked it in the same subcutaneous plane laterally parallel to the costal margin for skin penetration with the trocar  alone.  The catheter was then brought out through that site. We then evaluated the inflow of the dialysate and its gravitational drainage.  This was free flowing.   Once this was completed we then removed the remaining trochars, along with the CO2, and closed the skin incisions with interrupted and running subcuticulars of 4-0 Monocryl, sealing them all with Dermabond. A biodisk was applied to the catheter exit site, and it was dressed with sponge secured, and the catheter with connectors and betadine cap  atop the primary dressing and covered with ABD.  The catheter was covered with another ABD atop the primary dressing.     Ronny Bacon M.D., [facility] 05/14/2021 9:13 AM

## 2022-06-13 ENCOUNTER — Encounter (INDEPENDENT_AMBULATORY_CARE_PROVIDER_SITE_OTHER): Payer: Self-pay | Admitting: Vascular Surgery

## 2022-06-13 ENCOUNTER — Encounter (INDEPENDENT_AMBULATORY_CARE_PROVIDER_SITE_OTHER): Payer: Self-pay

## 2022-06-13 ENCOUNTER — Other Ambulatory Visit (INDEPENDENT_AMBULATORY_CARE_PROVIDER_SITE_OTHER): Payer: Self-pay

## 2022-07-04 ENCOUNTER — Telehealth (INDEPENDENT_AMBULATORY_CARE_PROVIDER_SITE_OTHER): Payer: Self-pay

## 2022-07-04 NOTE — Telephone Encounter (Signed)
Spoke with the patient and he is scheduled with Dr. Lucky Cowboy on 07/07/22 with a 1:15 pm arrival time to the [facility]. Pre-procedure instructions were discussed and patient understood.

## 2022-07-07 ENCOUNTER — Encounter
Admission: RE | Disposition: A | Discharge status: Home / Self Care | Payer: Self-pay | Source: Home / Self Care | Attending: Vascular Surgery

## 2022-07-07 ENCOUNTER — Encounter: Payer: Self-pay | Admitting: Vascular Surgery

## 2022-07-07 ENCOUNTER — Ambulatory Visit
Admission: RE | Admit: 2022-07-07 | Discharge: 2022-07-07 | Disposition: A | Discharge status: Home / Self Care | Payer: Medicare Other | Attending: Vascular Surgery | Admitting: Vascular Surgery

## 2022-07-07 DIAGNOSIS — F17210 Nicotine dependence, cigarettes, uncomplicated: Secondary | ICD-10-CM | POA: Diagnosis not present

## 2022-07-07 DIAGNOSIS — E1122 Type 2 diabetes mellitus with diabetic chronic kidney disease: Secondary | ICD-10-CM | POA: Diagnosis not present

## 2022-07-07 DIAGNOSIS — I120 Hypertensive chronic kidney disease with stage 5 chronic kidney disease or end stage renal disease: Secondary | ICD-10-CM | POA: Insufficient documentation

## 2022-07-07 DIAGNOSIS — N186 End stage renal disease: Secondary | ICD-10-CM

## 2022-07-07 DIAGNOSIS — Z4901 Encounter for fitting and adjustment of extracorporeal dialysis catheter: Secondary | ICD-10-CM

## 2022-07-07 HISTORY — PX: DIALYSIS/PERMA CATHETER REMOVAL: CATH118289

## 2022-07-07 SURGERY — DIALYSIS/PERMA CATHETER REMOVAL
Anesthesia: LOCAL

## 2022-07-07 SURGICAL SUPPLY — 2 items
FORCEPS HALSTEAD CVD 5IN STRL (INSTRUMENTS) ×1 IMPLANT
TRAY LACERAT/PLASTIC (MISCELLANEOUS) ×1 IMPLANT

## 2022-07-07 NOTE — Op Note (Signed)
Operative Note     Preoperative diagnosis:   1. ESRD with functional permanent access  Postoperative diagnosis:  1. ESRD with functional permanent access  Procedure:  Removal of right jugular Permcath  Surgeon:  Leotis Pain, MD  Anesthesia:  Local  EBL:  Minimal  Indication for the Procedure:  The patient has a functional permanent dialysis access and no longer needs their permcath.  This can be removed.  Risks and benefits are discussed and informed consent is obtained.  Description of the Procedure:  The patient's right neck, chest and existing catheter were sterilely prepped and draped. The area around the catheter was anesthetized copiously with 1% lidocaine. The catheter was dissected out with curved hemostats until the cuff was freed from the surrounding fibrous sheath. The fiber sheath was transected, and the catheter was then removed in its entirety using gentle traction. Pressure was held and sterile dressings were placed. The patient tolerated the procedure well and was taken to the recovery room in stable condition.     Leotis Pain  07/07/2022, 3:10 PM This note was created with Dragon Medical transcription system. Any errors in dictation are purely unintentional.

## 2022-07-07 NOTE — Discharge Instructions (Signed)
Tunneled Catheter Removal, Care After Refer to this sheet in the next few weeks. These instructions provide you with information about caring for yourself after your procedure. Your health care provider may also give you more specific instructions. Your treatment has been planned according to current medical practices, but problems sometimes occur. Call your health care provider if you have any problems or questions after your procedure. What can I expect after the procedure? After the procedure, it is common to have: Some mild redness, swelling, and pain around your catheter site.   Follow these instructions at home: Incision care  Check your removal site  every day for signs of infection. Check for: More redness, swelling, or pain. More fluid or blood. Warmth. Pus or a bad smell. Remove your dressing in 48hrs leave open to air  Activity  Return to your normal activities as told by your health care provider. Ask your health care provider what activities are safe for you. Do not lift anything that is heavier than 10 lb (4.5 kg) for 3 days  You may shower tomorrow  Contact a health care provider if: You have more fluid or blood coming from your removal site You have more redness, swelling, or pain at your incisions or around the area where your catheter was removed Your removal site feel warm to the touch. You feel unusually weak. You feel nauseous.. Get help right away if You have swelling in your arm, shoulder, neck, or face. You develop chest pain. You have difficulty breathing. You feel dizzy or light-headed. You have pus or a bad smell coming from your removal site You have a fever. You develop bleeding from your removal site, and your bleeding does not stop. This information is not intended to replace advice given to you by your health care provider. Make sure you discuss any questions you have with your health care provider. Document Released: 12/01/2012 Document Revised:  08/17/2016 Document Reviewed: 09/10/2015 Elsevier Interactive Patient Education  2017 Elsevier Inc[employer] 
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
## 2022-07-08 ENCOUNTER — Encounter: Payer: Self-pay | Admitting: Vascular Surgery

## 2022-08-07 ENCOUNTER — Encounter: Payer: Self-pay | Admitting: *Deleted

## 2022-08-07 ENCOUNTER — Other Ambulatory Visit: Payer: Self-pay

## 2022-08-07 ENCOUNTER — Inpatient Hospital Stay
Admission: EM | Admit: 2022-08-07 | Discharge: 2022-08-14 | DRG: 291 | Disposition: A | Discharge status: Home / Self Care | Payer: Medicare Other | Attending: Internal Medicine | Admitting: Internal Medicine

## 2022-08-07 DIAGNOSIS — E875 Hyperkalemia: Secondary | ICD-10-CM

## 2022-08-07 DIAGNOSIS — Z7189 Other specified counseling: Secondary | ICD-10-CM | POA: Diagnosis not present

## 2022-08-07 DIAGNOSIS — E1122 Type 2 diabetes mellitus with diabetic chronic kidney disease: Secondary | ICD-10-CM | POA: Diagnosis present

## 2022-08-07 DIAGNOSIS — Z66 Do not resuscitate: Secondary | ICD-10-CM | POA: Diagnosis present

## 2022-08-07 DIAGNOSIS — F17210 Nicotine dependence, cigarettes, uncomplicated: Secondary | ICD-10-CM | POA: Diagnosis present

## 2022-08-07 DIAGNOSIS — H5461 Unqualified visual loss, right eye, normal vision left eye: Secondary | ICD-10-CM | POA: Diagnosis present

## 2022-08-07 DIAGNOSIS — I2510 Atherosclerotic heart disease of native coronary artery without angina pectoris: Secondary | ICD-10-CM | POA: Diagnosis present

## 2022-08-07 DIAGNOSIS — Z794 Long term (current) use of insulin: Secondary | ICD-10-CM

## 2022-08-07 DIAGNOSIS — Z833 Family history of diabetes mellitus: Secondary | ICD-10-CM

## 2022-08-07 DIAGNOSIS — I5032 Chronic diastolic (congestive) heart failure: Secondary | ICD-10-CM | POA: Diagnosis present

## 2022-08-07 DIAGNOSIS — I132 Hypertensive heart and chronic kidney disease with heart failure and with stage 5 chronic kidney disease, or end stage renal disease: Secondary | ICD-10-CM | POA: Diagnosis present

## 2022-08-07 DIAGNOSIS — Z681 Body mass index (BMI) 19 or less, adult: Secondary | ICD-10-CM | POA: Diagnosis not present

## 2022-08-07 DIAGNOSIS — I252 Old myocardial infarction: Secondary | ICD-10-CM | POA: Diagnosis not present

## 2022-08-07 DIAGNOSIS — R197 Diarrhea, unspecified: Secondary | ICD-10-CM | POA: Diagnosis present

## 2022-08-07 DIAGNOSIS — Z635 Disruption of family by separation and divorce: Secondary | ICD-10-CM

## 2022-08-07 DIAGNOSIS — Z888 Allergy status to other drugs, medicaments and biological substances status: Secondary | ICD-10-CM

## 2022-08-07 DIAGNOSIS — E11311 Type 2 diabetes mellitus with unspecified diabetic retinopathy with macular edema: Secondary | ICD-10-CM | POA: Diagnosis present

## 2022-08-07 DIAGNOSIS — E1169 Type 2 diabetes mellitus with other specified complication: Secondary | ICD-10-CM | POA: Diagnosis present

## 2022-08-07 DIAGNOSIS — E872 Acidosis, unspecified: Secondary | ICD-10-CM | POA: Diagnosis present

## 2022-08-07 DIAGNOSIS — K219 Gastro-esophageal reflux disease without esophagitis: Secondary | ICD-10-CM | POA: Diagnosis present

## 2022-08-07 DIAGNOSIS — E1165 Type 2 diabetes mellitus with hyperglycemia: Secondary | ICD-10-CM | POA: Diagnosis present

## 2022-08-07 DIAGNOSIS — Z992 Dependence on renal dialysis: Secondary | ICD-10-CM

## 2022-08-07 DIAGNOSIS — I2583 Coronary atherosclerosis due to lipid rich plaque: Secondary | ICD-10-CM | POA: Diagnosis not present

## 2022-08-07 DIAGNOSIS — Z79899 Other long term (current) drug therapy: Secondary | ICD-10-CM

## 2022-08-07 DIAGNOSIS — F419 Anxiety disorder, unspecified: Secondary | ICD-10-CM | POA: Diagnosis present

## 2022-08-07 DIAGNOSIS — D631 Anemia in chronic kidney disease: Secondary | ICD-10-CM | POA: Diagnosis present

## 2022-08-07 DIAGNOSIS — E1143 Type 2 diabetes mellitus with diabetic autonomic (poly)neuropathy: Secondary | ICD-10-CM | POA: Diagnosis present

## 2022-08-07 DIAGNOSIS — Z87442 Personal history of urinary calculi: Secondary | ICD-10-CM

## 2022-08-07 DIAGNOSIS — Z91158 Patient's noncompliance with renal dialysis for other reason: Secondary | ICD-10-CM | POA: Diagnosis not present

## 2022-08-07 DIAGNOSIS — Z880 Allergy status to penicillin: Secondary | ICD-10-CM

## 2022-08-07 DIAGNOSIS — K3184 Gastroparesis: Secondary | ICD-10-CM | POA: Diagnosis present

## 2022-08-07 DIAGNOSIS — N2581 Secondary hyperparathyroidism of renal origin: Secondary | ICD-10-CM | POA: Diagnosis present

## 2022-08-07 DIAGNOSIS — E43 Unspecified severe protein-calorie malnutrition: Secondary | ICD-10-CM | POA: Diagnosis present

## 2022-08-07 DIAGNOSIS — N186 End stage renal disease: Secondary | ICD-10-CM | POA: Diagnosis present

## 2022-08-07 DIAGNOSIS — F32A Depression, unspecified: Secondary | ICD-10-CM | POA: Diagnosis present

## 2022-08-07 DIAGNOSIS — I10 Essential (primary) hypertension: Secondary | ICD-10-CM

## 2022-08-07 DIAGNOSIS — N189 Chronic kidney disease, unspecified: Secondary | ICD-10-CM | POA: Diagnosis not present

## 2022-08-07 LAB — URINALYSIS, ROUTINE W REFLEX MICROSCOPIC
Bilirubin Urine: NEGATIVE
Glucose, UA: 150 mg/dL — AB
Ketones, ur: NEGATIVE mg/dL
Leukocytes,Ua: NEGATIVE
Nitrite: NEGATIVE
Protein, ur: >=300 mg/dL — AB
Specific Gravity, Urine: 1.014 (ref 1.005–1.030)
pH: 5 (ref 5.0–8.0)

## 2022-08-07 LAB — COMPREHENSIVE METABOLIC PANEL
ALT: 11 U/L (ref 0–44)
AST: 14 U/L — ABNORMAL LOW (ref 15–41)
Albumin: 2.9 g/dL — ABNORMAL LOW (ref 3.5–5.0)
Alkaline Phosphatase: 84 U/L (ref 38–126)
Anion gap: 18 — ABNORMAL HIGH (ref 5–15)
BUN: 106 mg/dL — ABNORMAL HIGH (ref 6–20)
CO2: 15 mmol/L — ABNORMAL LOW (ref 22–32)
Calcium: 7.1 mg/dL — ABNORMAL LOW (ref 8.9–10.3)
Chloride: 110 mmol/L (ref 98–111)
Creatinine, Ser: 17.96 mg/dL — ABNORMAL HIGH (ref 0.61–1.24)
GFR, Estimated: 3 mL/min — ABNORMAL LOW (ref >60)
Glucose, Bld: 74 mg/dL (ref 70–99)
Potassium: 5.3 mmol/L — ABNORMAL HIGH (ref 3.5–5.1)
Sodium: 143 mmol/L (ref 135–145)
Total Bilirubin: 0.8 mg/dL (ref 0.3–1.2)
Total Protein: 6.1 g/dL — ABNORMAL LOW (ref 6.5–8.1)

## 2022-08-07 LAB — CBC
HCT: 31.1 % — ABNORMAL LOW (ref 39.0–52.0)
Hemoglobin: 10.1 g/dL — ABNORMAL LOW (ref 13.0–17.0)
MCH: 29.4 pg (ref 26.0–34.0)
MCHC: 32.5 g/dL (ref 30.0–36.0)
MCV: 90.4 fL (ref 80.0–100.0)
Platelets: 178 10*3/uL (ref 150–400)
RBC: 3.44 MIL/uL — ABNORMAL LOW (ref 4.22–5.81)
RDW: 13.8 % (ref 11.5–15.5)
WBC: 3.5 10*3/uL — ABNORMAL LOW (ref 4.0–10.5)
nRBC: 0 % (ref 0.0–0.2)

## 2022-08-07 LAB — LIPASE, BLOOD: Lipase: 33 U/L (ref 11–51)
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
## 2022-08-07 NOTE — ED Provider Notes (Signed)
[facility] Provider Note    Event Date/Time   First MD Initiated Contact with Patient 08/07/22 1810     (approximate)   History   Abdominal Pain   HPI  Mark [name] is a 41 y.o. male history of end-stage renal disease on peritoneal dialysis presents to the ER from outpatient dialysis center due to significant worsening of his renal function and abnormal labs to the new present to the ER for dialysis.  Patient states he has not done PD in the past 12 days because he is "given up.  ".  Denies any SI or HI.  States is very frustrated that he is still waiting for a kidney.  States that he is tired of feeling sick.     Physical Exam   Triage Vital Signs: ED Triage Vitals  Enc Vitals Group     BP 08/07/22 1547 (!) 155/111     Pulse Rate 08/07/22 1547 70     Resp 08/07/22 1547 16     Temp 08/07/22 1547 97.8 F (36.6 C)     Temp Source 08/07/22 1547 Oral     SpO2 08/07/22 1547 100 %     Weight 08/07/22 1544 147 lb 11.3 oz (67 kg)     Height 08/07/22 1544 5\' 10"  (1.778 m)     Head Circumference --      Peak Flow --      Pain Score 08/07/22 1544 5     Pain Loc --      Pain Edu? --      Excl. in [place]? --     Most recent vital signs: Vitals:   08/07/22 1547 08/07/22 1548  BP: (!) 155/111 (!) 155/111  Pulse: 70 71  Resp: 16 16  Temp: 97.8 F (36.6 C) 97.8 F (36.6 C)  SpO2: 100% 98%     Constitutional: Alert  Eyes: Conjunctivae are normal.  Head: Atraumatic. Nose: No congestion/rhinnorhea. Mouth/Throat: Mucous membranes are moist.   Neck: Painless ROM.  Cardiovascular:   Good peripheral circulation. Respiratory: Normal respiratory effort.  No retractions.  Gastrointestinal: Soft and nontender.  Musculoskeletal:  no deformity Neurologic:  MAE spontaneously. No gross focal neurologic deficits are appreciated.  Skin:  Skin is warm, dry and intact. No rash noted. Psychiatric: Mood and affect are normal. Speech and behavior are  normal.    ED Results / Procedures / Treatments   Labs (all labs ordered are listed, but only abnormal results are displayed) Labs Reviewed  COMPREHENSIVE METABOLIC PANEL - Abnormal; Notable for the following components:      Result Value   Potassium 5.3 (*)    CO2 15 (*)    BUN 106 (*)    Creatinine, Ser 17.96 (*)    Calcium 7.1 (*)    Total Protein 6.1 (*)    Albumin 2.9 (*)    AST 14 (*)    GFR, Estimated 3 (*)    Anion gap 18 (*)    All other components within normal limits  CBC - Abnormal; Notable for the following components:   WBC 3.5 (*)    RBC 3.44 (*)    Hemoglobin 10.1 (*)    HCT 31.1 (*)    All other components within normal limits  LIPASE, BLOOD  URINALYSIS, ROUTINE W REFLEX MICROSCOPIC     EKG     RADIOLOGY    PROCEDURES:  Critical Care performed: No  Procedures   MEDICATIONS ORDERED IN ED: Medications - No  data to display   IMPRESSION / MDM / [place] / ED COURSE  I reviewed the triage vital signs and the nursing notes.                              Differential diagnosis includes, but is not limited to, electrolyte abnormality, renal failure, anemia, SBP, infection  Patient presented to the ER for evaluation of symptoms as described above.  This presenting complaint could reflect a potentially life-threatening illness therefore the patient will be placed on continuous pulse oximetry and telemetry for monitoring.  Laboratory evaluation will be sent to evaluate for the above complaints.   Patient chronically ill-appearing.  Does have evidence of significant acute on chronic renal failure potassium mildly elevated 5.3.  I consulted with Dr. Candiss Norse of nephrology who does recommend admission the hospital for PD dialysis and reassessment.  Patient and family agreeable plan.  I have consulted hospitalist regarding the patient     FINAL CLINICAL IMPRESSION(S) / ED DIAGNOSES   Final diagnoses:  ESRD on dialysis [facility])     Rx / DC  Orders   ED Discharge Orders     None        Note:  This document was prepared using Dragon voice recognition software and may include unintentional dictation errors.    Merlyn Lot, MD 08/07/22 [phone]

## 2022-08-07 NOTE — ED Triage Notes (Signed)
Pt to triage via wheelchair.  Pt has not done dialysis in apprx 12 days.  Pt does peritoneal dialysis.  Pt reports abd pain and vomiting.  Pt alert  speech clear.

## 2022-08-07 NOTE — ED Notes (Signed)
Assisted pt to toilet 

## 2022-08-07 NOTE — ED Notes (Addendum)
Pt presents to ED with c/o abdominal pain. Pt states he has not done dialysis in about 10 days. Pt states he does peritoneal dialysis at home every night. Pt denies any pain at this time. Pt denies Chest pain nor SOB. Abdomen does appear distended.

## 2022-08-08 DIAGNOSIS — E875 Hyperkalemia: Secondary | ICD-10-CM

## 2022-08-08 DIAGNOSIS — F32A Depression, unspecified: Secondary | ICD-10-CM | POA: Diagnosis not present

## 2022-08-08 DIAGNOSIS — Z992 Dependence on renal dialysis: Secondary | ICD-10-CM | POA: Diagnosis not present

## 2022-08-08 DIAGNOSIS — N186 End stage renal disease: Secondary | ICD-10-CM | POA: Diagnosis not present

## 2022-08-08 DIAGNOSIS — Z7189 Other specified counseling: Secondary | ICD-10-CM | POA: Diagnosis not present

## 2022-08-08 LAB — CBC
HCT: 28.5 % — ABNORMAL LOW (ref 39.0–52.0)
Hemoglobin: 9.4 g/dL — ABNORMAL LOW (ref 13.0–17.0)
MCH: 29.5 pg (ref 26.0–34.0)
MCHC: 33 g/dL (ref 30.0–36.0)
MCV: 89.3 fL (ref 80.0–100.0)
Platelets: 160 10*3/uL (ref 150–400)
RBC: 3.19 MIL/uL — ABNORMAL LOW (ref 4.22–5.81)
RDW: 13.9 % (ref 11.5–15.5)
WBC: 3.5 10*3/uL — ABNORMAL LOW (ref 4.0–10.5)
nRBC: 0 % (ref 0.0–0.2)

## 2022-08-08 LAB — COMPREHENSIVE METABOLIC PANEL
ALT: 9 U/L (ref 0–44)
AST: 13 U/L — ABNORMAL LOW (ref 15–41)
Albumin: 2.6 g/dL — ABNORMAL LOW (ref 3.5–5.0)
Alkaline Phosphatase: 76 U/L (ref 38–126)
Anion gap: 17 — ABNORMAL HIGH (ref 5–15)
BUN: 178 mg/dL — ABNORMAL HIGH (ref 6–20)
CO2: 13 mmol/L — ABNORMAL LOW (ref 22–32)
Calcium: 6.8 mg/dL — ABNORMAL LOW (ref 8.9–10.3)
Chloride: 111 mmol/L (ref 98–111)
Creatinine, Ser: 17.57 mg/dL — ABNORMAL HIGH (ref 0.61–1.24)
GFR, Estimated: 3 mL/min — ABNORMAL LOW (ref >60)
Glucose, Bld: 134 mg/dL — ABNORMAL HIGH (ref 70–99)
Potassium: 5.8 mmol/L — ABNORMAL HIGH (ref 3.5–5.1)
Sodium: 141 mmol/L (ref 135–145)
Total Bilirubin: 0.8 mg/dL (ref 0.3–1.2)
Total Protein: 5.4 g/dL — ABNORMAL LOW (ref 6.5–8.1)

## 2022-08-08 LAB — PHOSPHORUS: Phosphorus: >30 mg/dL — ABNORMAL HIGH (ref 2.5–4.6)

## 2022-08-08 LAB — MAGNESIUM: Magnesium: 2.8 mg/dL — ABNORMAL HIGH (ref 1.7–2.4)

## 2022-08-08 LAB — GLUCOSE, CAPILLARY: Glucose-Capillary: 294 mg/dL — ABNORMAL HIGH (ref 70–99)

## 2022-08-08 LAB — HIV ANTIBODY (ROUTINE TESTING W REFLEX): HIV Screen 4th Generation wRfx: NONREACTIVE

## 2022-08-08 LAB — HEPATITIS B SURFACE ANTIGEN: Hepatitis B Surface Ag: NONREACTIVE

## 2022-08-08 MED ORDER — HYDRALAZINE HCL 20 MG/ML IJ SOLN: 10.0000 mg | Freq: Four times a day (QID) | INTRAMUSCULAR | Status: DC | PRN

## 2022-08-08 MED ORDER — ONDANSETRON HCL 4 MG/2ML IJ SOLN
4.0000 mg | Freq: Four times a day (QID) | INTRAMUSCULAR | Status: DC | PRN
  Administered 2022-08-09 – 2022-08-10 (×4): 4 mg via INTRAVENOUS
  Filled 2022-08-08 (×4): qty 2

## 2022-08-08 MED ORDER — BISACODYL 5 MG PO TBEC: 5.0000 mg | DELAYED_RELEASE_TABLET | Freq: Every day | ORAL | Status: DC | PRN

## 2022-08-08 MED ORDER — GENTAMICIN SULFATE 0.1 % EX CREA
1.0000 | TOPICAL_CREAM | Freq: Every day | CUTANEOUS | Status: DC
  Administered 2022-08-08 – 2022-08-09 (×2): 1 via TOPICAL
  Filled 2022-08-08: qty 15

## 2022-08-08 MED ORDER — PATIROMER SORBITEX CALCIUM 8.4 G PO PACK
16.8000 g | PACK | Freq: Once | ORAL | Status: AC
  Administered 2022-08-08: 16.8 g via ORAL
  Filled 2022-08-08: qty 2

## 2022-08-08 MED ORDER — INSULIN ASPART 100 UNIT/ML IJ SOLN
0.0000 [IU] | Freq: Every day | INTRAMUSCULAR | Status: DC
  Administered 2022-08-08: 3 [IU] via SUBCUTANEOUS
  Filled 2022-08-08: qty 1

## 2022-08-08 MED ORDER — ACETAMINOPHEN 650 MG RE SUPP: 650.0000 mg | Freq: Four times a day (QID) | RECTAL | Status: DC | PRN

## 2022-08-08 MED ORDER — SENNOSIDES-DOCUSATE SODIUM 8.6-50 MG PO TABS: 1.0000 | ORAL_TABLET | Freq: Every evening | ORAL | Status: DC | PRN

## 2022-08-08 MED ORDER — ONDANSETRON HCL 4 MG PO TABS
4.0000 mg | ORAL_TABLET | Freq: Four times a day (QID) | ORAL | Status: DC | PRN
  Filled 2022-08-08: qty 1

## 2022-08-08 MED ORDER — ZOLPIDEM TARTRATE 5 MG PO TABS: 5.0000 mg | ORAL_TABLET | Freq: Every evening | ORAL | Status: DC | PRN

## 2022-08-08 MED ORDER — CALCIUM ACETATE (PHOS BINDER) 667 MG PO CAPS
1334.0000 mg | ORAL_CAPSULE | Freq: Three times a day (TID) | ORAL | Status: DC
  Administered 2022-08-10 – 2022-08-14 (×8): 1334 mg via ORAL
  Filled 2022-08-08 (×10): qty 2

## 2022-08-08 MED ORDER — MORPHINE SULFATE (PF) 2 MG/ML IV SOLN: 1.0000 mg | Freq: Four times a day (QID) | INTRAVENOUS | Status: DC | PRN

## 2022-08-08 MED ORDER — ACETAMINOPHEN 325 MG PO TABS: 650.0000 mg | ORAL_TABLET | Freq: Four times a day (QID) | ORAL | Status: DC | PRN

## 2022-08-08 MED ORDER — INSULIN ASPART 100 UNIT/ML IJ SOLN
0.0000 [IU] | Freq: Three times a day (TID) | INTRAMUSCULAR | Status: DC
  Filled 2022-08-08: qty 1

## 2022-08-08 MED ORDER — SODIUM CHLORIDE 0.9% FLUSH
3.0000 mL | Freq: Two times a day (BID) | INTRAVENOUS | Status: DC
  Administered 2022-08-08 – 2022-08-13 (×9): 3 mL via INTRAVENOUS

## 2022-08-08 MED ORDER — HYDROCODONE-ACETAMINOPHEN 5-325 MG PO TABS
1.0000 | ORAL_TABLET | ORAL | Status: DC | PRN
  Administered 2022-08-11: 2 via ORAL
  Filled 2022-08-08: qty 2

## 2022-08-08 MED ORDER — ESCITALOPRAM OXALATE 10 MG PO TABS
20.0000 mg | ORAL_TABLET | Freq: Every day | ORAL | Status: DC
  Administered 2022-08-08 – 2022-08-14 (×5): 20 mg via ORAL
  Filled 2022-08-08 (×6): qty 2

## 2022-08-08 MED ORDER — DELFLEX-LC/1.5% DEXTROSE 344 MOSM/L IP SOLN: INTRAPERITONEAL | Status: DC

## 2022-08-08 MED ORDER — METOPROLOL SUCCINATE ER 25 MG PO TB24
25.0000 mg | ORAL_TABLET | Freq: Every day | ORAL | Status: DC
  Administered 2022-08-08 – 2022-08-14 (×6): 25 mg via ORAL
  Filled 2022-08-08 (×6): qty 1

## 2022-08-08 MED ORDER — FUROSEMIDE 40 MG PO TABS
80.0000 mg | ORAL_TABLET | Freq: Two times a day (BID) | ORAL | Status: DC
  Administered 2022-08-08 – 2022-08-14 (×10): 80 mg via ORAL
  Filled 2022-08-08 (×12): qty 2
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
## 2022-08-08 NOTE — Assessment & Plan Note (Addendum)
-   Resume home furosemide, metoprolol

## 2022-08-08 NOTE — Progress Notes (Signed)
  Progress Note   Patient: Mark [name] DOB: 1981-08-31 DOA: 08/07/2022     1 DOS: the patient was seen and examined on 08/08/2022        Brief hospital course: Mr. Mark [name] is a 41 y.o. M with ESRD on PD, DM, HTN, CAD and anemia of CKD who presented with worsening renal function and uremic symptoms.  Patient has not done his peritoneal dialysis in the last 12 days because he was frustrated with not feeling well and having to be connected nightly.           Assessment and Plan: * ESRD (end stage renal disease) on dialysis [facility]) - Consult nephrology for peritoneal dialysis  Hyperphosphatemia - Phos binders and PD.  Nephrology  Hyperkalemia - PD per nephrology  Type 2 diabetes mellitus with chronic kidney disease, with long-term current use of insulin (HCC) - Sliding scale corrections  Severe malnutrition due to type 2 diabetes mellitus ([place])    Essential hypertension Blood pressure elevated - Resume home furosemide, metoprolol  Chronic diastolic CHF (congestive heart failure) (HCC) Appears euvolemic - Resume home furosemide, metoprolol - Hold arb given hyperkalemia  Diabetic gastroparesis (HCC) No vomiting  Anxiety and depression - Increase Lexapro  Anemia due to chronic kidney disease Hemoglobin lower than previous baseline, no clinical bleeding, due to chronic kidney disease -Aranesp per nephrology  Coronary artery disease due to lipid rich plaque - Resume home furosemide, metoprolol           Subjective: Patient has no complaints, no vomiting, feels generally tired and weak and generalized malaise, but no confusion, no chest pain, no dyspnea, no swelling     Physical Exam: Vitals:   08/08/22 0540 08/08/22 0600 08/08/22 0758 08/08/22 0857  BP:  (!) 151/108 (!) 154/92 (!) 160/110  Pulse: 70 70 73 79  Resp: 10 11 10 18   Temp:  97.6 F (36.4 C) 98.1 F (36.7 C) 98.8 F (37.1 C)  TempSrc:  Oral Oral Oral  SpO2: 98% 98% 97%  97%  Weight:      Height:       Thin adult male, lying in bed, interactive, no acute distress RRR, no murmurs, no peripheral edema Respiratory rate normal, still without rales or wheezes Abdomen soft without tenderness palpation or guarding Attention normal, affect normal, judgment insight appear mildly impaired but at baseline   Data Reviewed: Case discussed with nephrology Basic metabolic panel shows creatinine 17, BUN 106, potassium 5.3 CBC shows hemoglobin down to 9 from baseline of 11      Disposition: Status is: Inpatient Patient presented because he did not want to do dialysis by himself anymore, seeking resources from the hospital.  We will consult TOC, given the period of time without dialysis, nephrology will reinitiate PD for him.  Continued conversations regarding peritoneal dialysis versus hemodialysis        Author: Edwin Dada, MD 08/08/2022 5:09 PM  For on call review [email or web address].

## 2022-08-08 NOTE — ED Notes (Signed)
Pt resting at this time. Respirations even and unlabored. Pt denies any needs.

## 2022-08-08 NOTE — ED Notes (Signed)
Patient allowed me to place IV this morning

## 2022-08-08 NOTE — Assessment & Plan Note (Signed)
No vomiting

## 2022-08-08 NOTE — Progress Notes (Cosign Needed Addendum)
[facility]  ROUNDING NOTE   Subjective:   Mark [name] is a 41 y.o. male with past medical history including hypertension, GERD, diabetes, and end-stage renal disease on peritoneal dialysis.  Patient presents to the emergency department with complaints of abdominal pain and has been admitted for ESRD on dialysis [facility]) [N18.6, Z99.2] ESRD (end stage renal disease) on dialysis ([place]) [N18.6, Z99.2]  Patient is followed by the [facility] clinic, supervised by Dr. Candiss Norse.  Patient reports not performing his nightly peritoneal dialysis treatments for the past 2 weeks due to frustration and feeling overwhelmed.  Patient states he feels overwhelmed with the length of his treatments, 8 to 9 hours nightly.  States he often gets sick after treatment experiencing nausea and cramping.  Appetite remains appropriate without nausea or vomiting.  Denies known fever or chills.  Reports no pain with dialysis treatments.  Labs on ED arrival include sodium 143, potassium 5.3, serum bicarb 15, BUN 106, creatinine 17.96 with GFR 3.  UA positive for hematuria and protein.    Objective:  Vital signs in last 24 hours:  Temp:  [97.3 F (36.3 C)-98.8 F (37.1 C)] 98.8 F (37.1 C) (08/11 0857) Pulse Rate:  [65-79] 79 (08/11 0857) Resp:  [10-18] 18 (08/11 0857) BP: (124-160)/(92-111) 160/110 (08/11 0857) SpO2:  [94 %-100 %] 97 % (08/11 0857) Weight:  [67 kg] 67 kg (08/10 1544)  Weight change:  Filed Weights   08/07/22 1544  Weight: 67 kg    Intake/Output: I/O last 3 completed shifts: In: 591 [P.O.:591] Out: -    Intake/Output this shift:  No intake/output data recorded.  Physical Exam: General: NAD, withdrawn  Head: Normocephalic, atraumatic. Moist oral mucosal membranes  Eyes: Anicteric  Lungs:  Clear to auscultation, normal effort  Heart: Regular rate and rhythm  Abdomen:  Soft, nontender, PD catheter  Extremities:  No peripheral edema.  Neurologic: Nonfocal, moving all four  extremities  Skin: No lesions  Access: Pd catheter    Basic Metabolic Panel: Recent Labs  Lab 08/07/22 1546 08/08/22 0645  NA 143 141  K 5.3* 5.8*  CL 110 111  CO2 15* 13*  GLUCOSE 74 134*  BUN 106* 178*  CREATININE 17.96* 17.57*  CALCIUM 7.1* 6.8*  MG  --  2.8*  PHOS  --  >30.0*    Liver Function Tests: Recent Labs  Lab 08/07/22 1546 08/08/22 0645  AST 14* 13*  ALT 11 9  ALKPHOS 84 76  BILITOT 0.8 0.8  PROT 6.1* 5.4*  ALBUMIN 2.9* 2.6*   Recent Labs  Lab 08/07/22 1546  LIPASE 33   No results for input(s): "AMMONIA" in the last 168 hours.  CBC: Recent Labs  Lab 08/07/22 1546 08/08/22 0645  WBC 3.5* 3.5*  HGB 10.1* 9.4*  HCT 31.1* 28.5*  MCV 90.4 89.3  PLT 178 160    Cardiac Enzymes: No results for input(s): "CKTOTAL", "CKMB", "CKMBINDEX", "TROPONINI" in the last 168 hours.  BNP: Invalid input(s): "POCBNP"  CBG: No results for input(s): "GLUCAP" in the last 168 hours.  Microbiology: Results for orders placed or performed during the hospital encounter of 08/06/16  Urine culture     Status: Abnormal   Collection Time: 08/06/16 11:50 AM   Specimen: Urine, Clean Catch  Result Value Ref Range Status   Specimen Description URINE, CLEAN CATCH  Final   Special Requests NONE  Final   Culture 40,000 COLONIES/mL STAPHYLOCOCCUS AUREUS (A)  Final   Report Status 08/08/2016 FINAL  Final  Organism ID, Bacteria STAPHYLOCOCCUS AUREUS (A)  Final      Susceptibility   Staphylococcus aureus - MIC*    CIPROFLOXACIN <=0.5 SENSITIVE Sensitive     GENTAMICIN <=0.5 SENSITIVE Sensitive     NITROFURANTOIN <=16 SENSITIVE Sensitive     OXACILLIN 0.5 SENSITIVE Sensitive     TETRACYCLINE <=1 SENSITIVE Sensitive     VANCOMYCIN <=0.5 SENSITIVE Sensitive     TRIMETH/SULFA <=10 SENSITIVE Sensitive     CLINDAMYCIN <=0.25 SENSITIVE Sensitive     RIFAMPIN <=0.5 SENSITIVE Sensitive     Inducible Clindamycin NEGATIVE Sensitive     * 40,000 COLONIES/mL STAPHYLOCOCCUS  AUREUS    Coagulation Studies: No results for input(s): "LABPROT", "INR" in the last 72 hours.  Urinalysis: Recent Labs    08/07/22 2015  COLORURINE YELLOW*  LABSPEC 1.014  PHURINE 5.0  GLUCOSEU 150*  HGBUR MODERATE*  BILIRUBINUR NEGATIVE  KETONESUR NEGATIVE  PROTEINUR >=300*  NITRITE NEGATIVE  LEUKOCYTESUR NEGATIVE      Imaging: No results found.   Medications:    dialysis solution 1.5% low-MG/low-CA      gentamicin cream  1 Application Topical Daily   sodium chloride flush  3 mL Intravenous Q12H   acetaminophen **OR** acetaminophen, bisacodyl, hydrALAZINE, HYDROcodone-acetaminophen, morphine injection, ondansetron **OR** ondansetron (ZOFRAN) IV, senna-docusate, zolpidem  Assessment/ Plan:  Mr. Mark [name] is a 41 y.o.  male with past medical history including hypertension, GERD, diabetes, and end-stage renal disease on peritoneal dialysis.  Patient presents to the emergency department with complaints of abdominal pain and has been admitted for ESRD on dialysis ([place]) [N18.6, Z99.2] ESRD (end stage renal disease) on dialysis ([place]) [N18.6, Z99.2]  [facility] PD  End stage renal disease with hyperkalemia on peritoneal dialysis.  Last treatment roughly 12 days ago.  Will provide nightly treatments during this admission.  Patient currently feels frustrated, overwhelmed, and fatigued with peritoneal dialysis.  After speaking with palliative care and multiple members of his care team, patient has decided to transition to hemodialysis.  We will continue to provide peritoneal dialysis through the weekend and consult vascular surgery for placement of PermCath early next week.  Renal navigator aware and will begin outpatient clinic placement next week.  Potassium 5.8 today, ordered [place].  2. Secondary Hyperparathyroidism:  Lab Results  Component Value Date   CALCIUM 6.8 (L) 08/08/2022   CAION 1.07 (L) 05/23/2022   PHOS >30.0 (H) 08/08/2022    Phosphorus extremely elevated.   We will continue calcium acetate binders with meals and manage with dialysis treatments.  3. Anemia of chronic kidney disease/ Lab Results  Component Value Date   HGB 9.4 (L) 08/08/2022    Hemoglobin within acceptable range.  We will continue to monitor.  No need for ESA's at this time.  4. Hypertension with chronic kidney disease. Home regimen includes furosemide, losartan, and metoprolol. All currently held. Blood pressure elevated, 160/110.   LOS: 1 Blaiden Werth 8/11/20232:24 PM

## 2022-08-08 NOTE — Assessment & Plan Note (Addendum)
Blood pressure slightly elevated - Continue home furosemide, metoprolol, losartan

## 2022-08-08 NOTE — Assessment & Plan Note (Addendum)
Hemoglobin lower than previous baseline, no clinical bleeding, due to chronic kidney disease -Aranesp per nephrology

## 2022-08-08 NOTE — ED Notes (Signed)
Pt resting at this time. Pt denies any needs at this time. Pt given a warm blanket.

## 2022-08-08 NOTE — Assessment & Plan Note (Signed)
-   Phos binders and PD.  Nephrology

## 2022-08-08 NOTE — ED Notes (Signed)
Pt given a sandwich tray and gingerale. Dr. Rosezena Sensor gave okay for pt to eat.

## 2022-08-08 NOTE — ED Notes (Signed)
Patient is refusing to be stuck for an IV at this time. No other access. Patient educated on importance of having access, but states that he wants to hold off for now

## 2022-08-08 NOTE — Progress Notes (Signed)
Started PD with cycler on the pt. Initial drain got about 1300 ml of fluid. Dwelling with no problems. Will follow up in the morning.

## 2022-08-08 NOTE — Assessment & Plan Note (Signed)
Resolved with PD - Resume ARB

## 2022-08-08 NOTE — Assessment & Plan Note (Signed)
Psychiatry consulted, they recommend increasing Lexapro, no inpatient treatment needed.   - Continue new dose Lexapro

## 2022-08-08 NOTE — Assessment & Plan Note (Addendum)
Mildly swollen, no dyspnea, no pulmonary edema - Continue home furosemide, metoprolol, losartan  - Dialysis plans per nephrology
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
## 2022-08-08 NOTE — Hospital Course (Signed)
Mark [name] is a 41 y.o. M with ESRD on PD, DM, HTN, CAD and anemia of CKD who presented with worsening renal function and uremic symptoms.  Patient has not done his peritoneal dialysis in the last 12 days because he was frustrated with not feeling well and having to be connected nightly.

## 2022-08-08 NOTE — ED Notes (Signed)
Informed RN bed assigned 

## 2022-08-08 NOTE — Consult Note (Cosign Needed Addendum)
Consultation Note Date: 08/08/2022   Patient Name: Mark [name]  DOB: 09/21/1981  MRN: [id number]  Age / Sex: 41 y.o., male  PCP: Pcp, No Referring Physician: Harvie Bridge, DO  Reason for Consultation: Establishing goals of care  HPI/Patient Profile: Mark [name] is a 41 y.o. male with a known history of end-stage renal disease on peritoneal dialysis daily at home, diabetes, GERD, MI, hypertension, chronic anemia presents to the emergency department for evaluation of worsening renal function.  Patient has not done his peritoneal dialysis in the last 12 days because he was frustrated with not feeling well and having to be connected nightly.  He reports decreased PO intake, N/V, diarrhea, abdominal pain.  Clinical Assessment and Goals of Care: Notes and labs reviewed. In to see patient. No family at bedside. He is eating late lunch. He states he has been separated from his wife for the past 1.5 years. He states he has been living with his mother. He tells me he has a 55 year old and 3 year old child. Upon discussing a surrogate decision maker, and the ability to complete H POA papers, he states he would be amenable for his wife from whom he is separated to be his surrogate decision maker for his healthcare, as he states he believes that she would make the proper decisions for him if needed.  He states that he has been doing PD for while now.  He states that he believes he is on a transplant recipient list but cannot provide the hospital system that he is on the list with, or any details.  He states that he initially did HD with [facility], and then began to be followed by [facility] dialysis.  He states that it was recommended that he switch to PD.  He states that he does not like PD, because he constantly feels very badly.  He states when he did HD, he felt the same immediately following dialysis, but then felt good for  the days between dialysis sessions, and so there was time that he felt good.   We discussed his diagnosis, prognosis, GOC, EOL wishes disposition and options.  Created space and opportunity for patient  to explore thoughts and feelings regarding current medical information.   A detailed discussion was had today regarding advanced directives.  Concepts specific to code status, artifical feeding and hydration, IV antibiotics and rehospitalization were discussed.  The difference between an aggressive medical intervention path and a comfort care path was discussed.  Values and goals of care important to patient and family were attempted to be elicited.  Discussed limitations of medical interventions to prolong quality of life in some situations and discussed the concept of human mortality.  We discussed suffering and quality of life.  He states because of his age he needs to continue life-prolonging care to try to live longer.  He states that he no longer wants to do peritoneal dialysis.  He states he would like to initiate and continue hemodialysis.  He states he would like  to be formally placed on a kidney recipient list.  Discussed the importance of following the guidelines of the organ donation service while waiting for an organ transplant.   Upon discussing further goals of care, he states that he would want to be placed on a ventilator for up to a total of 2 months.  He states he does not want to live in a vegetative state, but would be amenable to a tracheostomy during that 31-month time period.  He states he does not want CPR, or any care involved with CPR. He states "when he calls you home, you go home." He states that he would never want a feeding tube.    Spoke with Dr. Zollie Scale to discuss patient's wishes for HD and to be placed on a transplant list.  Nephrology will speak to patient further.  I completed a MOST form today with patient and the signed original was placed in the chart. The form was  scanned and sent to medical records for it to be uploaded under ACP tab in Epic. A photocopy was also placed in the chart to be scanned into EMR. The patient outlined their wishes for the following treatment decisions:  Cardiopulmonary Resuscitation: Do Not Attempt Resuscitation (DNR/No CPR)  Medical Interventions: Full Scope of Treatment: Use intubation, advanced airway interventions, mechanical ventilation, cardioversion as indicated, medical treatment, IV fluids, etc, also provide comfort measures. Transfer to the hospital if indicated Would want ventilator support for up to 2 months only.   Antibiotics: Antibiotics if indicated  IV Fluids: IV fluids if indicated  Feeding Tube: No feeding tube        SUMMARY OF RECOMMENDATIONS    Patient would like to initiate hemodialysis and stop peritoneal dialysis. Would want ventilator support for up to 2 months, and he understands a tracheostomy would be needed during this timeframe. Would not want CPR if he is pulseless and apneic.  Would want his wife with whom he is separated from to remain his surrogate decision maker.       Primary Diagnoses: Present on Admission:  Anemia due to chronic kidney disease  Anxiety and depression  Diabetic gastroparesis (HCC)  Chronic diastolic CHF (congestive heart failure) (HCC)  Severe malnutrition due to type 2 diabetes mellitus ([place])  Coronary artery disease due to lipid rich plaque   I have reviewed the medical record, interviewed the patient and family, and examined the patient. The following aspects are pertinent.  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)    Diabetic neuropathy (HCC)    Diabetic retinopathy ([place])    Gastroparesis    GERD (gastroesophageal reflux disease)    History of kidney stones    Myocardial  infarction ([place]) 07/08/2016   due to ketoacidosis   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 ([place])    Primary hypertension    Retinal detachment 05/2019   left eye   Traction retinal detachment, 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   Social History   Socioeconomic History   Marital status: Single    Spouse name: Not on file   Number of children: 2   Years of education: Not on file   Highest education level: Not on file  Occupational History   Not on file  Tobacco Use   Smoking status: Every Day  Packs/day: 0.50    Types: Cigarettes   Smokeless tobacco: Never  Vaping Use   Vaping Use: Never used  Substance and Sexual Activity   Alcohol use: No   Drug use: Not Currently   Sexual activity: Not on file  Other Topics Concern   Not on file  Social History Narrative   Lives alone   Social Determinants of Health   Financial Resource Strain: Not on file  Food Insecurity: Not on file  Transportation Needs: Not on file  Physical Activity: Not on file  Stress: Not on file  Social Connections: Not on file   Family History  Problem Relation Age of Onset   Diabetes Other    Scheduled Meds:  gentamicin cream  1 Application Topical Daily   patiromer  16.8 g Oral Once   sodium chloride flush  3 mL Intravenous Q12H   Continuous Infusions:  dialysis solution 1.5% low-MG/low-CA     PRN Meds:.acetaminophen **OR** acetaminophen, bisacodyl, hydrALAZINE, HYDROcodone-acetaminophen, morphine injection, ondansetron **OR** ondansetron (ZOFRAN) IV, senna-docusate, zolpidem Medications Prior to Admission:  Prior to Admission medications   Medication Sig Start Date End Date Taking? Authorizing Provider  calcium acetate, Phos Binder, (PHOSLYRA) 667 MG/5ML SOLN Take by mouth 3 (three) times daily with meals.   Yes [provider]  escitalopram (LEXAPRO) 10 MG tablet Take 10 mg by mouth daily. 06/16/22  Yes [provider]  gentamicin cream (GARAMYCIN) 0.1 % Apply topically daily. 06/24/22   Yes [provider]  LASIX 80 MG tablet Take 80 mg by mouth 2 (two) times daily. 07/23/22  Yes [provider]  losartan (COZAAR) 100 MG tablet SMARTSIG:1 Tablet(s) By Mouth Every Evening 06/06/22  Yes [provider]  metoprolol succinate (TOPROL-XL) 25 MG 24 hr tablet Take 25 mg by mouth daily. 06/16/22  Yes [provider]  HYDROcodone-acetaminophen (NORCO/VICODIN) 5-325 MG tablet Take 1 tablet by mouth every 6 (six) hours as needed for moderate pain. Patient not taking: Reported on 08/07/2022 05/23/22   Ronny Bacon, MD  losartan (COZAAR) 50 MG tablet Take 50 mg by mouth daily. Patient not taking: Reported on 08/07/2022 05/16/22   [provider]  metoprolol-hydrochlorothiazide (LOPRESSOR HCT) 50-25 MG tablet Take 1 tablet by mouth daily. Patient not taking: Reported on 08/07/2022    [provider]   Allergies  Allergen Reactions   Metformin Nausea And Vomiting    Stomach issues per pt   Penicillins Rash   Review of Systems  Gastrointestinal:  Positive for abdominal pain.    Physical Exam Pulmonary:     Effort: Pulmonary effort is normal.  Neurological:     Mental Status: He is alert.     Vital Signs: BP (!) 160/110 (BP Location: Left Arm)   Pulse 79   Temp 98.8 F (37.1 C) (Oral)   Resp 18   Ht 5\' 10"  (1.778 m)   Wt 67 kg   SpO2 97%   BMI 21.19 kg/m  Pain Scale: 0-10   Pain Score: 3    SpO2: SpO2: 97 % O2 Device:SpO2: 97 % O2 Flow Rate: .   IO: Intake/output summary:  Intake/Output Summary (Last 24 hours) at 08/08/2022 1514 Last data filed at 08/07/2022 2211 Gross per 24 hour  Intake 591 ml  Output --  Net 591 ml    LBM: Last BM Date : 08/08/22 Baseline Weight: Weight: 67 kg Most recent weight: Weight: 67 kg      Signed by: Asencion Gowda, NP   Please contact  Palliative Medicine Team phone at [phone] for questions and concerns.  For individual provider: See Shea Evans

## 2022-08-08 NOTE — Assessment & Plan Note (Signed)
Patient admitted with acidosis and uremia after stopping his peritoneal dialysis 2 weeks prior to admission.  With regard to the reason for stopping peritoneal dialysis, he felt "overwhelmed", and was seeking additional resources to help do his peritoneal dialysis for him at home.  TOC were consulted.  Dialysis coordinator was enlisted, but no such resources could be found.  After discussion with nephrology and palliative care, the patient elected to transition to hemodialysis.  - PD per nephrology - Plan for Vas-Cath today, then initiation of hemodialysis

## 2022-08-08 NOTE — Assessment & Plan Note (Signed)
Glucose low this morning - Start small dextrose infusion until he is able to take p.o.

## 2022-08-09 ENCOUNTER — Other Ambulatory Visit (HOSPITAL_COMMUNITY): Payer: Self-pay

## 2022-08-09 DIAGNOSIS — I5032 Chronic diastolic (congestive) heart failure: Secondary | ICD-10-CM

## 2022-08-09 DIAGNOSIS — F32A Depression, unspecified: Secondary | ICD-10-CM

## 2022-08-09 DIAGNOSIS — N189 Chronic kidney disease, unspecified: Secondary | ICD-10-CM | POA: Diagnosis not present

## 2022-08-09 DIAGNOSIS — F419 Anxiety disorder, unspecified: Secondary | ICD-10-CM | POA: Diagnosis not present

## 2022-08-09 DIAGNOSIS — D631 Anemia in chronic kidney disease: Secondary | ICD-10-CM

## 2022-08-09 DIAGNOSIS — N186 End stage renal disease: Secondary | ICD-10-CM | POA: Diagnosis not present

## 2022-08-09 DIAGNOSIS — I2583 Coronary atherosclerosis due to lipid rich plaque: Secondary | ICD-10-CM

## 2022-08-09 DIAGNOSIS — I2510 Atherosclerotic heart disease of native coronary artery without angina pectoris: Secondary | ICD-10-CM

## 2022-08-09 LAB — GLUCOSE, CAPILLARY
Glucose-Capillary: 98 mg/dL (ref 70–99)
Glucose-Capillary: 104 mg/dL — ABNORMAL HIGH (ref 70–99)
Glucose-Capillary: 86 mg/dL (ref 70–99)
Glucose-Capillary: 113 mg/dL — ABNORMAL HIGH (ref 70–99)

## 2022-08-09 LAB — RENAL FUNCTION PANEL
Albumin: 2.6 g/dL — ABNORMAL LOW (ref 3.5–5.0)
Anion gap: 15 (ref 5–15)
BUN: 150 mg/dL — ABNORMAL HIGH (ref 6–20)
CO2: 18 mmol/L — ABNORMAL LOW (ref 22–32)
Calcium: 6.8 mg/dL — ABNORMAL LOW (ref 8.9–10.3)
Chloride: 107 mmol/L (ref 98–111)
Creatinine, Ser: 16.92 mg/dL — ABNORMAL HIGH (ref 0.61–1.24)
GFR, Estimated: 3 mL/min — ABNORMAL LOW (ref >60)
Glucose, Bld: 90 mg/dL (ref 70–99)
Phosphorus: >30 mg/dL — ABNORMAL HIGH (ref 2.5–4.6)
Potassium: 5 mmol/L (ref 3.5–5.1)
Sodium: 140 mmol/L (ref 135–145)

## 2022-08-09 LAB — HEPATITIS B SURFACE ANTIBODY, QUANTITATIVE: Hep B S AB Quant (Post): 48.6 m[IU]/mL (ref >9.9)

## 2022-08-09 MED ORDER — SODIUM CHLORIDE 0.9 % IV SOLN
12.5000 mg | Freq: Four times a day (QID) | INTRAVENOUS | Status: DC | PRN
  Administered 2022-08-09 – 2022-08-10 (×3): 12.5 mg via INTRAVENOUS
  Filled 2022-08-09 (×2): qty 0.5
  Filled 2022-08-09: qty 12.5

## 2022-08-09 MED ORDER — GENTAMICIN SULFATE 0.1 % EX CREA
1.0000 | TOPICAL_CREAM | Freq: Every day | CUTANEOUS | Status: DC
  Administered 2022-08-09: 1 via TOPICAL
  Filled 2022-08-09: qty 15

## 2022-08-09 NOTE — Progress Notes (Signed)
Pd machine beeping, attempted to reset unsuccessfully, attempted to call on call dialysis nurse unsuccessfully, machine turned off.

## 2022-08-09 NOTE — Progress Notes (Signed)
Pt completed 3 cycles out of 4 due to drain bag being too full. Did a manual drain on pt after removing him from cycler and got about 42ml of effluent.  PT's pd cath working fine.

## 2022-08-09 NOTE — Progress Notes (Signed)
  Progress Note   Patient: Mark [name] XIP:[id number] DOB: [birth year] DOA: 08/07/2022     2 DOS: the patient was seen and examined on 08/09/2022       Brief hospital course: Mark [name] is a 41 y.o. M with ESRD on PD, DM, HTN, CAD and anemia of CKD who presented with worsening renal function and uremic symptoms.  Patient has not done his peritoneal dialysis in the last 12 days because he was frustrated with not feeling well and having to be connected nightly.           Assessment and Plan: * ESRD (end stage renal disease) on dialysis [facility]) - Consult nephrology for peritoneal dialysis - Defer HD vs PD to Nephrology  Hyperphosphatemia - Phos binders and PD per Nephrology  Hyperkalemia Improved ovenright, K still 5  Type 2 diabetes mellitus with chronic kidney disease, with long-term current use of insulin (HCC) Glucose controlled - Sliding scale corrections  Severe malnutrition due to type 2 diabetes mellitus ([place])     Essential hypertension Blood pressure slightly elevated - Continue home furosemide, metoprolol - Hold ARB with hyperkalemia until better  Chronic diastolic CHF (congestive heart failure) (HCC) Mildly swollen, no dyspnea, no pulmonary edema - Continue furosemide, metoprolol - Hold ARB for now - Dialysis plans per nephrology   Diabetic gastroparesis ([place]) Having some vomiting today, likely from uremia. - ANtiemetics as needed  Anxiety and depression Psychiatry consulted, they recommend increasing Lexapro, no inpatient treatment needed.   - Continue new dose Lexapro  Anemia due to chronic kidney disease Hemoglobin lower than previous baseline, no clinical bleeding, due to chronic kidney disease -Aranesp per nephrology  Coronary artery disease due to lipid rich plaque - Continue home furosemide, metoprolol           Subjective: Patient is nauseated.  He has some peripheral swelling.  He has no confusion, no chest pain, no abdominal  pain.     Physical Exam: Vitals:   08/08/22 0857 08/08/22 1936 08/09/22 0339 08/09/22 0825  BP: (!) 160/110 (!) 152/103 (!) 139/97 (!) 150/99  Pulse: 79 74 69 62  Resp: 18 15 15 16   Temp: 98.8 F (37.1 C) 97.9 F (36.6 C) (!) 97.1 F (36.2 C) 98.9 F (37.2 C)  TempSrc: Oral Oral Axillary   SpO2: 97% 95% 96% 100%  Weight:      Height:       Cachectic adult male, lying in bed, no obvious distress, appears uncomfortable due to nausea RRR, no murmurs, no peripheral edema Respiratory rate normal, lungs clear without rales or wheezes Patient has not bad odor in the room, both feet appear normal if slightly swollen Attention normal, affect blunted, judgment and insight appear impaired, face symmetric, speech fluent   Data Reviewed: Basic metabolic panel reviewed, creatinine 16, BUN down to 150, calcium 6.8, phosphorus greater than 30, potassium down to 5      Disposition: Status is: Inpatient Patient was admitted with missed dialysis.  He still has abnormal electrolytes and edema and nephrology recommend ongoing dialysis.  There is also question of whether we will transition to hemodialysis, which is pending Nephrology decision        Author: Edwin Dada, MD 08/09/2022 1:56 PM  For on call review [email or web address].

## 2022-08-09 NOTE — Progress Notes (Signed)
PD tx started, endorsed to floor R.N. will follow up tomorrow. 

## 2022-08-09 NOTE — Progress Notes (Signed)
[facility]  PROGRESS NOTE   Subjective:   Patient seen at bedside.  He has been complaining of worsening nausea. Patient had 3 cycles of dialysis last night.  Patient was evaluated by peritoneal dialysis nurse. Patient had been noncompliant with his dialysis treatments.  His phosphorus was very high.  He was started on calcium acetate.  Objective:  Vital signs: Blood pressure (!) 150/99, pulse 62, temperature 98.9 F (37.2 C), resp. rate 16, height 5\' 10"  (1.778 m), weight 67 kg, SpO2 100 %.  Intake/Output Summary (Last 24 hours) at 08/09/2022 1413 Last data filed at 08/08/2022 1626 Gross per 24 hour  Intake 3 ml  Output --  Net 3 ml   Filed Weights   08/07/22 1544  Weight: 67 kg     Physical Exam: General:  No acute distress  Head:  Normocephalic, atraumatic. Moist oral mucosal membranes  Eyes:  Anicteric  Neck:  Supple  Lungs:   Clear to auscultation, normal effort  Heart:  S1S2 no rubs  Abdomen:   Soft, nontender, bowel sounds present  Extremities:  peripheral edema.  Neurologic:  Awake, alert, following commands  Skin:  No lesions  Access:     Basic Metabolic Panel: Recent Labs  Lab 08/07/22 1546 08/08/22 0645 08/09/22 0730  NA 143 141 140  K 5.3* 5.8* 5.0  CL 110 111 107  CO2 15* 13* 18*  GLUCOSE 74 134* 90  BUN 106* 178* 150*  CREATININE 17.96* 17.57* 16.92*  CALCIUM 7.1* 6.8* 6.8*  MG  --  2.8*  --   PHOS  --  >30.0* >30.0*    CBC: Recent Labs  Lab 08/07/22 1546 08/08/22 0645  WBC 3.5* 3.5*  HGB 10.1* 9.4*  HCT 31.1* 28.5*  MCV 90.4 89.3  PLT 178 160     Urinalysis: Recent Labs    08/07/22 2015  COLORURINE YELLOW*  LABSPEC 1.014  PHURINE 5.0  GLUCOSEU 150*  HGBUR MODERATE*  BILIRUBINUR NEGATIVE  KETONESUR NEGATIVE  PROTEINUR >=300*  NITRITE NEGATIVE  LEUKOCYTESUR NEGATIVE      Imaging: No results found.   Medications:    dialysis solution 1.5% low-MG/low-CA      calcium acetate  1,334 mg Oral TID WC    escitalopram  20 mg Oral Daily   furosemide  80 mg Oral BID   gentamicin cream  1 Application Topical Daily   insulin aspart  0-5 Units Subcutaneous QHS   insulin aspart  0-9 Units Subcutaneous TID WC   metoprolol succinate  25 mg Oral Daily   sodium chloride flush  3 mL Intravenous Q12H    Assessment/ Plan:     Principal Problem:   ESRD (end stage renal disease) on dialysis [facility]) Active Problems:   Coronary artery disease due to lipid rich plaque   Anemia due to chronic kidney disease   Anxiety and depression   Diabetic gastroparesis (HCC)   Chronic diastolic CHF (congestive heart failure) (HCC)   Essential hypertension   Severe malnutrition due to type 2 diabetes mellitus ([place])   Type 2 diabetes mellitus with chronic kidney disease, with long-term current use of insulin (HCC)   Hyperkalemia   Hyperphosphatemia  Is a 41 year old white male with a history of hypertension, diabetes, gastroesophageal reflux disease, end-stage renal disease on peritoneal dialysis.  Patient was admitted due to his noncompliance with his dialysis treatment.  He would like to pursue hemodialysis treatment.  #1: End-stage renal disease: Patient has been on peritoneal dialysis.  Catheter has been  working well.  Patient did not perform his dialysis treatments though he was uremic and also has hyperkalemia.  His potassium has improved now.  We will continue peritoneal dialysis over the weekend.  We will have vascular put in a permacath and start him on hemodialysis.  #2: Second hyperparathyroidism: Patient has hyperphosphatemia.  We will continue the calcium as sedated at the present doses.  #3: Hypertension: We will continue the metoprolol at the present doses.  #4: Diabetes: Continue insulin as ordered.  #5: Congestive heart failure: We will continue the Lasix 80 mg twice a day.  #6: Nausea: We can start him on Zofran on as-needed basis.  We will continue to monitor closely.   LOS: [place], MD [facility] 8/12/20232:13 PM

## 2022-08-10 DIAGNOSIS — N186 End stage renal disease: Secondary | ICD-10-CM | POA: Diagnosis not present

## 2022-08-10 DIAGNOSIS — F419 Anxiety disorder, unspecified: Secondary | ICD-10-CM | POA: Diagnosis not present

## 2022-08-10 DIAGNOSIS — I5032 Chronic diastolic (congestive) heart failure: Secondary | ICD-10-CM | POA: Diagnosis not present

## 2022-08-10 DIAGNOSIS — N189 Chronic kidney disease, unspecified: Secondary | ICD-10-CM | POA: Diagnosis not present

## 2022-08-10 LAB — CBC
HCT: 27.3 % — ABNORMAL LOW (ref 39.0–52.0)
Hemoglobin: 9.2 g/dL — ABNORMAL LOW (ref 13.0–17.0)
MCH: 29.8 pg (ref 26.0–34.0)
MCHC: 33.7 g/dL (ref 30.0–36.0)
MCV: 88.3 fL (ref 80.0–100.0)
Platelets: 147 10*3/uL — ABNORMAL LOW (ref 150–400)
RBC: 3.09 MIL/uL — ABNORMAL LOW (ref 4.22–5.81)
RDW: 13.6 % (ref 11.5–15.5)
WBC: 3.2 10*3/uL — ABNORMAL LOW (ref 4.0–10.5)
nRBC: 0 % (ref 0.0–0.2)

## 2022-08-10 LAB — BASIC METABOLIC PANEL WITH GFR
Anion gap: 12 (ref 5–15)
BUN: 132 mg/dL — ABNORMAL HIGH (ref 6–20)
CO2: 20 mmol/L — ABNORMAL LOW (ref 22–32)
Calcium: 6.7 mg/dL — ABNORMAL LOW (ref 8.9–10.3)
Chloride: 108 mmol/L (ref 98–111)
Creatinine, Ser: 14.77 mg/dL — ABNORMAL HIGH (ref 0.61–1.24)
GFR, Estimated: 4 mL/min — ABNORMAL LOW
Glucose, Bld: 172 mg/dL — ABNORMAL HIGH (ref 70–99)
Potassium: 3.8 mmol/L (ref 3.5–5.1)
Sodium: 140 mmol/L (ref 135–145)

## 2022-08-10 LAB — GLUCOSE, CAPILLARY
Glucose-Capillary: 126 mg/dL — ABNORMAL HIGH (ref 70–99)
Glucose-Capillary: 96 mg/dL (ref 70–99)
Glucose-Capillary: 78 mg/dL (ref 70–99)
Glucose-Capillary: 95 mg/dL (ref 70–99)

## 2022-08-10 MED ORDER — SODIUM CHLORIDE 0.9 % IV SOLN: INTRAVENOUS | Status: DC | PRN

## 2022-08-10 MED ORDER — METOCLOPRAMIDE HCL 5 MG/ML IJ SOLN
10.0000 mg | Freq: Three times a day (TID) | INTRAMUSCULAR | Status: DC | PRN
  Administered 2022-08-10 – 2022-08-12 (×3): 10 mg via INTRAVENOUS
  Filled 2022-08-10 (×3): qty 2

## 2022-08-10 MED ORDER — LOSARTAN POTASSIUM 50 MG PO TABS
100.0000 mg | ORAL_TABLET | Freq: Every day | ORAL | Status: DC
  Administered 2022-08-10 – 2022-08-14 (×4): 100 mg via ORAL
  Filled 2022-08-10 (×4): qty 2

## 2022-08-10 MED ORDER — SODIUM CHLORIDE 0.9 % IV SOLN
12.5000 mg | Freq: Four times a day (QID) | INTRAVENOUS | Status: DC | PRN
  Administered 2022-08-10 – 2022-08-13 (×4): 12.5 mg via INTRAVENOUS
  Filled 2022-08-10: qty 12.5
  Filled 2022-08-10 (×2): qty 0.5
  Filled 2022-08-10: qty 12.5
  Filled 2022-08-10: qty 0.5

## 2022-08-10 NOTE — Progress Notes (Signed)
[facility]  PROGRESS NOTE   Subjective:   Patient is less nauseous today.  Had done 3 cycles of PD last night.  Objective:  Vital signs: Blood pressure (!) 139/94, pulse (!) 56, temperature (!) 97.2 F (36.2 C), temperature source Oral, resp. rate 18, height 5\' 10"  (1.778 m), weight 67 kg, SpO2 94 %.  Intake/Output Summary (Last 24 hours) at 08/10/2022 2204 Last data filed at 08/10/2022 0500 Gross per 24 hour  Intake 168.96 ml  Output --  Net 168.96 ml   Filed Weights   08/07/22 1544  Weight: 67 kg     Physical Exam: General:  No acute distress  Head:  Normocephalic, atraumatic. Moist oral mucosal membranes  Eyes:  Anicteric  Neck:  Supple  Lungs:   Clear to auscultation, normal effort  Heart:  S1S2 no rubs  Abdomen:   Soft, nontender, bowel sounds present  Extremities:  peripheral edema.  Neurologic:  Awake, alert, following commands  Skin:  No lesions  Access:     Basic Metabolic Panel: Recent Labs  Lab 08/07/22 1546 08/08/22 0645 08/09/22 0730 08/10/22 0440  NA 143 141 140 140  K 5.3* 5.8* 5.0 3.8  CL 110 111 107 108  CO2 15* 13* 18* 20*  GLUCOSE 74 134* 90 172*  BUN 106* 178* 150* 132*  CREATININE 17.96* 17.57* 16.92* 14.77*  CALCIUM 7.1* 6.8* 6.8* 6.7*  MG  --  2.8*  --   --   PHOS  --  >30.0* >30.0*  --     CBC: Recent Labs  Lab 08/07/22 1546 08/08/22 0645 08/10/22 0440  WBC 3.5* 3.5* 3.2*  HGB 10.1* 9.4* 9.2*  HCT 31.1* 28.5* 27.3*  MCV 90.4 89.3 88.3  PLT 178 160 147*     Urinalysis: No results for input(s): "COLORURINE", "LABSPEC", "PHURINE", "GLUCOSEU", "HGBUR", "BILIRUBINUR", "KETONESUR", "PROTEINUR", "UROBILINOGEN", "NITRITE", "LEUKOCYTESUR" in the last 72 hours.  Invalid input(s): "APPERANCEUR"    Imaging: No results found.   Medications:    sodium chloride 10 mL/hr at 08/10/22 2016   dialysis solution 1.5% low-MG/low-CA     promethazine (PHENERGAN) injection (IM or IVPB) 12.5 mg (08/10/22 2017)     calcium acetate  1,334 mg Oral TID WC   escitalopram  20 mg Oral Daily   furosemide  80 mg Oral BID   gentamicin cream  1 Application Topical Daily   gentamicin cream  1 Application Topical Daily   insulin aspart  0-5 Units Subcutaneous QHS   insulin aspart  0-9 Units Subcutaneous TID WC   losartan  100 mg Oral Daily   metoprolol succinate  25 mg Oral Daily   sodium chloride flush  3 mL Intravenous Q12H    Assessment/ Plan:     Principal Problem:   ESRD (end stage renal disease) on dialysis [facility]) Active Problems:   Coronary artery disease due to lipid rich plaque   Anemia due to chronic kidney disease   Anxiety and depression   Diabetic gastroparesis (HCC)   Chronic diastolic CHF (congestive heart failure) ([place])   Essential hypertension   Severe malnutrition due to type 2 diabetes mellitus ([place])   Type 2 diabetes mellitus with chronic kidney disease, with long-term current use of insulin (HCC)   Hyperkalemia   Hyperphosphatemia  41 year old white male with a history of hypertension, diabetes, gastroesophageal reflux disease, end-stage renal disease on peritoneal dialysis.  Patient was admitted due to his noncompliance with his dialysis treatment.  He would like to pursue hemodialysis treatment.   #  1: End-stage renal disease: Patient has been on peritoneal dialysis.  Catheter has been working well.  Patient did not perform his dialysis treatments though he was uremic and also has hyperkalemia.  His potassium has improved now. We will have vascular put in a permacath and start him on hemodialysis tomorrow.  Spoke to the PD nurse today   #2: Second hyperparathyroidism: Patient has hyperphosphatemia.  We will continue the calcium as sedated at the present doses.   #3: Hypertension: We will continue the metoprolol at the present doses.   #4: Diabetes: Continue insulin as ordered.   #5: Congestive heart failure: We will continue the Lasix 80 mg twice a day.   #6: Nausea: We can  start him on Zofran on as-needed basis.     LOS: [place], MD [facility] 8/13/202310:04 PM

## 2022-08-10 NOTE — Progress Notes (Signed)
PT was able to do 3 cycles out of 4. Pt had drain issues, possibly kinked while he was asleep.   Will do manual drain to make sure no dialysate fluid left in peritoneal cavity Pt due for HD access tomorrow and will do HD. MD notified.

## 2022-08-10 NOTE — Progress Notes (Signed)
Progress Note   Patient: Mark [name] PIR:[id number] DOB: [birth year] DOA: 08/07/2022     3 DOS: the patient was seen and examined on 08/10/2022       Brief hospital course: Mr. Moyers is a 41 y.o. M with ESRD on PD, DM, HTN, CAD and anemia of CKD who presented with worsening renal function and uremic symptoms.  Patient has not done his peritoneal dialysis in the last 12 days because he was frustrated with not feeling well and having to be connected nightly.           Assessment and Plan: * ESRD (end stage renal disease) on dialysis [facility]) Patient admitted with acidosis and uremia after stopping his peritoneal dialysis 2 weeks prior to admission.  With regard to the reason for stopping peritoneal dialysis, he felt "overwhelmed", and was seeking additional resources to help do his peritoneal dialysis for him at home.  TOC were consulted.  Dialysis coordinator was enlisted, but no such resources could be found.  After discussion with nephrology and palliative care, the patient elected to transition to hemodialysis.  - PD per nephrology - Plan for Vas-Cath on Monday, then initiation of hemodialysis    Hyperphosphatemia - Phos binders and PD per Nephrology  Hyperkalemia Resolved with PD - Resume ARB  Type 2 diabetes mellitus with chronic kidney disease, with long-term current use of insulin (HCC) Glucose controlled - Sliding scale corrections  Severe malnutrition due to type 2 diabetes mellitus ([place]) As eevidenced by severe loss of subcutaneous muscle mass and fat diffusely    Essential hypertension Blood pressure slightly elevated - Continue home furosemide, metoprolol - Resume ARB  Chronic diastolic CHF (congestive heart failure) (HCC) Mildly swollen, no dyspnea, no pulmonary edema - Continue home furosemide, metoprolol - Resume ARB now that K is resolved - Dialysis plans per nephrology   Diabetic gastroparesis (HCC) Mild nausea without vomiting on admission.   Despite onset after improvement in BUN, still think this is likely from uremia, possibly gastroparesis.   - Antiemetics as needed  Anxiety and depression Psychiatry consulted, they recommend increasing Lexapro, no inpatient treatment needed.   - Continue new dose Lexapro  Anemia due to chronic kidney disease Hemoglobin lower than previous baseline, no clinical bleeding, due to chronic kidney disease -Aranesp per nephrology  Coronary artery disease due to lipid rich plaque - Continue home furosemide, metoprolol           Subjective: Patient still has severe nausea.  He has no abdominal pain.  He is having some loose stools.  His swelling is better.  No confusion, fever, dyspnea.     Physical Exam: Vitals:   08/09/22 1506 08/09/22 1931 08/10/22 0516 08/10/22 0751  BP: (!) 141/97 (!) 139/93 (!) 154/103 (!) 154/101  Pulse: (!) 59 (!) 52 64 62  Resp: 16 16 16 20   Temp: 97.9 F (36.6 C) 97.6 F (36.4 C) 97.9 F (36.6 C) 98.4 F (36.9 C)  TempSrc: Oral   Oral  SpO2: 96% 96% 96% 93%  Weight:      Height:       Very thin adult male, lying in bed in fetal position, and appears disheveled, no acute distress RRR, no murmurs, no peripheral edema Respiratory rate normal, lungs clear without rales or wheezes Abdomen without guarding or tenderness Attention normal, affect blunted, appears groggy and tired Face symmetric, speech fluent  Data Reviewed: Glucose controlled Creatinine down to 14 BUN 132 from 150 yesterday Bicarb up to 20, potassium normal  Disposition: Status is: Inpatient The patient was admitted with uremia in the setting of having stopped his home dialysis.  When probed about why he stopped dialysis he states that he was "overwhelmed, and seeking additional resources.  Investigation for additional resources is fruitless, and after further discussion with palliative care and nephrology, decision was made to transition to hemodialysis.  He will  need to dialysis catheter placed on Monday, and then clipped.        Author: Edwin Dada, MD 08/10/2022 11:35 AM  For on call review [email or web address].

## 2022-08-10 NOTE — Progress Notes (Signed)
Nausea improving. Patient able to eat 1/3 of a [place] sandwich.

## 2022-08-11 ENCOUNTER — Encounter
Admission: EM | Disposition: A | Discharge status: Home / Self Care | Payer: Self-pay | Source: Home / Self Care | Attending: Family Medicine

## 2022-08-11 ENCOUNTER — Inpatient Hospital Stay: Payer: Medicare Other

## 2022-08-11 DIAGNOSIS — F419 Anxiety disorder, unspecified: Secondary | ICD-10-CM | POA: Diagnosis not present

## 2022-08-11 DIAGNOSIS — N189 Chronic kidney disease, unspecified: Secondary | ICD-10-CM | POA: Diagnosis not present

## 2022-08-11 DIAGNOSIS — I5032 Chronic diastolic (congestive) heart failure: Secondary | ICD-10-CM | POA: Diagnosis not present

## 2022-08-11 DIAGNOSIS — N186 End stage renal disease: Secondary | ICD-10-CM | POA: Diagnosis not present

## 2022-08-11 DIAGNOSIS — Z992 Dependence on renal dialysis: Secondary | ICD-10-CM | POA: Diagnosis not present

## 2022-08-11 HISTORY — PX: DIALYSIS/PERMA CATHETER INSERTION: CATH118288

## 2022-08-11 LAB — RENAL FUNCTION PANEL
Albumin: 2.6 g/dL — ABNORMAL LOW (ref 3.5–5.0)
Anion gap: 14 (ref 5–15)
BUN: 130 mg/dL — ABNORMAL HIGH (ref 6–20)
CO2: 18 mmol/L — ABNORMAL LOW (ref 22–32)
Calcium: 7 mg/dL — ABNORMAL LOW (ref 8.9–10.3)
Chloride: 109 mmol/L (ref 98–111)
Creatinine, Ser: 15.95 mg/dL — ABNORMAL HIGH (ref 0.61–1.24)
GFR, Estimated: 4 mL/min — ABNORMAL LOW (ref >60)
Glucose, Bld: 78 mg/dL (ref 70–99)
Phosphorus: >30 mg/dL — ABNORMAL HIGH (ref 2.5–4.6)
Potassium: 4.1 mmol/L (ref 3.5–5.1)
Sodium: 141 mmol/L (ref 135–145)

## 2022-08-11 LAB — CBC
HCT: 30.5 % — ABNORMAL LOW (ref 39.0–52.0)
Hemoglobin: 10.2 g/dL — ABNORMAL LOW (ref 13.0–17.0)
MCH: 29.6 pg (ref 26.0–34.0)
MCHC: 33.4 g/dL (ref 30.0–36.0)
MCV: 88.4 fL (ref 80.0–100.0)
Platelets: 169 10*3/uL (ref 150–400)
RBC: 3.45 MIL/uL — ABNORMAL LOW (ref 4.22–5.81)
RDW: 13.6 % (ref 11.5–15.5)
WBC: 3.9 10*3/uL — ABNORMAL LOW (ref 4.0–10.5)
nRBC: 0 % (ref 0.0–0.2)

## 2022-08-11 LAB — GLUCOSE, CAPILLARY
Glucose-Capillary: 72 mg/dL (ref 70–99)
Glucose-Capillary: 87 mg/dL (ref 70–99)
Glucose-Capillary: 74 mg/dL (ref 70–99)
Glucose-Capillary: 112 mg/dL — ABNORMAL HIGH (ref 70–99)
Glucose-Capillary: 76 mg/dL (ref 70–99)

## 2022-08-11 LAB — HEPATITIS B E ANTIBODY: Hep B E Ab: NEGATIVE

## 2022-08-11 SURGERY — DIALYSIS/PERMA CATHETER INSERTION
Anesthesia: Moderate Sedation

## 2022-08-11 MED ORDER — VANCOMYCIN HCL IN DEXTROSE 1-5 GM/200ML-% IV SOLN
1000.0000 mg | Freq: Once | INTRAVENOUS | Status: DC
  Filled 2022-08-11 (×2): qty 200

## 2022-08-11 MED ORDER — MIDAZOLAM HCL 2 MG/2ML IJ SOLN
INTRAMUSCULAR | Status: DC | PRN
  Administered 2022-08-11: 2 mg via INTRAVENOUS

## 2022-08-11 MED ORDER — DEXTROSE 5 % IV SOLN
INTRAVENOUS | Status: AC
Start: 2022-08-11 — End: 2022-08-11

## 2022-08-11 MED ORDER — FENTANYL CITRATE (PF) 100 MCG/2ML IJ SOLN
INTRAMUSCULAR | Status: AC
  Filled 2022-08-11: qty 2

## 2022-08-11 MED ORDER — SODIUM CHLORIDE 0.9 % IV SOLN: INTRAVENOUS | Status: DC

## 2022-08-11 MED ORDER — FENTANYL CITRATE (PF) 100 MCG/2ML IJ SOLN
INTRAMUSCULAR | Status: DC | PRN
  Administered 2022-08-11: 50 ug via INTRAVENOUS

## 2022-08-11 MED ORDER — DELFLEX-LC/1.5% DEXTROSE 344 MOSM/L IP SOLN: INTRAPERITONEAL | Status: DC

## 2022-08-11 MED ORDER — VANCOMYCIN HCL 500 MG/100ML IV SOLN
INTRAVENOUS | Status: DC | PRN
  Administered 2022-08-11: 1000 mg via INTRAVENOUS

## 2022-08-11 MED ORDER — MIDAZOLAM HCL 5 MG/5ML IJ SOLN
INTRAMUSCULAR | Status: AC
  Filled 2022-08-11: qty 5

## 2022-08-11 MED ORDER — CHLORHEXIDINE GLUCONATE CLOTH 2 % EX PADS
6.0000 | MEDICATED_PAD | Freq: Every day | CUTANEOUS | Status: DC
Start: 2022-08-12 — End: 2022-08-14
  Administered 2022-08-11 – 2022-08-12 (×2): 6 via TOPICAL

## 2022-08-11 SURGICAL SUPPLY — 8 items
BIOPATCH RED 1 DISK 7.0 (GAUZE/BANDAGES/DRESSINGS) ×1 IMPLANT
CATH CANNON HEMO 15FR 19 (HEMODIALYSIS SUPPLIES) ×1 IMPLANT
DERMABOND ADVANCED (GAUZE/BANDAGES/DRESSINGS) ×1
DERMABOND ADVANCED .7 DNX12 (GAUZE/BANDAGES/DRESSINGS) IMPLANT
PACK ANGIOGRAPHY (CUSTOM PROCEDURE TRAY) ×1 IMPLANT
SHEATH PROBE COVER 6X72 (BAG) ×1 IMPLANT
SUT MNCRL AB 4-0 PS2 18 (SUTURE) ×1 IMPLANT
SUT PROLENE 0 CT 1 30 (SUTURE) ×1 IMPLANT

## 2022-08-11 NOTE — Progress Notes (Signed)
    Patient ID: Mark [name], male   DOB: 04-23-1981, 41 y.o.   MRN: [id number]  Patient CBG this morning 72. Patient is NPO for a procedure today. MD notified.  Haydee Salter, RN

## 2022-08-11 NOTE — Progress Notes (Addendum)
[facility]  ROUNDING NOTE   Subjective:   Mark [name] is a 41 y.o. male with past medical history including hypertension, GERD, diabetes, and end-stage renal disease on peritoneal dialysis.  Patient presents to the emergency department with complaints of abdominal pain and has been admitted for ESRD on dialysis [facility]) [N18.6, Z99.2] ESRD (end stage renal disease) on dialysis ([place]) [N18.6, Z99.2]  Patient is followed by the [facility] clinic, supervised by Dr. Candiss Norse.    Patient seen resting in bed, alert and oriented No complaints offered today Currently n.p.o. for vascular procedure.   Objective:  Vital signs in last 24 hours:  Temp:  [97.2 F (36.2 C)-98 F (36.7 C)] 98 F (36.7 C) (08/14 0757) Pulse Rate:  [56-65] 64 (08/14 0757) Resp:  [16-18] 16 (08/14 0757) BP: (129-158)/(87-107) 158/107 (08/14 0757) SpO2:  [93 %-96 %] 96 % (08/14 0757) Weight:  [89.1 kg] 89.1 kg (08/14 0512)  Weight change:  Filed Weights   08/07/22 1544 08/11/22 0512  Weight: 67 kg 89.1 kg    Intake/Output: I/O last 3 completed shifts: In: 237.6 [P.O.:120; I.V.:18.6; IV Piggyback:99] Out: -    Intake/Output this shift:  Total I/O In: 305.4 [I.V.:305.4] Out: -   Physical Exam: General: NAD  Head: Normocephalic, atraumatic. Moist oral mucosal membranes  Eyes: Anicteric  Lungs:  Clear to auscultation, normal effort  Heart: Regular rate and rhythm  Abdomen:  Soft, nontender, PD catheter  Extremities:  No peripheral edema.  Neurologic: Nonfocal, moving all four extremities  Skin: No lesions  Access: Pd catheter    Basic Metabolic Panel: Recent Labs  Lab 08/07/22 1546 08/08/22 0645 08/09/22 0730 08/10/22 0440 08/11/22 0426  NA 143 141 140 140 141  K 5.3* 5.8* 5.0 3.8 4.1  CL 110 111 107 108 109  CO2 15* 13* 18* 20* 18*  GLUCOSE 74 134* 90 172* 78  BUN 106* 178* 150* 132* 130*  CREATININE 17.96* 17.57* 16.92* 14.77* 15.95*  CALCIUM 7.1* 6.8* 6.8* 6.7* 7.0*   MG  --  2.8*  --   --   --   PHOS  --  >30.0* >30.0*  --  >30.0*     Liver Function Tests: Recent Labs  Lab 08/07/22 1546 08/08/22 0645 08/09/22 0730 08/11/22 0426  AST 14* 13*  --   --   ALT 11 9  --   --   ALKPHOS 84 76  --   --   BILITOT 0.8 0.8  --   --   PROT 6.1* 5.4*  --   --   ALBUMIN 2.9* 2.6* 2.6* 2.6*    Recent Labs  Lab 08/07/22 1546  LIPASE 33    No results for input(s): "AMMONIA" in the last 168 hours.  CBC: Recent Labs  Lab 08/07/22 1546 08/08/22 0645 08/10/22 0440 08/11/22 0426  WBC 3.5* 3.5* 3.2* 3.9*  HGB 10.1* 9.4* 9.2* 10.2*  HCT 31.1* 28.5* 27.3* 30.5*  MCV 90.4 89.3 88.3 88.4  PLT 178 160 147* 169     Cardiac Enzymes: No results for input(s): "CKTOTAL", "CKMB", "CKMBINDEX", "TROPONINI" in the last 168 hours.  BNP: Invalid input(s): "POCBNP"  CBG: Recent Labs  Lab 08/10/22 1159 08/10/22 1625 08/10/22 2149 08/11/22 0754 08/11/22 1129  GLUCAP 96 78 95 72 87     Microbiology: Results for orders placed or performed during the hospital encounter of 08/06/16  Urine culture     Status: Abnormal   Collection Time: 08/06/16 11:50 AM   Specimen: Urine,  Clean Catch  Result Value Ref Range Status   Specimen Description URINE, CLEAN CATCH  Final   Special Requests NONE  Final   Culture 40,000 COLONIES/mL STAPHYLOCOCCUS AUREUS (A)  Final   Report Status 08/08/2016 FINAL  Final   Organism ID, Bacteria STAPHYLOCOCCUS AUREUS (A)  Final      Susceptibility   Staphylococcus aureus - MIC*    CIPROFLOXACIN <=0.5 SENSITIVE Sensitive     GENTAMICIN <=0.5 SENSITIVE Sensitive     NITROFURANTOIN <=16 SENSITIVE Sensitive     OXACILLIN 0.5 SENSITIVE Sensitive     TETRACYCLINE <=1 SENSITIVE Sensitive     VANCOMYCIN <=0.5 SENSITIVE Sensitive     TRIMETH/SULFA <=10 SENSITIVE Sensitive     CLINDAMYCIN <=0.25 SENSITIVE Sensitive     RIFAMPIN <=0.5 SENSITIVE Sensitive     Inducible Clindamycin NEGATIVE Sensitive     * 40,000 COLONIES/mL  STAPHYLOCOCCUS AUREUS    Coagulation Studies: No results for input(s): "LABPROT", "INR" in the last 72 hours.  Urinalysis: No results for input(s): "COLORURINE", "LABSPEC", "PHURINE", "GLUCOSEU", "HGBUR", "BILIRUBINUR", "KETONESUR", "PROTEINUR", "UROBILINOGEN", "NITRITE", "LEUKOCYTESUR" in the last 72 hours.  Invalid input(s): "APPERANCEUR"     Imaging: DG Chest 1 View  Result Date: 08/11/2022 CLINICAL DATA:  End stage renal disease EXAM: CHEST  1 VIEW COMPARISON:  Radiograph 07/08/2016 FINDINGS: Interval increase in size of cardiac silhouette compared to 2017. Bilateral pleural effusions are present. No pulmonary edema. No pneumothorax. IMPRESSION: Cardiomegaly and bilateral effusions suggest congestive heart failure. Cannot exclude pericardial effusion. Electronically Signed   By: Suzy Bouchard M.D.   On: 08/11/2022 15:00     Medications:    sodium chloride Stopped (08/11/22 0905)   sodium chloride     promethazine (PHENERGAN) injection (IM or IVPB) 12.5 mg (08/11/22 0600)   vancomycin      calcium acetate  1,334 mg Oral TID WC   escitalopram  20 mg Oral Daily   furosemide  80 mg Oral BID   insulin aspart  0-5 Units Subcutaneous QHS   insulin aspart  0-9 Units Subcutaneous TID WC   losartan  100 mg Oral Daily   metoprolol succinate  25 mg Oral Daily   sodium chloride flush  3 mL Intravenous Q12H   sodium chloride, acetaminophen **OR** acetaminophen, bisacodyl, hydrALAZINE, HYDROcodone-acetaminophen, metoCLOPramide (REGLAN) injection, morphine injection, promethazine (PHENERGAN) injection (IM or IVPB), senna-docusate, zolpidem  Assessment/ Plan:  Mr. Mark [name] is a 41 y.o.  male with past medical history including hypertension, GERD, diabetes, and end-stage renal disease on peritoneal dialysis.  Patient presents to the emergency department with complaints of abdominal pain and has been admitted for ESRD on dialysis ([place]) [N18.6, Z99.2] ESRD (end stage renal disease)  on dialysis ([place]) [N18.6, Z99.2]  [facility] HD  End stage renal disease with hyperkalemia transitioning to hemodialysis.  Currently n.p.o. awaiting PermCath placement by vascular surgery.  Will initiate hemodialysis tomorrow.  Renal navigator currently seeking outpatient placement. Chest X-ray ordered for outpatient placement and hepatitis B labs ordered per hospital protocol.   2. Secondary Hyperparathyroidism:  Lab Results  Component Value Date   CALCIUM 7.0 (L) 08/11/2022   CAION 1.07 (L) 05/23/2022   PHOS >30.0 (H) 08/11/2022    Calcium slowly improving and phosphorus remains elevated. We will continue calcium acetate binders with meals and manage with dialysis treatments.  3. Anemia of chronic kidney disease/ Lab Results  Component Value Date   HGB 10.2 (L) 08/11/2022    Hemoglobin at goal.  4. Hypertension with chronic kidney disease.  Home regimen includes furosemide, losartan, and metoprolol. All currently held. Blood pressure elevated 158/107   LOS: 4 Shantelle Breeze 8/14/20233:14 PM   I saw and evaluated the patient with Colon Flattery, NP.  I personally formulated the plan of care.  I agree with the findings and plan as documented in the note except as noted below.  Patient now transitioning to hemodialysis..  Awaiting PermCath placement.  Next dialysis treatment tomorrow.  Gelena Klosinski Holley Raring , [facility] 8/14/20235:21 PM

## 2022-08-11 NOTE — Care Management Important Message (Signed)
Important Message  Patient Details  Name: Mark [name] MRN: [id number] Date of Birth: Oct 10, 1981   Medicare Important Message Given:  Yes     Dannette Barbara 08/11/2022, 11:24 AM

## 2022-08-11 NOTE — Op Note (Signed)
OPERATIVE NOTE    PRE-OPERATIVE DIAGNOSIS: 1. ESRD   POST-OPERATIVE DIAGNOSIS: same as above  PROCEDURE: Ultrasound guidance for vascular access to the right internal jugular vein Fluoroscopic guidance for placement of catheter Placement of a 19 cm tip to cuff tunneled hemodialysis catheter via the right internal jugular vein  SURGEON: Mark Pain, MD  ANESTHESIA:  Local with Moderate conscious sedation for approximately 17 minutes using 2 mg of Versed and 50 mcg of Fentanyl  ESTIMATED BLOOD LOSS: 5 cc  FLUORO TIME: less than one minute  CONTRAST: none  FINDING(S): 1.  Patent right internal jugular vein  SPECIMEN(S):  None  INDICATIONS:   Mark [name] is a 41 y.o.male who presents with renal failure and .  The patient needs long term dialysis access for their ESRD, and a Permcath is necessary.  Risks and benefits are discussed and informed consent is obtained.    DESCRIPTION: After obtaining full informed written consent, the patient was brought back to the vascular suited. The patient's right neck and chest were sterilely prepped and draped in a sterile surgical field was created. Moderate conscious sedation was administered during a face to face encounter with the patient throughout the procedure with my supervision of the RN administering medicines and monitoring the patient's vital signs, pulse oximetry, telemetry and mental status throughout from the start of the procedure until the patient was taken to the recovery room.  The right internal jugular vein was visualized with ultrasound and found to be patent. It was then accessed under direct ultrasound guidance and a permanent image was recorded. A wire was placed. After skin nick and dilatation, the peel-away sheath was placed over the wire. I then turned my attention to an area under the clavicle. Approximately 1-2 fingerbreadths below the clavicle a small counterincision was created and tunneled from the subclavicular  incision to the access site. Using fluoroscopic guidance, a 19 centimeter tip to cuff tunneled hemodialysis catheter was selected, and tunneled from the subclavicular incision to the access site. It was then placed through the peel-away sheath and the peel-away sheath was removed. Using fluoroscopic guidance the catheter tips were parked in the right atrium. The appropriate distal connectors were placed. It withdrew blood well and flushed easily with heparinized saline and a concentrated heparin solution was then placed. It was secured to the chest wall with 2 Prolene sutures. The access incision was closed single 4-0 Monocryl. A 4-0 Monocryl pursestring suture was placed around the exit site. Sterile dressings were placed. The patient tolerated the procedure well and was taken to the recovery room in stable condition.  COMPLICATIONS: None  CONDITION: Stable  Mark Pain, MD 08/11/2022 4:16 PM   This note was created with Dragon Medical transcription system. Any errors in dictation are purely unintentional.

## 2022-08-11 NOTE — Progress Notes (Signed)
Progress Note   Patient: Mark [name] XKG:[id number] DOB: Oct 20, 1981 DOA: 08/07/2022     4 DOS: the patient was seen and examined on 08/11/2022 at 11:37 AM      Brief hospital course: Mr. Ekholm is a 41 y.o. M with ESRD on PD, DM, HTN, CAD and anemia of CKD who presented with worsening renal function and uremic symptoms.  Patient has not done his peritoneal dialysis in the last 12 days because he was frustrated with not feeling well and having to be connected nightly.           Assessment and Plan: * ESRD (end stage renal disease) on dialysis [facility]) Patient admitted with acidosis and uremia after stopping his peritoneal dialysis 2 weeks prior to admission.  With regard to the reason for stopping peritoneal dialysis, he felt "overwhelmed", and was seeking additional resources to help do his peritoneal dialysis for him at home.  TOC were consulted.  Dialysis coordinator was enlisted, but no such resources could be found.  After discussion with nephrology and palliative care, the patient elected to transition to hemodialysis.  - PD per nephrology - Plan for Vas-Cath today, then initiation of hemodialysis    Hyperphosphatemia - Phos binders and PD per Nephrology  Hyperkalemia Resolved with PD - Resume ARB  Type 2 diabetes mellitus with chronic kidney disease, with long-term current use of insulin (HCC) Glucose low this morning - Start small dextrose infusion until he is able to take p.o.   Severe malnutrition due to type 2 diabetes mellitus ([place]) As eevidenced by severe loss of subcutaneous muscle mass and fat diffusely    Essential hypertension Blood pressure slightly elevated - Continue home furosemide, metoprolol, losartan  Chronic diastolic CHF (congestive heart failure) (HCC) Mildly swollen, no dyspnea, no pulmonary edema - Continue home furosemide, metoprolol, losartan  - Dialysis plans per nephrology   Diabetic gastroparesis ([place]) Mild nausea without vomiting  on admission.  Despite onset after improvement in BUN, still think this is likely from uremia, possibly gastroparesis.   Slightly better today - Antiemetics as needed  Anxiety and depression Psychiatry consulted, they recommend increasing Lexapro, no inpatient treatment needed.   - Continue new dose Lexapro  Anemia due to chronic kidney disease Hemoglobin lower than previous baseline, no clinical bleeding, due to chronic kidney disease -Aranesp per nephrology  Coronary artery disease due to lipid rich plaque - Continue home furosemide, metoprolol           Subjective: Patient is nausea slightly better, he still overall has low appetite, no fever, confusion, swelling is improved.  No significant headache, chest pain, abdominal pain.,  Overall appears weak and tired.     Physical Exam: Vitals:   08/10/22 1624 08/11/22 0503 08/11/22 0512 08/11/22 0757  BP: (!) 139/94 129/87  (!) 158/107  Pulse: (!) 56 65  64  Resp: 18 17  16   Temp: (!) 97.2 F (36.2 C) 98 F (36.7 C)  98 F (36.7 C)  TempSrc: Oral   Oral  SpO2: 94% 93%  96%  Weight:   89.1 kg   Height:       Very thin adult male, lying in bed in fetal position, arouses sluggishly, interactive and appropriate RRR, no murmurs, no peripheral edema Respiratory rate normal, lungs clear without rales or wheezes Abdomen soft without tenderness palpation, no guarding, no ascites Attention normal, affect blunted and sluggish, sleepy, judgment and insight appear normal, face symmetric, right eye deformity, unchanged from baseline, speech fluent, generalized weakness.  Data Reviewed: Hemogram shows mild anemia, no change from previous, white blood cell count 3.9, no change Basic metabolic panel shows creatinine up to 15.9, BUN 130, no change, bicarb down to 18, calcium 7, phosphorus elevated      Disposition: Status is: Inpatient Patient was admitted with uremia in the setting of stopping his home dialysis at home.  When  probed about why he stopped dialysis his description was that he was "overwhelmed "and seeking additional resources.  TOC investigated additional resources, but were unsuccessful and after further discussion with palliative care and nephrology, the decision was made to transition to hemodialysis.  He will have a dialysis catheter placed today, then initiate hemodialysis and need to be clipped        Author: Edwin Dada, MD 08/11/2022 1:51 PM  For on call review [email or web address].
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
## 2022-08-11 NOTE — TOC Progression Note (Signed)
Transition of Care [facility]) - Progression Note    Patient Details  Name: Mark [name] MRN: [id number] Date of Birth: 03/10/1981  [facility] CM/SW Contact  Beverly Sessions, RN Phone Number: 08/11/2022, 2:42 PM  Clinical Narrative:        Plan for permacath placement tomorrow and initiation of outpatient HD.  Elvera Bicker HD coordinator notified     Expected Discharge Plan and Services                                                 Social Determinants of Health (SDOH) Interventions    Readmission Risk Interventions     No data to display

## 2022-08-11 NOTE — Progress Notes (Signed)
Mobility Specialist - Progress Note    08/11/22 1434  Mobility  Activity Refused mobility   Pt refuses mobility at this time, no reason specified. Will attempt at another date and time.  Gretchen Short  Mobility Specialist  08/11/22 2:36 PM

## 2022-08-12 ENCOUNTER — Encounter: Payer: Self-pay | Admitting: Vascular Surgery

## 2022-08-12 DIAGNOSIS — N186 End stage renal disease: Secondary | ICD-10-CM | POA: Diagnosis not present

## 2022-08-12 DIAGNOSIS — Z992 Dependence on renal dialysis: Secondary | ICD-10-CM | POA: Diagnosis not present

## 2022-08-12 DIAGNOSIS — Z7189 Other specified counseling: Secondary | ICD-10-CM | POA: Diagnosis not present

## 2022-08-12 LAB — BASIC METABOLIC PANEL
Anion gap: 15 (ref 5–15)
BUN: 132 mg/dL — ABNORMAL HIGH (ref 6–20)
CO2: 18 mmol/L — ABNORMAL LOW (ref 22–32)
Calcium: 7 mg/dL — ABNORMAL LOW (ref 8.9–10.3)
Chloride: 108 mmol/L (ref 98–111)
Creatinine, Ser: 16.12 mg/dL — ABNORMAL HIGH (ref 0.61–1.24)
GFR, Estimated: 3 mL/min — ABNORMAL LOW (ref >60)
Glucose, Bld: 70 mg/dL (ref 70–99)
Potassium: 4.1 mmol/L (ref 3.5–5.1)
Sodium: 141 mmol/L (ref 135–145)

## 2022-08-12 LAB — CBC
HCT: 30.6 % — ABNORMAL LOW (ref 39.0–52.0)
Hemoglobin: 10.3 g/dL — ABNORMAL LOW (ref 13.0–17.0)
MCH: 29.7 pg (ref 26.0–34.0)
MCHC: 33.7 g/dL (ref 30.0–36.0)
MCV: 88.2 fL (ref 80.0–100.0)
Platelets: 152 10*3/uL (ref 150–400)
RBC: 3.47 MIL/uL — ABNORMAL LOW (ref 4.22–5.81)
RDW: 13.4 % (ref 11.5–15.5)
WBC: 3.8 10*3/uL — ABNORMAL LOW (ref 4.0–10.5)
nRBC: 0 % (ref 0.0–0.2)

## 2022-08-12 LAB — GLUCOSE, CAPILLARY
Glucose-Capillary: 74 mg/dL (ref 70–99)
Glucose-Capillary: 63 mg/dL — ABNORMAL LOW (ref 70–99)
Glucose-Capillary: 75 mg/dL (ref 70–99)
Glucose-Capillary: 95 mg/dL (ref 70–99)

## 2022-08-12 MED ORDER — HEPARIN SODIUM (PORCINE) 1000 UNIT/ML IJ SOLN
INTRAMUSCULAR | Status: AC
  Administered 2022-08-12: 1000 [IU]
  Filled 2022-08-12: qty 10

## 2022-08-12 MED ORDER — PENTAFLUOROPROP-TETRAFLUOROETH EX AERO: 1.0000 | INHALATION_SPRAY | CUTANEOUS | Status: DC | PRN

## 2022-08-12 MED ORDER — LIDOCAINE-PRILOCAINE 2.5-2.5 % EX CREA: 1.0000 | TOPICAL_CREAM | CUTANEOUS | Status: DC | PRN

## 2022-08-12 MED ORDER — LIDOCAINE HCL (PF) 1 % IJ SOLN
5.0000 mL | INTRAMUSCULAR | Status: DC | PRN
  Filled 2022-08-12: qty 5

## 2022-08-12 MED ORDER — HYDRALAZINE HCL 20 MG/ML IJ SOLN
10.0000 mg | INTRAMUSCULAR | Status: DC | PRN
  Administered 2022-08-12 – 2022-08-14 (×2): 10 mg via INTRAVENOUS
  Filled 2022-08-12 (×2): qty 1

## 2022-08-12 MED ORDER — ANTICOAGULANT SODIUM CITRATE 4% (200MG/5ML) IV SOLN: 5.0000 mL | Status: DC | PRN

## 2022-08-12 MED ORDER — ALTEPLASE 2 MG IJ SOLR
2.0000 mg | Freq: Once | INTRAMUSCULAR | Status: AC | PRN
  Administered 2022-08-12: 2 mg
  Filled 2022-08-12: qty 2

## 2022-08-12 MED ORDER — ALTEPLASE 2 MG IJ SOLR
INTRAMUSCULAR | Status: AC
  Filled 2022-08-12: qty 2

## 2022-08-12 MED ORDER — HEPARIN SODIUM (PORCINE) 1000 UNIT/ML DIALYSIS: 1000.0000 [IU] | INTRAMUSCULAR | Status: DC | PRN

## 2022-08-12 MED ORDER — GLUCOSE 40 % PO GEL
1.0000 | ORAL | Status: AC
  Administered 2022-08-12: 31 g via ORAL
  Filled 2022-08-12: qty 1.21

## 2022-08-12 NOTE — Care Management (Signed)
Underwent successful hemodialysis treatment inpatient on 8/15.  Tolerated well however pressures remain elevated.  Did not receive his blood pressure medication today.  Evaluated patient after HD.  Somewhat lethargic with blunted affect otherwise asymptomatic.  Will keep patient here overnight and plan on discharge 8/16  Notified by outpatient HD coordinator Elvera Bicker that patient has a scheduled HD chair time on Thursday 8/17 at 10:45 AM.  We will plan to discharge 8/16.  Ralene Muskrat MD

## 2022-08-12 NOTE — Progress Notes (Signed)
Pt catheter BFR decreased to 250 d/t high AP and VP alarms. Gwyneth Revels, NP and Gita Kudo, MD notified. Pausing treatment for cathflo dwell. Pt no c/o, vss, safety maintained.

## 2022-08-12 NOTE — Progress Notes (Signed)
In process of switching patient's modality from PD to HD. Goal is to have patient set up in-center at [facility] for hemodialysis on Thursday 8/17.

## 2022-08-12 NOTE — Progress Notes (Signed)
Pt HD complete w/ no complications. Report to primary RN. Pt alert, no c/o, vss. Catheter dressing changed. Treatment paused for cathflo dwell.  Start 0920 End 1408 0 fluid removed 52.7L BVP 62.4kg post standing weight

## 2022-08-12 NOTE — Progress Notes (Signed)
Pre HD RN assessment 
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
## 2022-08-12 NOTE — Progress Notes (Signed)
Tx restarted after cathflo. Maintaining desired pressures. Pt alert, no c/o, Breeze, NP present and aware. Safety maintained.

## 2022-08-12 NOTE — Progress Notes (Signed)
Post HD RN assessment 

## 2022-08-12 NOTE — Progress Notes (Signed)
PROGRESS NOTE    Mark [name]  JKK:[id number] DOB: 07-28-1981 DOA: 08/07/2022 PCP: Pcp, No    Brief Narrative:  Mr. Mark [name] is a 41 y.o. M with ESRD on PD, DM, HTN, CAD and anemia of CKD who presented with worsening renal function and uremic symptoms.   Patient has not done his peritoneal dialysis in the last 12 days because he was frustrated with not feeling well and having to be connected nightly.    Vascular surgery engaged on this admission.  Underwent placement of dialysis access of the right internal jugular vein with tunneled hemodialysis catheter  HD on 8/15   Assessment & Plan:   Principal Problem:   ESRD (end stage renal disease) on dialysis [facility]) Active Problems:   Coronary artery disease due to lipid rich plaque   Anemia due to chronic kidney disease   Anxiety and depression   Diabetic gastroparesis (HCC)   Chronic diastolic CHF (congestive heart failure) ([place])   Essential hypertension   Severe malnutrition due to type 2 diabetes mellitus ([place])   Type 2 diabetes mellitus with chronic kidney disease, with long-term current use of insulin (HCC)   Hyperkalemia   Hyperphosphatemia  * ESRD (end stage renal disease) on dialysis [facility]) Patient admitted with acidosis and uremia after stopping his peritoneal dialysis 2 weeks prior to admission.  With regard to the reason for stopping peritoneal dialysis, he felt "overwhelmed", and was seeking additional resources to help do his peritoneal dialysis for him at home.   TOC were consulted.  Dialysis coordinator was enlisted, but no such resources could be found.  After discussion with nephrology and palliative care, the patient elected to transition to hemodialysis.  Vascular surgery placed permacath in the right IJ on 8/14 Plan: HD today per nephrology    Hyperphosphatemia - Phos binders and PD per Nephrology   Hyperkalemia Resolved with PD - Resumed ARB   Type 2 diabetes mellitus with chronic kidney disease, with  long-term current use of insulin (HCC) Sensitive sliding scale   Severe malnutrition due to type 2 diabetes mellitus ([place]) As eevidenced by severe loss of subcutaneous muscle mass and fat diffusely     Essential hypertension Blood pressure slightly elevated - Continue home furosemide, metoprolol, losartan HD for fluid removal   Chronic diastolic CHF (congestive heart failure) (HCC) Mildly swollen, no dyspnea, no pulmonary edema - Continue home furosemide, metoprolol, losartan  - Hemodialysis plans per nephrology    Diabetic gastroparesis ([place]) Mild nausea without vomiting on admission.  Despite onset after improvement in BUN, still think this is likely from uremia, possibly gastroparesis.   Slightly better today - Antiemetics as needed   Anxiety and depression Psychiatry consulted, they recommend increasing Lexapro, no inpatient treatment needed.   - Continue new dose Lexapro   Anemia due to chronic kidney disease Hemoglobin lower than previous baseline, no clinical bleeding, due to chronic kidney disease -Aranesp per nephrology   Coronary artery disease due to lipid rich plaque - Continue home furosemide, metoprolol   DVT prophylaxis: Subcu heparin Code Status: Partial Family Communication: None Disposition Plan: Status is: Inpatient Remains inpatient appropriate because: New hemodialysis start.  Will need outpatient HD chair prior to discharge   Level of care: Med-Surg  Consultants:  Nephrology Palliative care  Procedures:  Dialysis access in right IJ 8/14  Antimicrobials: None   Subjective: Seen and examined.  Resting comfortably during HD  Objective: Vitals:   08/12/22 1205 08/12/22 1235 08/12/22 1300 08/12/22 1330  BP: (!) 166/107 Marland Kitchen)  147/105 (!) 163/107 (!) 145/103  Pulse:   66   Resp:   (!) 9 11  Temp:      TempSrc:      SpO2:   97%   Weight:      Height:        Intake/Output Summary (Last 24 hours) at 08/12/2022 1351 Last data filed at  08/11/2022 1500 Gross per 24 hour  Intake 305.38 ml  Output --  Net 305.38 ml   Filed Weights   08/07/22 1544 08/11/22 0512  Weight: 67 kg 89.1 kg    Examination:  General exam: NAD.  Appears frail Respiratory system: Lungs clear.  Normal work of breathing.  Room air Cardiovascular system: S1-S2, RRR, no murmurs, no pedal edema Gastrointestinal system: Thin, soft, NT/ND, normal bowel sounds Central nervous system: Alert and oriented. No focal neurological deficits. Extremities: Diffuse muscle wasting.  Decreased power Skin: No rashes, lesions or ulcers Psychiatry: Judgement and insight appear impaired. Mood & affect blunted.     Data Reviewed: I have personally reviewed following labs and imaging studies  CBC: Recent Labs  Lab 08/07/22 1546 08/08/22 0645 08/10/22 0440 08/11/22 0426 08/12/22 0438  WBC 3.5* 3.5* 3.2* 3.9* 3.8*  HGB 10.1* 9.4* 9.2* 10.2* 10.3*  HCT 31.1* 28.5* 27.3* 30.5* 30.6*  MCV 90.4 89.3 88.3 88.4 88.2  PLT 178 160 147* 169 322   Basic Metabolic Panel: Recent Labs  Lab 08/08/22 0645 08/09/22 0730 08/10/22 0440 08/11/22 0426 08/12/22 0438  NA 141 140 140 141 141  K 5.8* 5.0 3.8 4.1 4.1  CL 111 107 108 109 108  CO2 13* 18* 20* 18* 18*  GLUCOSE 134* 90 172* 78 70  BUN 178* 150* 132* 130* 132*  CREATININE 17.57* 16.92* 14.77* 15.95* 16.12*  CALCIUM 6.8* 6.8* 6.7* 7.0* 7.0*  MG 2.8*  --   --   --   --   PHOS >30.0* >30.0*  --  >30.0*  --    GFR: Estimated Creatinine Clearance: 6.9 mL/min (A) (by C-G formula based on SCr of 16.12 mg/dL (H)). Liver Function Tests: Recent Labs  Lab 08/07/22 1546 08/08/22 0645 08/09/22 0730 08/11/22 0426  AST 14* 13*  --   --   ALT 11 9  --   --   ALKPHOS 84 76  --   --   BILITOT 0.8 0.8  --   --   PROT 6.1* 5.4*  --   --   ALBUMIN 2.9* 2.6* 2.6* 2.6*   Recent Labs  Lab 08/07/22 1546  LIPASE 33   No results for input(s): "AMMONIA" in the last 168 hours. Coagulation Profile: No results for  input(s): "INR", "PROTIME" in the last 168 hours. Cardiac Enzymes: No results for input(s): "CKTOTAL", "CKMB", "CKMBINDEX", "TROPONINI" in the last 168 hours. BNP (last 3 results) No results for input(s): "PROBNP" in the last 8760 hours. HbA1C: No results for input(s): "HGBA1C" in the last 72 hours. CBG: Recent Labs  Lab 08/11/22 1129 08/11/22 1546 08/11/22 1648 08/11/22 2123 08/12/22 0821  GLUCAP 87 74 112* 76 74   Lipid Profile: No results for input(s): "CHOL", "HDL", "LDLCALC", "TRIG", "CHOLHDL", "LDLDIRECT" in the last 72 hours. Thyroid Function Tests: No results for input(s): "TSH", "T4TOTAL", "FREET4", "T3FREE", "THYROIDAB" in the last 72 hours. Anemia Panel: No results for input(s): "VITAMINB12", "FOLATE", "FERRITIN", "TIBC", "IRON", "RETICCTPCT" in the last 72 hours. Sepsis Labs: No results for input(s): "PROCALCITON", "LATICACIDVEN" in the last 168 hours.  No results found for this or any  previous visit (from the past 240 hour(s)).       Radiology Studies: PERIPHERAL VASCULAR CATHETERIZATION  Result Date: 08/11/2022 See surgical note for result.  DG Chest 1 View  Result Date: 08/11/2022 CLINICAL DATA:  End stage renal disease EXAM: CHEST  1 VIEW COMPARISON:  Radiograph 07/08/2016 FINDINGS: Interval increase in size of cardiac silhouette compared to 2017. Bilateral pleural effusions are present. No pulmonary edema. No pneumothorax. IMPRESSION: Cardiomegaly and bilateral effusions suggest congestive heart failure. Cannot exclude pericardial effusion. Electronically Signed   By: Suzy Bouchard M.D.   On: 08/11/2022 15:00        Scheduled Meds:  alteplase       calcium acetate  1,334 mg Oral TID WC   Chlorhexidine Gluconate Cloth  6 each Topical Q0600   escitalopram  20 mg Oral Daily   furosemide  80 mg Oral BID   insulin aspart  0-5 Units Subcutaneous QHS   insulin aspart  0-9 Units Subcutaneous TID WC   losartan  100 mg Oral Daily   metoprolol succinate   25 mg Oral Daily   sodium chloride flush  3 mL Intravenous Q12H   Continuous Infusions:  sodium chloride Stopped (08/11/22 0905)   anticoagulant sodium citrate     promethazine (PHENERGAN) injection (IM or IVPB) 12.5 mg (08/11/22 0600)     LOS: 5 days   \\    Sidney Ace, MD [facility]   If 7PM-7AM, please contact night-coverage  08/12/2022, 1:51 PM

## 2022-08-12 NOTE — Progress Notes (Signed)
Post HD vitals 

## 2022-08-12 NOTE — Progress Notes (Signed)
Pt catheter lines reversed d/t high ap and vp. Pt alert, no c/o, stable. Sherlyn Hay, NP present and aware. Safety maintained.

## 2022-08-12 NOTE — Progress Notes (Signed)
HD paused at 1105a for catflo dwell d/t high pressures and poorly functioning HD catheter. Pt resting, vss, safety maintained. Gwyneth Revels, NP aware.

## 2022-08-12 NOTE — Progress Notes (Signed)
PLACEMENT RESOLVED: [facility] TTS 10:45am, start 8/17 at 10:30am.

## 2022-08-12 NOTE — Progress Notes (Signed)
[facility]  ROUNDING NOTE   Subjective:   Mark [name] is a 41 y.o. male with past medical history including hypertension, GERD, diabetes, and end-stage renal disease on peritoneal dialysis.  Patient presents to the emergency department with complaints of abdominal pain and has been admitted for ESRD on dialysis [facility]) [N18.6, Z99.2] ESRD (end stage renal disease) on dialysis ([place]) [N18.6, Z99.2]  Patient is followed by the [facility] clinic, supervised by Dr. Candiss Norse.    Patient seen and evaluated during dialysis   HEMODIALYSIS FLOWSHEET:  Blood Flow Rate (mL/min): 300 mL/min Arterial Pressure (mmHg): -150 mmHg Venous Pressure (mmHg): 110 mmHg TMP (mmHg): 4 mmHg Ultrafiltration Rate (mL/min): 312 mL/min Dialysate Flow Rate (mL/min): 300 ml/min  No complaints at this time Resting comfortably    Objective:  Vital signs in last 24 hours:  Temp:  [97.5 F (36.4 C)-97.9 F (36.6 C)] 97.9 F (36.6 C) (08/15 1408) Pulse Rate:  [57-70] 66 (08/15 1300) Resp:  [5-26] 13 (08/15 1411) BP: (130-168)/(79-115) 163/109 (08/15 1411) SpO2:  [93 %-99 %] 97 % (08/15 1300) Weight:  [62.3 kg-62.4 kg] 62.4 kg (08/15 1411)  Weight change:  Filed Weights   08/11/22 0512 08/12/22 0915 08/12/22 1411  Weight: 89.1 kg 62.3 kg 62.4 kg    Intake/Output: I/O last 3 completed shifts: In: 374 [I.V.:324; IV Piggyback:50] Out: -    Intake/Output this shift:  No intake/output data recorded.  Physical Exam: General: NAD  Head: Normocephalic, atraumatic. Moist oral mucosal membranes  Eyes: Anicteric  Lungs:  Clear to auscultation, normal effort  Heart: Regular rate and rhythm  Abdomen:  Soft, nontender, PD catheter  Extremities:  No peripheral edema.  Neurologic: Nonfocal, moving all four extremities  Skin: No lesions  Access: Pd catheter, Rt permcath    Basic Metabolic Panel: Recent Labs  Lab 08/08/22 0645 08/09/22 0730 08/10/22 0440 08/11/22 0426 08/12/22 0438   NA 141 140 140 141 141  K 5.8* 5.0 3.8 4.1 4.1  CL 111 107 108 109 108  CO2 13* 18* 20* 18* 18*  GLUCOSE 134* 90 172* 78 70  BUN 178* 150* 132* 130* 132*  CREATININE 17.57* 16.92* 14.77* 15.95* 16.12*  CALCIUM 6.8* 6.8* 6.7* 7.0* 7.0*  MG 2.8*  --   --   --   --   PHOS >30.0* >30.0*  --  >30.0*  --      Liver Function Tests: Recent Labs  Lab 08/07/22 1546 08/08/22 0645 08/09/22 0730 08/11/22 0426  AST 14* 13*  --   --   ALT 11 9  --   --   ALKPHOS 84 76  --   --   BILITOT 0.8 0.8  --   --   PROT 6.1* 5.4*  --   --   ALBUMIN 2.9* 2.6* 2.6* 2.6*    Recent Labs  Lab 08/07/22 1546  LIPASE 33    No results for input(s): "AMMONIA" in the last 168 hours.  CBC: Recent Labs  Lab 08/07/22 1546 08/08/22 0645 08/10/22 0440 08/11/22 0426 08/12/22 0438  WBC 3.5* 3.5* 3.2* 3.9* 3.8*  HGB 10.1* 9.4* 9.2* 10.2* 10.3*  HCT 31.1* 28.5* 27.3* 30.5* 30.6*  MCV 90.4 89.3 88.3 88.4 88.2  PLT 178 160 147* 169 152     Cardiac Enzymes: No results for input(s): "CKTOTAL", "CKMB", "CKMBINDEX", "TROPONINI" in the last 168 hours.  BNP: Invalid input(s): "POCBNP"  CBG: Recent Labs  Lab 08/11/22 1129 08/11/22 1546 08/11/22 1648 08/11/22 2123 08/12/22 1740  GLUCAP 87 74 112* 76 72     Microbiology: Results for orders placed or performed during the hospital encounter of 08/06/16  Urine culture     Status: Abnormal   Collection Time: 08/06/16 11:50 AM   Specimen: Urine, Clean Catch  Result Value Ref Range Status   Specimen Description URINE, CLEAN CATCH  Final   Special Requests NONE  Final   Culture 40,000 COLONIES/mL STAPHYLOCOCCUS AUREUS (A)  Final   Report Status 08/08/2016 FINAL  Final   Organism ID, Bacteria STAPHYLOCOCCUS AUREUS (A)  Final      Susceptibility   Staphylococcus aureus - MIC*    CIPROFLOXACIN <=0.5 SENSITIVE Sensitive     GENTAMICIN <=0.5 SENSITIVE Sensitive     NITROFURANTOIN <=16 SENSITIVE Sensitive     OXACILLIN 0.5 SENSITIVE Sensitive      TETRACYCLINE <=1 SENSITIVE Sensitive     VANCOMYCIN <=0.5 SENSITIVE Sensitive     TRIMETH/SULFA <=10 SENSITIVE Sensitive     CLINDAMYCIN <=0.25 SENSITIVE Sensitive     RIFAMPIN <=0.5 SENSITIVE Sensitive     Inducible Clindamycin NEGATIVE Sensitive     * 40,000 COLONIES/mL STAPHYLOCOCCUS AUREUS    Coagulation Studies: No results for input(s): "LABPROT", "INR" in the last 72 hours.  Urinalysis: No results for input(s): "COLORURINE", "LABSPEC", "PHURINE", "GLUCOSEU", "HGBUR", "BILIRUBINUR", "KETONESUR", "PROTEINUR", "UROBILINOGEN", "NITRITE", "LEUKOCYTESUR" in the last 72 hours.  Invalid input(s): "APPERANCEUR"     Imaging: PERIPHERAL VASCULAR CATHETERIZATION  Result Date: 08/11/2022 See surgical note for result.  DG Chest 1 View  Result Date: 08/11/2022 CLINICAL DATA:  End stage renal disease EXAM: CHEST  1 VIEW COMPARISON:  Radiograph 07/08/2016 FINDINGS: Interval increase in size of cardiac silhouette compared to 2017. Bilateral pleural effusions are present. No pulmonary edema. No pneumothorax. IMPRESSION: Cardiomegaly and bilateral effusions suggest congestive heart failure. Cannot exclude pericardial effusion. Electronically Signed   By: Suzy Bouchard M.D.   On: 08/11/2022 15:00     Medications:    sodium chloride Stopped (08/11/22 0905)   anticoagulant sodium citrate     promethazine (PHENERGAN) injection (IM or IVPB) 12.5 mg (08/11/22 0600)    alteplase       calcium acetate  1,334 mg Oral TID WC   Chlorhexidine Gluconate Cloth  6 each Topical Q0600   escitalopram  20 mg Oral Daily   furosemide  80 mg Oral BID   insulin aspart  0-5 Units Subcutaneous QHS   insulin aspart  0-9 Units Subcutaneous TID WC   losartan  100 mg Oral Daily   metoprolol succinate  25 mg Oral Daily   sodium chloride flush  3 mL Intravenous Q12H   sodium chloride, acetaminophen **OR** acetaminophen, alteplase, anticoagulant sodium citrate, bisacodyl, heparin, hydrALAZINE,  HYDROcodone-acetaminophen, lidocaine (PF), lidocaine-prilocaine, metoCLOPramide (REGLAN) injection, morphine injection, pentafluoroprop-tetrafluoroeth, promethazine (PHENERGAN) injection (IM or IVPB), senna-docusate, zolpidem  Assessment/ Plan:  Mr. Mark [name] is a 41 y.o.  male with past medical history including hypertension, GERD, diabetes, and end-stage renal disease on peritoneal dialysis.  Patient presents to the emergency department with complaints of abdominal pain and has been admitted for ESRD on dialysis ([place]) [N18.6, Z99.2] ESRD (end stage renal disease) on dialysis ([place]) [N18.6, Z99.2]  [facility] HD  End stage renal disease with hyperkalemia transitioning to hemodialysis.  Appreciate vascular surgery placing rt permcath. Patient receiving dialysis today, tolerating treatment well. Appreciate renal navigator for confirming outpatient clinic at [facility] on a TTS schedule. Patient can start on Thursday. Cleared to discharge from renal stance.  2. Secondary  Hyperparathyroidism:  Lab Results  Component Value Date   CALCIUM 7.0 (L) 08/12/2022   CAION 1.07 (L) 05/23/2022   PHOS >30.0 (H) 08/11/2022    Calcium slowly improving. We will continue calcium acetate binders with meals and manage with dialysis treatments.  3. Anemia of chronic kidney disease Lab Results  Component Value Date   HGB 10.3 (L) 08/12/2022    Hemoglobin remains within acceptable range.  4. Hypertension with chronic kidney disease. Home regimen includes furosemide, losartan, and metoprolol. All currently held. Blood pressure elevated during dialysis 163/109. More stable between treatments.    LOS: 5 Mark [name] 8/15/20232:29 PM

## 2022-08-12 NOTE — TOC Progression Note (Signed)
Transition of Care [facility]) - Progression Note    Patient Details  Name: Mark [name] MRN: [id number] Date of Birth: 09/22/1981  [facility] CM/SW Contact  Beverly Sessions, RN Phone Number: 08/12/2022, 3:53 PM  Clinical Narrative:      Patient states that his mother will transport at discharge Patient states that he has already called and arranged [facility] transport for HD on Thursday.  No further TOC needs identified.  Please consult if indicated       Expected Discharge Plan and Services                                                 Social Determinants of Health (SDOH) Interventions    Readmission Risk Interventions     No data to display

## 2022-08-13 ENCOUNTER — Inpatient Hospital Stay: Payer: Medicare Other

## 2022-08-13 DIAGNOSIS — N186 End stage renal disease: Secondary | ICD-10-CM | POA: Diagnosis not present

## 2022-08-13 DIAGNOSIS — Z7189 Other specified counseling: Secondary | ICD-10-CM | POA: Diagnosis not present

## 2022-08-13 DIAGNOSIS — Z992 Dependence on renal dialysis: Secondary | ICD-10-CM | POA: Diagnosis not present

## 2022-08-13 LAB — GLUCOSE, CAPILLARY
Glucose-Capillary: 71 mg/dL (ref 70–99)
Glucose-Capillary: 76 mg/dL (ref 70–99)
Glucose-Capillary: 72 mg/dL (ref 70–99)
Glucose-Capillary: 69 mg/dL — ABNORMAL LOW (ref 70–99)
Glucose-Capillary: 82 mg/dL (ref 70–99)

## 2022-08-13 LAB — BASIC METABOLIC PANEL
Anion gap: 10 (ref 5–15)
BUN: 72 mg/dL — ABNORMAL HIGH (ref 6–20)
CO2: 25 mmol/L (ref 22–32)
Calcium: 7.3 mg/dL — ABNORMAL LOW (ref 8.9–10.3)
Chloride: 105 mmol/L (ref 98–111)
Creatinine, Ser: 10.37 mg/dL — ABNORMAL HIGH (ref 0.61–1.24)
GFR, Estimated: 6 mL/min — ABNORMAL LOW (ref >60)
Glucose, Bld: 72 mg/dL (ref 70–99)
Potassium: 3.5 mmol/L (ref 3.5–5.1)
Sodium: 140 mmol/L (ref 135–145)

## 2022-08-13 MED ORDER — METOCLOPRAMIDE HCL 5 MG PO TABS
5.0000 mg | ORAL_TABLET | Freq: Three times a day (TID) | ORAL | Status: DC
  Administered 2022-08-13 – 2022-08-14 (×3): 5 mg via ORAL
  Filled 2022-08-13 (×4): qty 1

## 2022-08-13 MED ORDER — METOCLOPRAMIDE HCL 10 MG PO TABS
10.0000 mg | ORAL_TABLET | Freq: Three times a day (TID) | ORAL | Status: DC
  Administered 2022-08-13: 10 mg via ORAL
  Filled 2022-08-13: qty 1

## 2022-08-13 MED ORDER — HYDRALAZINE HCL 25 MG PO TABS
25.0000 mg | ORAL_TABLET | Freq: Two times a day (BID) | ORAL | Status: DC
  Administered 2022-08-13 – 2022-08-14 (×3): 25 mg via ORAL
  Filled 2022-08-13 (×3): qty 1

## 2022-08-13 MED ORDER — HEPARIN SODIUM (PORCINE) 5000 UNIT/ML IJ SOLN
5000.0000 [IU] | Freq: Three times a day (TID) | INTRAMUSCULAR | Status: DC
  Administered 2022-08-13 (×2): 5000 [IU] via SUBCUTANEOUS
  Filled 2022-08-13 (×3): qty 1

## 2022-08-13 NOTE — Progress Notes (Signed)
[facility]  ROUNDING NOTE   Subjective:   Mark [name] is a 41 y.o. male with past medical history including hypertension, GERD, diabetes, and end-stage renal disease on peritoneal dialysis.  Patient presents to the emergency department with complaints of abdominal pain and has been admitted for ESRD on dialysis [facility]) [N18.6, Z99.2] ESRD (end stage renal disease) on dialysis ([place]) [N18.6, Z99.2]  Patient is followed by the [facility] clinic, supervised by Dr. Candiss Norse.    Patient seen resting in bed Alert and oriented Complains of nausea and vomiting Patient states this has been occurring intermittently for over a year, does not follow with GI outpatient. States he continues to feel bad   Objective:  Vital signs in last 24 hours:  Temp:  [97.9 F (36.6 C)-98.4 F (36.9 C)] 98 F (36.7 C) (08/16 0918) Pulse Rate:  [66-72] 69 (08/16 0918) Resp:  [11-18] 18 (08/16 0918) BP: (140-168)/(85-115) 144/85 (08/16 1241) SpO2:  [94 %-96 %] 96 % (08/16 0918) Weight:  [62.4 kg] 62.4 kg (08/15 1411)  Weight change:  Filed Weights   08/11/22 0512 08/12/22 0915 08/12/22 1411  Weight: 89.1 kg 62.3 kg 62.4 kg    Intake/Output: I/O last 3 completed shifts: In: -  Out: 200 [Emesis/NG output:200]   Intake/Output this shift:  Total I/O In: 340 [P.O.:240; IV Piggyback:100] Out: -   Physical Exam: General: NAD  Head: Normocephalic, atraumatic. Moist oral mucosal membranes  Eyes: Anicteric  Lungs:  Clear to auscultation, normal effort  Heart: Regular rate and rhythm  Abdomen:  Soft, nontender, PD catheter  Extremities:  No peripheral edema.  Neurologic: Nonfocal, moving all four extremities  Skin: No lesions  Access: Pd catheter, Rt permcath    Basic Metabolic Panel: Recent Labs  Lab 08/08/22 0645 08/09/22 0730 08/10/22 0440 08/11/22 0426 08/12/22 0438 08/13/22 0446  NA 141 140 140 141 141 140  K 5.8* 5.0 3.8 4.1 4.1 3.5  CL 111 107 108 109 108 105  CO2  13* 18* 20* 18* 18* 25  GLUCOSE 134* 90 172* 78 70 72  BUN 178* 150* 132* 130* 132* 72*  CREATININE 17.57* 16.92* 14.77* 15.95* 16.12* 10.37*  CALCIUM 6.8* 6.8* 6.7* 7.0* 7.0* 7.3*  MG 2.8*  --   --   --   --   --   PHOS >30.0* >30.0*  --  >30.0*  --   --      Liver Function Tests: Recent Labs  Lab 08/07/22 1546 08/08/22 0645 08/09/22 0730 08/11/22 0426  AST 14* 13*  --   --   ALT 11 9  --   --   ALKPHOS 84 76  --   --   BILITOT 0.8 0.8  --   --   PROT 6.1* 5.4*  --   --   ALBUMIN 2.9* 2.6* 2.6* 2.6*    Recent Labs  Lab 08/07/22 1546  LIPASE 33    No results for input(s): "AMMONIA" in the last 168 hours.  CBC: Recent Labs  Lab 08/07/22 1546 08/08/22 0645 08/10/22 0440 08/11/22 0426 08/12/22 0438  WBC 3.5* 3.5* 3.2* 3.9* 3.8*  HGB 10.1* 9.4* 9.2* 10.2* 10.3*  HCT 31.1* 28.5* 27.3* 30.5* 30.6*  MCV 90.4 89.3 88.3 88.4 88.2  PLT 178 160 147* 169 152     Cardiac Enzymes: No results for input(s): "CKTOTAL", "CKMB", "CKMBINDEX", "TROPONINI" in the last 168 hours.  BNP: Invalid input(s): "POCBNP"  CBG: Recent Labs  Lab 08/12/22 1610 08/12/22 1628 08/12/22 2111  08/13/22 0916 08/13/22 1126  GLUCAP 63* 75 95 71 76     Microbiology: Results for orders placed or performed during the hospital encounter of 08/06/16  Urine culture     Status: Abnormal   Collection Time: 08/06/16 11:50 AM   Specimen: Urine, Clean Catch  Result Value Ref Range Status   Specimen Description URINE, CLEAN CATCH  Final   Special Requests NONE  Final   Culture 40,000 COLONIES/mL STAPHYLOCOCCUS AUREUS (A)  Final   Report Status 08/08/2016 FINAL  Final   Organism ID, Bacteria STAPHYLOCOCCUS AUREUS (A)  Final      Susceptibility   Staphylococcus aureus - MIC*    CIPROFLOXACIN <=0.5 SENSITIVE Sensitive     GENTAMICIN <=0.5 SENSITIVE Sensitive     NITROFURANTOIN <=16 SENSITIVE Sensitive     OXACILLIN 0.5 SENSITIVE Sensitive     TETRACYCLINE <=1 SENSITIVE Sensitive      VANCOMYCIN <=0.5 SENSITIVE Sensitive     TRIMETH/SULFA <=10 SENSITIVE Sensitive     CLINDAMYCIN <=0.25 SENSITIVE Sensitive     RIFAMPIN <=0.5 SENSITIVE Sensitive     Inducible Clindamycin NEGATIVE Sensitive     * 40,000 COLONIES/mL STAPHYLOCOCCUS AUREUS    Coagulation Studies: No results for input(s): "LABPROT", "INR" in the last 72 hours.  Urinalysis: No results for input(s): "COLORURINE", "LABSPEC", "PHURINE", "GLUCOSEU", "HGBUR", "BILIRUBINUR", "KETONESUR", "PROTEINUR", "UROBILINOGEN", "NITRITE", "LEUKOCYTESUR" in the last 72 hours.  Invalid input(s): "APPERANCEUR"     Imaging: DG Abd 1 View  Result Date: 08/13/2022 CLINICAL DATA:  41 year old male with abdominal pain. EXAM: ABDOMEN - 1 VIEW COMPARISON:  None Available. FINDINGS: The bowel gas pattern is normal. Left lower quadrant peritoneal dialysis catheter in place with the coiled portion in the left hemipelvis. No radiographic evidence of kinking or catheter fracture. No radio-opaque calculi or other significant radiographic abnormality are seen. IMPRESSION: Left abdominal peritoneal dialysis catheter in place without complicating features. Electronically Signed   By: Ruthann Cancer M.D.   On: 08/13/2022 09:06   PERIPHERAL VASCULAR CATHETERIZATION  Result Date: 08/11/2022 See surgical note for result.  DG Chest 1 View  Result Date: 08/11/2022 CLINICAL DATA:  End stage renal disease EXAM: CHEST  1 VIEW COMPARISON:  Radiograph 07/08/2016 FINDINGS: Interval increase in size of cardiac silhouette compared to 2017. Bilateral pleural effusions are present. No pulmonary edema. No pneumothorax. IMPRESSION: Cardiomegaly and bilateral effusions suggest congestive heart failure. Cannot exclude pericardial effusion. Electronically Signed   By: Suzy Bouchard M.D.   On: 08/11/2022 15:00     Medications:    sodium chloride Stopped (08/11/22 0905)   promethazine (PHENERGAN) injection (IM or IVPB) 12.5 mg (08/13/22 1254)    calcium  acetate  1,334 mg Oral TID WC   Chlorhexidine Gluconate Cloth  6 each Topical Q0600   escitalopram  20 mg Oral Daily   furosemide  80 mg Oral BID   heparin injection (subcutaneous)  5,000 Units Subcutaneous Q8H   hydrALAZINE  25 mg Oral BID   insulin aspart  0-5 Units Subcutaneous QHS   insulin aspart  0-9 Units Subcutaneous TID WC   losartan  100 mg Oral Daily   metoCLOPramide  5 mg Oral TID AC   metoprolol succinate  25 mg Oral Daily   sodium chloride flush  3 mL Intravenous Q12H   sodium chloride, acetaminophen **OR** acetaminophen, bisacodyl, hydrALAZINE, HYDROcodone-acetaminophen, morphine injection, promethazine (PHENERGAN) injection (IM or IVPB), senna-docusate, zolpidem  Assessment/ Plan:  Mr. Mark [name] is a 41 y.o.  male with past medical history  including hypertension, GERD, diabetes, and end-stage renal disease on peritoneal dialysis.  Patient presents to the emergency department with complaints of abdominal pain and has been admitted for ESRD on dialysis ([place]) [N18.6, Z99.2] ESRD (end stage renal disease) on dialysis ([place]) [N18.6, Z99.2]  [facility] HD-TTS  End stage renal disease with hyperkalemia transitioning to hemodialysis.  Appreciate vascular surgery placing rt permcath.  Patient has been accepted at outpatient clinic, [facility], on a TTS schedule.  Will defer to primary team for discharge planning.  2. Secondary Hyperparathyroidism:  Lab Results  Component Value Date   CALCIUM 7.3 (L) 08/13/2022   CAION 1.07 (L) 05/23/2022   PHOS >30.0 (H) 08/11/2022    Calcium continues to slowly improve.  We will continue calcium acetate binders with meals.  Phosphorus will improve with dialysis and binders..  3. Anemia of chronic kidney disease Lab Results  Component Value Date   HGB 10.3 (L) 08/12/2022    Hemoglobin at goal  4. Hypertension with chronic kidney disease. Home regimen includes furosemide, losartan, and metoprolol. All currently held. Blood pressure  144/85, improved.  5.  Nausea, vomiting, unknown etiology.  Primary team to consult GI.   LOS: 6 Vasiliy Mccarry 8/16/20231:10 PM
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
## 2022-08-13 NOTE — Progress Notes (Signed)
PROGRESS NOTE    FIELD STANISZEWSKI  ZOX:[id number] DOB: 08-08-1981 DOA: 08/07/2022 PCP: Pcp, No    Brief Narrative:  Mr. Nest is a 41 y.o. M with ESRD on PD, DM, HTN, CAD and anemia of CKD who presented with worsening renal function and uremic symptoms.   Patient has not done his peritoneal dialysis in the last 12 days because he was frustrated with not feeling well and having to be connected nightly.    Vascular surgery engaged on this admission.  Underwent placement of dialysis access of the right internal jugular vein with tunneled hemodialysis catheter  HD on 8/15 8/16 vomited this AM.  BGI on low side due to patient not eating today.  Assessment & Plan:   Principal Problem:   ESRD (end stage renal disease) on dialysis [facility]) Active Problems:   Coronary artery disease due to lipid rich plaque   Anemia due to chronic kidney disease   Anxiety and depression   Diabetic gastroparesis (HCC)   Chronic diastolic CHF (congestive heart failure) (HCC)   Essential hypertension   Severe malnutrition due to type 2 diabetes mellitus ([place])   Type 2 diabetes mellitus with chronic kidney disease, with long-term current use of insulin (HCC)   Hyperkalemia   Hyperphosphatemia  * ESRD (end stage renal disease) on dialysis [facility]) Vascular surgery placed permacath in the right IJ on 8/14 Started on HD here, next tx Thursday (setup for outpt if can be discharged). HD TTS schedule    Hyperphosphatemia Continue Phos binders     Hyperkalemia Resolved with PD Mom to during dialysis Continue ARB's   Type 2 diabetes mellitus with chronic kidney disease, with long-term current use of insulin (HCC) On sliding scale BG on low side due to patient vomiting and not eating Continue to monitor   Severe malnutrition due to type 2 diabetes mellitus ([place]) As eevidenced by severe loss of subcutaneous muscle mass and fat diffusely     Essential hypertension Blood pressure slightly elevated -  Continue home furosemide, metoprolol, losartan HD for fluid removal   Chronic diastolic CHF (congestive heart failure) (HCC) Mildly swollen, no dyspnea, no pulmonary edema - Continue home furosemide, metoprolol, losartan  - Hemodialysis plans per nephrology    Diabetic gastroparesis ([place]) Changed to reglan standing dose, HD dose.  Gi consulted, pt had previous w/u and was dx of gastroparesis. Did not follow with GI [facility] nor follows the strict gastroparesis diet. Repeat egd not indicated F/u with [facility] after dc Gi signed off   Anxiety and depression Psychiatry consulted, they recommend increasing Lexapro, no inpatient treatment needed.   - Continue new dose Lexapro   Anemia due to chronic kidney disease Hemoglobin lower than previous baseline, no clinical bleeding, due to chronic kidney disease -Aranesp per nephrology   Coronary artery disease due to lipid rich plaque - Continue home furosemide, metoprolol   DVT prophylaxis: Subcu heparin Code Status: Partial Family Communication: None Disposition Plan: Status is: Inpatient Remains inpatient appropriate because: New hemodialysis start.  Will need outpatient HD chair prior to discharge. Vomiting today, no po intake. Low bg   Level of care: Med-Surg  Consultants:  Nephrology Palliative care  Procedures:  Dialysis access in right IJ 8/14  Antimicrobials: None   Subjective: +vomiting. Xray obtained, negaitve. No abd pain  Objective: Vitals:   08/13/22 0505 08/13/22 0506 08/13/22 0918 08/13/22 1241  BP: (!) 150/100 (!) 154/91 (!) 168/103 (!) 144/85  Pulse: 69 68 69   Resp: 15  18  Temp: 98.2 F (36.8 C)  98 F (36.7 C)   TempSrc: Oral  Oral   SpO2: 95%  96%   Weight:      Height:        Intake/Output Summary (Last 24 hours) at 08/13/2022 1402 Last data filed at 08/13/2022 1129 Gross per 24 hour  Intake 340 ml  Output 200 ml  Net 140 ml   Filed Weights   08/11/22 0512 08/12/22 0915 08/12/22 1411   Weight: 89.1 kg 62.3 kg 62.4 kg    Examination:  Calm, NAD Cta no w/r Reg s1/s2 no gallop Soft benign +bs No edema Aaoxox3  Mood and affect appropriate in current setting     Data Reviewed: I have personally reviewed following labs and imaging studies  CBC: Recent Labs  Lab 08/07/22 1546 08/08/22 0645 08/10/22 0440 08/11/22 0426 08/12/22 0438  WBC 3.5* 3.5* 3.2* 3.9* 3.8*  HGB 10.1* 9.4* 9.2* 10.2* 10.3*  HCT 31.1* 28.5* 27.3* 30.5* 30.6*  MCV 90.4 89.3 88.3 88.4 88.2  PLT 178 160 147* 169 027   Basic Metabolic Panel: Recent Labs  Lab 08/08/22 0645 08/09/22 0730 08/10/22 0440 08/11/22 0426 08/12/22 0438 08/13/22 0446  NA 141 140 140 141 141 140  K 5.8* 5.0 3.8 4.1 4.1 3.5  CL 111 107 108 109 108 105  CO2 13* 18* 20* 18* 18* 25  GLUCOSE 134* 90 172* 78 70 72  BUN 178* 150* 132* 130* 132* 72*  CREATININE 17.57* 16.92* 14.77* 15.95* 16.12* 10.37*  CALCIUM 6.8* 6.8* 6.7* 7.0* 7.0* 7.3*  MG 2.8*  --   --   --   --   --   PHOS >30.0* >30.0*  --  >30.0*  --   --    GFR: Estimated Creatinine Clearance: 8.4 mL/min (A) (by C-G formula based on SCr of 10.37 mg/dL (H)). Liver Function Tests: Recent Labs  Lab 08/07/22 1546 08/08/22 0645 08/09/22 0730 08/11/22 0426  AST 14* 13*  --   --   ALT 11 9  --   --   ALKPHOS 84 76  --   --   BILITOT 0.8 0.8  --   --   PROT 6.1* 5.4*  --   --   ALBUMIN 2.9* 2.6* 2.6* 2.6*   Recent Labs  Lab 08/07/22 1546  LIPASE 33   No results for input(s): "AMMONIA" in the last 168 hours. Coagulation Profile: No results for input(s): "INR", "PROTIME" in the last 168 hours. Cardiac Enzymes: No results for input(s): "CKTOTAL", "CKMB", "CKMBINDEX", "TROPONINI" in the last 168 hours. BNP (last 3 results) No results for input(s): "PROBNP" in the last 8760 hours. HbA1C: No results for input(s): "HGBA1C" in the last 72 hours. CBG: Recent Labs  Lab 08/12/22 1610 08/12/22 1628 08/12/22 2111 08/13/22 0916 08/13/22 1126   GLUCAP 63* 75 95 71 76   Lipid Profile: No results for input(s): "CHOL", "HDL", "LDLCALC", "TRIG", "CHOLHDL", "LDLDIRECT" in the last 72 hours. Thyroid Function Tests: No results for input(s): "TSH", "T4TOTAL", "FREET4", "T3FREE", "THYROIDAB" in the last 72 hours. Anemia Panel: No results for input(s): "VITAMINB12", "FOLATE", "FERRITIN", "TIBC", "IRON", "RETICCTPCT" in the last 72 hours. Sepsis Labs: No results for input(s): "PROCALCITON", "LATICACIDVEN" in the last 168 hours.  No results found for this or any previous visit (from the past 240 hour(s)).       Radiology Studies: DG Abd 1 View  Result Date: 08/13/2022 CLINICAL DATA:  41 year old male with abdominal pain. EXAM: ABDOMEN - 1 VIEW COMPARISON:  None Available. FINDINGS: The bowel gas pattern is normal. Left lower quadrant peritoneal dialysis catheter in place with the coiled portion in the left hemipelvis. No radiographic evidence of kinking or catheter fracture. No radio-opaque calculi or other significant radiographic abnormality are seen. IMPRESSION: Left abdominal peritoneal dialysis catheter in place without complicating features. Electronically Signed   By: Ruthann Cancer M.D.   On: 08/13/2022 09:06   PERIPHERAL VASCULAR CATHETERIZATION  Result Date: 08/11/2022 See surgical note for result.  DG Chest 1 View  Result Date: 08/11/2022 CLINICAL DATA:  End stage renal disease EXAM: CHEST  1 VIEW COMPARISON:  Radiograph 07/08/2016 FINDINGS: Interval increase in size of cardiac silhouette compared to 2017. Bilateral pleural effusions are present. No pulmonary edema. No pneumothorax. IMPRESSION: Cardiomegaly and bilateral effusions suggest congestive heart failure. Cannot exclude pericardial effusion. Electronically Signed   By: Suzy Bouchard M.D.   On: 08/11/2022 15:00        Scheduled Meds:  calcium acetate  1,334 mg Oral TID WC   Chlorhexidine Gluconate Cloth  6 each Topical Q0600   escitalopram  20 mg Oral Daily    furosemide  80 mg Oral BID   heparin injection (subcutaneous)  5,000 Units Subcutaneous Q8H   hydrALAZINE  25 mg Oral BID   insulin aspart  0-5 Units Subcutaneous QHS   insulin aspart  0-9 Units Subcutaneous TID WC   losartan  100 mg Oral Daily   metoCLOPramide  5 mg Oral TID AC   metoprolol succinate  25 mg Oral Daily   sodium chloride flush  3 mL Intravenous Q12H   Continuous Infusions:  sodium chloride Stopped (08/11/22 0905)   promethazine (PHENERGAN) injection (IM or IVPB) 12.5 mg (08/13/22 1254)     LOS: 6 days   Time spent 35 minutes    Nolberto Hanlon, MD [facility]   If 7PM-7AM, please contact night-coverage  08/13/2022, 2:02 PM
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
## 2022-08-13 NOTE — TOC Progression Note (Signed)
Transition of Care [facility]) - Progression Note    Patient Details  Name: Mark [name] MRN: [id number] Date of Birth: 01-Nov-1981  Transition of Care [facility]) CM/SW Contact  Beverly Sessions, RN Phone Number: 08/13/2022, 3:07 PM  Clinical Narrative:     Per Md patient will not be discharged today        Expected Discharge Plan and Services           Expected Discharge Date: 08/13/22                                     Social Determinants of Health (SDOH) Interventions    Readmission Risk Interventions     No data to display

## 2022-08-13 NOTE — Progress Notes (Signed)
Hypoglycemic Event  CBG: 69   Treatment: 4 oz juice/soda  Symptoms: None  Follow-up CBG: Time:2204 CBG Result:82  Possible Reasons for Event: Vomiting and Inadequate meal intake  Comments/MD notified:N/A    Mark [name]

## 2022-08-14 DIAGNOSIS — Z992 Dependence on renal dialysis: Secondary | ICD-10-CM | POA: Diagnosis not present

## 2022-08-14 DIAGNOSIS — N186 End stage renal disease: Secondary | ICD-10-CM | POA: Diagnosis not present

## 2022-08-14 LAB — BASIC METABOLIC PANEL
Anion gap: 13 (ref 5–15)
BUN: 78 mg/dL — ABNORMAL HIGH (ref 6–20)
CO2: 25 mmol/L (ref 22–32)
Calcium: 7.9 mg/dL — ABNORMAL LOW (ref 8.9–10.3)
Chloride: 103 mmol/L (ref 98–111)
Creatinine, Ser: 11.33 mg/dL — ABNORMAL HIGH (ref 0.61–1.24)
GFR, Estimated: 5 mL/min — ABNORMAL LOW (ref >60)
Glucose, Bld: 84 mg/dL (ref 70–99)
Potassium: 3.7 mmol/L (ref 3.5–5.1)
Sodium: 141 mmol/L (ref 135–145)

## 2022-08-14 LAB — CBC
HCT: 34.4 % — ABNORMAL LOW (ref 39.0–52.0)
Hemoglobin: 11.5 g/dL — ABNORMAL LOW (ref 13.0–17.0)
MCH: 29.3 pg (ref 26.0–34.0)
MCHC: 33.4 g/dL (ref 30.0–36.0)
MCV: 87.8 fL (ref 80.0–100.0)
Platelets: 170 10*3/uL (ref 150–400)
RBC: 3.92 MIL/uL — ABNORMAL LOW (ref 4.22–5.81)
RDW: 13.3 % (ref 11.5–15.5)
WBC: 3.6 10*3/uL — ABNORMAL LOW (ref 4.0–10.5)
nRBC: 0 % (ref 0.0–0.2)

## 2022-08-14 LAB — GLUCOSE, CAPILLARY
Glucose-Capillary: 80 mg/dL (ref 70–99)
Glucose-Capillary: 91 mg/dL (ref 70–99)
Glucose-Capillary: 72 mg/dL (ref 70–99)

## 2022-08-14 LAB — PHOSPHORUS: Phosphorus: 8.9 mg/dL — ABNORMAL HIGH (ref 2.5–4.6)

## 2022-08-14 MED ORDER — ESCITALOPRAM OXALATE 20 MG PO TABS: 20.0000 mg | ORAL_TABLET | Freq: Every day | ORAL | 0 refills | Status: DC

## 2022-08-14 MED ORDER — CALCIUM ACETATE (PHOS BINDER) 667 MG PO CAPS
1334.0000 mg | ORAL_CAPSULE | Freq: Three times a day (TID) | ORAL | 0 refills | Status: AC
Start: 2022-08-14 — End: 2022-09-13

## 2022-08-14 MED ORDER — HYDRALAZINE HCL 25 MG PO TABS: 25.0000 mg | ORAL_TABLET | Freq: Two times a day (BID) | ORAL | 0 refills | Status: DC

## 2022-08-14 MED ORDER — HYDRALAZINE HCL 25 MG PO TABS: 25.0000 mg | ORAL_TABLET | Freq: Three times a day (TID) | ORAL | 0 refills | Status: DC

## 2022-08-14 MED ORDER — HEPARIN SODIUM (PORCINE) 1000 UNIT/ML IJ SOLN
INTRAMUSCULAR | Status: AC
  Filled 2022-08-14: qty 10

## 2022-08-14 MED ORDER — METOCLOPRAMIDE HCL 5 MG PO TABS: 5.0000 mg | ORAL_TABLET | Freq: Three times a day (TID) | ORAL | 0 refills | Status: DC

## 2022-08-14 MED ORDER — ORAL CARE MOUTH RINSE
15.0000 mL | OROMUCOSAL | Status: DC | PRN
Start: 2022-08-14 — End: 2022-08-14

## 2022-08-14 MED ORDER — HYDRALAZINE HCL 25 MG PO TABS
25.0000 mg | ORAL_TABLET | Freq: Three times a day (TID) | ORAL | Status: DC
  Filled 2022-08-14: qty 1

## 2022-08-14 NOTE — Progress Notes (Signed)
Patient A&O with flat affect. Patient is having bleeding from right IJ site. Gauze dressing applied. He also mentions that he had a very small episode of emesis this morning but currently doesn't need anything for it. Will continue to monitor.

## 2022-08-14 NOTE — Progress Notes (Signed)
Discharge instructions reviewed with pt. IV was taken out. Pt is tolerating PO intake. BP is better after IV hydralazine. Pt stated his ride wont be here between 4-5 pm.

## 2022-08-14 NOTE — Progress Notes (Signed)
[facility]  ROUNDING NOTE   Subjective:   Mark [name] is a 41 y.o. male with past medical history including hypertension, GERD, diabetes, and end-stage renal disease on peritoneal dialysis.  Patient presents to the emergency department with complaints of abdominal pain and has been admitted for ESRD on dialysis [facility]) [N18.6, Z99.2] ESRD (end stage renal disease) on dialysis ([place]) [N18.6, Z99.2]  Patient is followed by the [facility] clinic, supervised by Dr. Candiss Norse.    Patient seen and evaluated during dialysis   HEMODIALYSIS FLOWSHEET:  Blood Flow Rate (mL/min): 400 mL/min Arterial Pressure (mmHg): -180 mmHg Venous Pressure (mmHg): 160 mmHg TMP (mmHg): -8 mmHg Ultrafiltration Rate (mL/min): 569 mL/min Dialysate Flow Rate (mL/min): 300 ml/min  No complaints at this time  Objective:  Vital signs in last 24 hours:  Temp:  [97.7 F (36.5 C)-98.6 F (37 C)] 98 F (36.7 C) (08/17 1246) Pulse Rate:  [59-75] 75 (08/17 1246) Resp:  [9-19] 16 (08/17 1246) BP: (149-180)/(90-116) 163/105 (08/17 1310) SpO2:  [95 %-99 %] 98 % (08/17 1246) Weight:  [58.7 kg-60.4 kg] 58.7 kg (08/17 1214)  Weight change:  Filed Weights   08/14/22 0800 08/14/22 1211 08/14/22 1214  Weight: 60.4 kg 58.7 kg 58.7 kg    Intake/Output: I/O last 3 completed shifts: In: 340 [P.O.:240; IV Piggyback:100] Out: 200 [Emesis/NG output:200]   Intake/Output this shift:  Total I/O In: -  Out: 1000 [Other:1000]  Physical Exam: General: NAD  Head: Normocephalic, atraumatic. Moist oral mucosal membranes  Eyes: Anicteric  Lungs:  Clear to auscultation, normal effort  Heart: Regular rate and rhythm  Abdomen:  Soft, nontender, PD catheter  Extremities:  No peripheral edema.  Neurologic: Nonfocal, moving all four extremities  Skin: No lesions  Access: Pd catheter, Rt permcath    Basic Metabolic Panel: Recent Labs  Lab 08/08/22 0645 08/09/22 0730 08/10/22 0440 08/11/22 0426  08/12/22 0438 08/13/22 0446 08/14/22 0451  NA 141 140 140 141 141 140 141  K 5.8* 5.0 3.8 4.1 4.1 3.5 3.7  CL 111 107 108 109 108 105 103  CO2 13* 18* 20* 18* 18* 25 25  GLUCOSE 134* 90 172* 78 70 72 84  BUN 178* 150* 132* 130* 132* 72* 78*  CREATININE 17.57* 16.92* 14.77* 15.95* 16.12* 10.37* 11.33*  CALCIUM 6.8* 6.8* 6.7* 7.0* 7.0* 7.3* 7.9*  MG 2.8*  --   --   --   --   --   --   PHOS >30.0* >30.0*  --  >30.0*  --   --  8.9*    Liver Function Tests: Recent Labs  Lab 08/07/22 1546 08/08/22 0645 08/09/22 0730 08/11/22 0426  AST 14* 13*  --   --   ALT 11 9  --   --   ALKPHOS 84 76  --   --   BILITOT 0.8 0.8  --   --   PROT 6.1* 5.4*  --   --   ALBUMIN 2.9* 2.6* 2.6* 2.6*   Recent Labs  Lab 08/07/22 1546  LIPASE 33   No results for input(s): "AMMONIA" in the last 168 hours.  CBC: Recent Labs  Lab 08/08/22 0645 08/10/22 0440 08/11/22 0426 08/12/22 0438 08/14/22 0451  WBC 3.5* 3.2* 3.9* 3.8* 3.6*  HGB 9.4* 9.2* 10.2* 10.3* 11.5*  HCT 28.5* 27.3* 30.5* 30.6* 34.4*  MCV 89.3 88.3 88.4 88.2 87.8  PLT 160 147* 169 152 170    Cardiac Enzymes: No results for input(s): "CKTOTAL", "CKMB", "CKMBINDEX", "TROPONINI"  in the last 168 hours.  BNP: Invalid input(s): "POCBNP"  CBG: Recent Labs  Lab 08/13/22 1712 08/13/22 2134 08/13/22 2204 08/14/22 0746 08/14/22 1241  GLUCAP 72 69* 82 80 91    Microbiology: Results for orders placed or performed during the hospital encounter of 08/06/16  Urine culture     Status: Abnormal   Collection Time: 08/06/16 11:50 AM   Specimen: Urine, Clean Catch  Result Value Ref Range Status   Specimen Description URINE, CLEAN CATCH  Final   Special Requests NONE  Final   Culture 40,000 COLONIES/mL STAPHYLOCOCCUS AUREUS (A)  Final   Report Status 08/08/2016 FINAL  Final   Organism ID, Bacteria STAPHYLOCOCCUS AUREUS (A)  Final      Susceptibility   Staphylococcus aureus - MIC*    CIPROFLOXACIN <=0.5 SENSITIVE Sensitive      GENTAMICIN <=0.5 SENSITIVE Sensitive     NITROFURANTOIN <=16 SENSITIVE Sensitive     OXACILLIN 0.5 SENSITIVE Sensitive     TETRACYCLINE <=1 SENSITIVE Sensitive     VANCOMYCIN <=0.5 SENSITIVE Sensitive     TRIMETH/SULFA <=10 SENSITIVE Sensitive     CLINDAMYCIN <=0.25 SENSITIVE Sensitive     RIFAMPIN <=0.5 SENSITIVE Sensitive     Inducible Clindamycin NEGATIVE Sensitive     * 40,000 COLONIES/mL STAPHYLOCOCCUS AUREUS    Coagulation Studies: No results for input(s): "LABPROT", "INR" in the last 72 hours.  Urinalysis: No results for input(s): "COLORURINE", "LABSPEC", "PHURINE", "GLUCOSEU", "HGBUR", "BILIRUBINUR", "KETONESUR", "PROTEINUR", "UROBILINOGEN", "NITRITE", "LEUKOCYTESUR" in the last 72 hours.  Invalid input(s): "APPERANCEUR"     Imaging: DG Abd 1 View  Result Date: 08/13/2022 CLINICAL DATA:  41 year old male with abdominal pain. EXAM: ABDOMEN - 1 VIEW COMPARISON:  None Available. FINDINGS: The bowel gas pattern is normal. Left lower quadrant peritoneal dialysis catheter in place with the coiled portion in the left hemipelvis. No radiographic evidence of kinking or catheter fracture. No radio-opaque calculi or other significant radiographic abnormality are seen. IMPRESSION: Left abdominal peritoneal dialysis catheter in place without complicating features. Electronically Signed   By: Ruthann Cancer M.D.   On: 08/13/2022 09:06     Medications:    sodium chloride Stopped (08/11/22 0905)   promethazine (PHENERGAN) injection (IM or IVPB) 12.5 mg (08/13/22 1254)    calcium acetate  1,334 mg Oral TID WC   Chlorhexidine Gluconate Cloth  6 each Topical Q0600   escitalopram  20 mg Oral Daily   furosemide  80 mg Oral BID   heparin injection (subcutaneous)  5,000 Units Subcutaneous Q8H   heparin sodium (porcine)       hydrALAZINE  25 mg Oral Q8H   insulin aspart  0-5 Units Subcutaneous QHS   insulin aspart  0-9 Units Subcutaneous TID WC   losartan  100 mg Oral Daily   metoCLOPramide   5 mg Oral TID AC   metoprolol succinate  25 mg Oral Daily   sodium chloride flush  3 mL Intravenous Q12H   sodium chloride, acetaminophen **OR** acetaminophen, bisacodyl, heparin sodium (porcine), hydrALAZINE, HYDROcodone-acetaminophen, mouth rinse, promethazine (PHENERGAN) injection (IM or IVPB), senna-docusate, zolpidem  Assessment/ Plan:  Mark [name] is a 41 y.o.  male with past medical history including hypertension, GERD, diabetes, and end-stage renal disease on peritoneal dialysis.  Patient presents to the emergency department with complaints of abdominal pain and has been admitted for ESRD on dialysis [facility]) [N18.6, Z99.2] ESRD (end stage renal disease) on dialysis ([place]) [N18.6, Z99.2]  [facility] HD-TTS PD catheter last accessed on 08/10/2022.  End stage renal disease with hyperkalemia transitioning to hemodialysis.  Patient successfully transition to hemodialysis.  Receiving scheduled treatment today, UF goal 0.5 to 1 L as tolerated.  Next treatment scheduled for Saturday.  Appreciate renal navigator confirming outpatient clinic of [facility] on a TTS schedule.  We will confirm start date to determine discharge plan.  Patient requesting PD catheter be removed, this will be addressed outpatient.  2. Secondary Hyperparathyroidism:  Lab Results  Component Value Date   CALCIUM 7.9 (L) 08/14/2022   CAION 1.07 (L) 05/23/2022   PHOS 8.9 (H) 08/14/2022     We will continue calcium acetate binders with meals.  Calcium remains decreased but stable. Phosphorus has improved with hemodialysis.  3. Anemia of chronic kidney disease Lab Results  Component Value Date   HGB 11.5 (L) 08/14/2022    Hemoglobin at goal  4. Hypertension with chronic kidney disease. Home regimen includes furosemide, losartan, and metoprolol. All currently held. Blood pressure 144/85, improved.  5.  Nausea, vomiting, unknown etiology.  Primary team to consult GI.   LOS: 7 Aarin Sparkman 8/17/20231:13  PM

## 2022-08-14 NOTE — Progress Notes (Signed)
Provider Notification: Amery MD   Pt just came back from HD. We did vitals and the best BP we got was 175/113. IV hydralazine was given. We will check Bp in 30 minutes.

## 2022-08-14 NOTE — Progress Notes (Signed)
Pt's ride is here. Pt's belongings given to pt.

## 2022-08-14 NOTE — Care Management Important Message (Signed)
Important Message  Patient Details  Name: KEYLER HOGE MRN: [id number] Date of Birth: 06/07/1981   Medicare Important Message Given:  Yes  Patient out of room for dialysis upon time of visit.  Confirmed  copy of Medicare IM still in room for reference.   Dannette Barbara 08/14/2022, 11:35 AM

## 2022-08-14 NOTE — Plan of Care (Signed)

## 2022-08-14 NOTE — Discharge Summary (Signed)
Mark [name] GDJ:[id number] DOB: 1981-01-29 DOA: 08/07/2022  PCP: Pcp, No  Admit date: 08/07/2022 Discharge date: 08/14/2022  Admitted From: home Disposition:  home  Recommendations for Outpatient Follow-up:  Follow up with PCP in 1 week Please obtain BMP/CBC in one week Please follow up dialysis as scheduled. Please follow-up with primary GI  at [facility] in 1 week      Discharge Condition:Stable CODE STATUS:DNR Diet recommendation: Renal diet   Brief/Interim Summary: Per HPI: Mark [name] is a 41 y.o. male with a known history of end-stage renal disease on peritoneal dialysis daily at home, diabetes, GERD, MI, hypertension, chronic anemia presents to the emergency department for evaluation of worsening renal function.  Patient had not done his peritoneal dialysis in the last 12 days because he was frustrated with not feeling well and having to be connected nightly.  He reported decreased PO intake, N/V, diarrhea, abdominal pain.Vascular surgery engaged on this admission.  Underwent placement of dialysis access of the right internal jugular vein with tunneled hemodialysis catheter. Started on HD which he is tolerating. He had nausea and some vomiting due to his gastroparesis.GI consulted , discussed recommended gastroparesis diet, prokinetic medication as tolerated and follow up with [facility]. Renal ok'd for him to take Reglan for 7-10 days . He had HD today and stable for discharge.  ESRD (end stage renal disease) on dialysis [facility]) Vascular surgery placed permacath in the right IJ on 8/14 Started on HD  Setup for HD as outpatient,  HD TTS schedule     Hyperphosphatemia Continue Phos binders       Hyperkalemia Resolved with HD    Type 2 diabetes mellitus with chronic kidney disease, with long-term current use of insulin ([place])  Continue home treatment   Severe malnutrition due to type 2 diabetes mellitus ([place]) As evidenced by severe loss of subcutaneous muscle mass and fat  diffusely     Essential hypertension Continue meds below.  May need med management as his bp improves with HD.   Chronic diastolic CHF (congestive heart failure) (HCC) Euvolemic. Volume mx with HD   Diabetic gastroparesis ([place]) Continue Reglan for few days only Gi consulted, pt had previous w/u and was dx of gastroparesis. Did not follow with GI [facility] nor follows the strict gastroparesis diet. Repeat egd not indicated F[facility]u [facility]      Anxiety and depression Psychiatry consulted, they recommended increasing Lexapro, no inpatient treatment needed.       Anemia due to chronic kidney disease Hemoglobin lower than previous baseline, no clinical bleeding, due to chronic kidney disease -Aranesp per nephrology   Coronary artery disease due to lipid rich plaque - Continue home furosemide, metoprolol  Discharge Diagnoses:  Principal Problem:   ESRD (end stage renal disease) on dialysis [facility]) Active Problems:   Coronary artery disease due to lipid rich plaque   Anemia due to chronic kidney disease   Anxiety and depression   Diabetic gastroparesis (HCC)   Chronic diastolic CHF (congestive heart failure) (HCC)   Essential hypertension   Severe malnutrition due to type 2 diabetes mellitus ([place])   Type 2 diabetes mellitus with chronic kidney disease, with long-term current use of insulin (HCC)   Hyperkalemia   Hyperphosphatemia    Discharge Instructions  Discharge Instructions     Diet - low sodium heart healthy   Complete by: As directed    Discharge instructions   Complete by: As directed    Follow up with dialysis as scheduled.  Eat small meals throughtout the day.   Increase activity slowly   Complete by: As directed       Allergies as of 08/14/2022       Reactions   Metformin Nausea And Vomiting   Stomach issues per pt   Penicillins Rash        Medication List     STOP taking these medications    calcium acetate (Phos Binder) 667 MG/5ML  Soln Commonly known as: PHOSLYRA Replaced by: calcium acetate 667 MG capsule   HYDROcodone-acetaminophen 5-325 MG tablet Commonly known as: NORCO/VICODIN   metoprolol-hydrochlorothiazide 50-25 MG tablet Commonly known as: LOPRESSOR HCT       TAKE these medications    calcium acetate 667 MG capsule Commonly known as: PHOSLO Take 2 capsules (1,334 mg total) by mouth 3 (three) times daily with meals. Replaces: calcium acetate (Phos Binder) 667 MG/5ML Soln   escitalopram 20 MG tablet Commonly known as: LEXAPRO Take 1 tablet (20 mg total) by mouth daily. Start taking on: August 15, 2022   gentamicin cream 0.1 % Commonly known as: GARAMYCIN Apply topically daily.   hydrALAZINE 25 MG tablet Commonly known as: APRESOLINE Take 1 tablet (25 mg total) by mouth 3 (three) times daily.   Lasix 80 MG tablet Generic drug: furosemide Take 80 mg by mouth 2 (two) times daily.   losartan 100 MG tablet Commonly known as: COZAAR SMARTSIG:1 Tablet(s) By Mouth Every Evening What changed: Another medication with the same name was removed. Continue taking this medication, and follow the directions you see here.   metoCLOPramide 5 MG tablet Commonly known as: REGLAN Take 1 tablet (5 mg total) by mouth 3 (three) times daily before meals for 7 days.   metoprolol succinate 25 MG 24 hr tablet Commonly known as: TOPROL-XL Take 25 mg by mouth daily.        Allergies  Allergen Reactions   Metformin Nausea And Vomiting    Stomach issues per pt   Penicillins Rash    Consultations: Psychiatry Nephrology Vascular palliative   Procedures/Studies: DG Abd 1 View  Result Date: 08/13/2022 CLINICAL DATA:  41 year old male with abdominal pain. EXAM: ABDOMEN - 1 VIEW COMPARISON:  None Available. FINDINGS: The bowel gas pattern is normal. Left lower quadrant peritoneal dialysis catheter in place with the coiled portion in the left hemipelvis. No radiographic evidence of kinking or catheter  fracture. No radio-opaque calculi or other significant radiographic abnormality are seen. IMPRESSION: Left abdominal peritoneal dialysis catheter in place without complicating features. Electronically Signed   By: Ruthann Cancer M.D.   On: 08/13/2022 09:06   PERIPHERAL VASCULAR CATHETERIZATION  Result Date: 08/11/2022 See surgical note for result.  DG Chest 1 View  Result Date: 08/11/2022 CLINICAL DATA:  End stage renal disease EXAM: CHEST  1 VIEW COMPARISON:  Radiograph 07/08/2016 FINDINGS: Interval increase in size of cardiac silhouette compared to 2017. Bilateral pleural effusions are present. No pulmonary edema. No pneumothorax. IMPRESSION: Cardiomegaly and bilateral effusions suggest congestive heart failure. Cannot exclude pericardial effusion. Electronically Signed   By: Suzy Bouchard M.D.   On: 08/11/2022 15:00      Subjective: No nausea or vomiting this am. In HD  Discharge Exam: Vitals:   08/14/22 1246 08/14/22 1248  BP: (!) 175/113 (!) 180/115  Pulse: 75   Resp: 16   Temp: 98 F (36.7 C)   SpO2: 98%    Vitals:   08/14/22 1211 08/14/22 1214 08/14/22 1246 08/14/22 1248  BP:   (!) 175/113 Marland Kitchen)  180/115  Pulse: 65  75   Resp: 12  16   Temp:   98 F (36.7 C)   TempSrc:   Oral   SpO2: 97%  98%   Weight: 58.7 kg 58.7 kg    Height:        General: Pt is alert, awake, not in acute distress Cardiovascular: RRR, S1/S2 +, no rubs, no gallops Respiratory: CTA bilaterally, no wheezing, no rhonchi Abdominal: Soft, NT, ND, bowel sounds + Extremities: no edema, no cyanosis    The results of significant diagnostics from this hospitalization (including imaging, microbiology, ancillary and laboratory) are listed below for reference.     Microbiology: No results found for this or any previous visit (from the past 240 hour(s)).   Labs: BNP (last 3 results) No results for input(s): "BNP" in the last 8760 hours. Basic Metabolic Panel: Recent Labs  Lab 08/08/22 0645  08/09/22 0730 08/10/22 0440 08/11/22 0426 08/12/22 0438 08/13/22 0446 08/14/22 0451  NA 141 140 140 141 141 140 141  K 5.8* 5.0 3.8 4.1 4.1 3.5 3.7  CL 111 107 108 109 108 105 103  CO2 13* 18* 20* 18* 18* 25 25  GLUCOSE 134* 90 172* 78 70 72 84  BUN 178* 150* 132* 130* 132* 72* 78*  CREATININE 17.57* 16.92* 14.77* 15.95* 16.12* 10.37* 11.33*  CALCIUM 6.8* 6.8* 6.7* 7.0* 7.0* 7.3* 7.9*  MG 2.8*  --   --   --   --   --   --   PHOS >30.0* >30.0*  --  >30.0*  --   --  8.9*   Liver Function Tests: Recent Labs  Lab 08/07/22 1546 08/08/22 0645 08/09/22 0730 08/11/22 0426  AST 14* 13*  --   --   ALT 11 9  --   --   ALKPHOS 84 76  --   --   BILITOT 0.8 0.8  --   --   PROT 6.1* 5.4*  --   --   ALBUMIN 2.9* 2.6* 2.6* 2.6*   Recent Labs  Lab 08/07/22 1546  LIPASE 33   No results for input(s): "AMMONIA" in the last 168 hours. CBC: Recent Labs  Lab 08/08/22 0645 08/10/22 0440 08/11/22 0426 08/12/22 0438 08/14/22 0451  WBC 3.5* 3.2* 3.9* 3.8* 3.6*  HGB 9.4* 9.2* 10.2* 10.3* 11.5*  HCT 28.5* 27.3* 30.5* 30.6* 34.4*  MCV 89.3 88.3 88.4 88.2 87.8  PLT 160 147* 169 152 170   Cardiac Enzymes: No results for input(s): "CKTOTAL", "CKMB", "CKMBINDEX", "TROPONINI" in the last 168 hours. BNP: Invalid input(s): "POCBNP" CBG: Recent Labs  Lab 08/13/22 1712 08/13/22 2134 08/13/22 2204 08/14/22 0746 08/14/22 1241  GLUCAP 72 69* 82 80 91   D-Dimer No results for input(s): "DDIMER" in the last 72 hours. Hgb A1c No results for input(s): "HGBA1C" in the last 72 hours. Lipid Profile No results for input(s): "CHOL", "HDL", "LDLCALC", "TRIG", "CHOLHDL", "LDLDIRECT" in the last 72 hours. Thyroid function studies No results for input(s): "TSH", "T4TOTAL", "T3FREE", "THYROIDAB" in the last 72 hours.  Invalid input(s): "FREET3" Anemia work up No results for input(s): "VITAMINB12", "FOLATE", "FERRITIN", "TIBC", "IRON", "RETICCTPCT" in the last 72 hours. Urinalysis    Component  Value Date/Time   COLORURINE YELLOW (A) 08/07/2022 2015   APPEARANCEUR HAZY (A) 08/07/2022 2015   APPEARANCEUR Clear 05/04/2012 1042   LABSPEC 1.014 08/07/2022 2015   LABSPEC 1.004 05/04/2012 1042   PHURINE 5.0 08/07/2022 2015   GLUCOSEU 150 (A) 08/07/2022 2015   GLUCOSEU  150 mg/dL 05/04/2012 1042   HGBUR MODERATE (A) 08/07/2022 2015   BILIRUBINUR NEGATIVE 08/07/2022 2015   BILIRUBINUR Negative 05/04/2012 1042   KETONESUR NEGATIVE 08/07/2022 2015   PROTEINUR >=300 (A) 08/07/2022 2015   NITRITE NEGATIVE 08/07/2022 2015   LEUKOCYTESUR NEGATIVE 08/07/2022 2015   LEUKOCYTESUR Negative 05/04/2012 1042   Sepsis Labs Recent Labs  Lab 08/10/22 0440 08/11/22 0426 08/12/22 0438 08/14/22 0451  WBC 3.2* 3.9* 3.8* 3.6*   Microbiology No results found for this or any previous visit (from the past 240 hour(s)).   Time coordinating discharge: Over 30 minutes  SIGNED:   Nolberto Hanlon, MD  [facility] 08/14/2022, 1:01 PM Pager   If 7PM-7AM, please contact night-coverage [email or web address] Password [email or web address]

## 2022-08-21 ENCOUNTER — Encounter: Payer: Self-pay | Admitting: Surgery

## 2022-08-21 ENCOUNTER — Ambulatory Visit: Payer: Self-pay | Admitting: Surgery

## 2022-08-21 ENCOUNTER — Other Ambulatory Visit: Payer: Self-pay

## 2022-08-21 ENCOUNTER — Ambulatory Visit (INDEPENDENT_AMBULATORY_CARE_PROVIDER_SITE_OTHER): Payer: Medicare Other | Admitting: Surgery

## 2022-08-21 VITALS — BP 114/76 | HR 71 | Temp 98.0°F | Ht 70.0 in | Wt 136.8 lb

## 2022-08-21 DIAGNOSIS — Z992 Dependence on renal dialysis: Secondary | ICD-10-CM | POA: Diagnosis not present

## 2022-08-21 DIAGNOSIS — N186 End stage renal disease: Secondary | ICD-10-CM

## 2022-08-21 DIAGNOSIS — Z4901 Encounter for fitting and adjustment of extracorporeal dialysis catheter: Secondary | ICD-10-CM
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
## 2022-08-21 NOTE — Patient Instructions (Signed)
Our surgery scheduler will call you within 24-48 hours to schedule your surgery. Please have the Blue surgery sheet available when speaking with her.  

## 2022-08-22 ENCOUNTER — Telehealth: Payer: Self-pay | Admitting: Surgery

## 2022-08-22 NOTE — Telephone Encounter (Signed)
Patient has been advised of Pre-Admission date/time, and Surgery date.  Surgery Date: 09/03/22 Preadmission Testing Date: 08/27/22 (phone 8a-1p)  Patient has been made aware to call [phone], between 1-3:00pm the day before surgery, to find out what time to arrive for surgery.

## 2022-08-27 ENCOUNTER — Other Ambulatory Visit: Payer: Self-pay

## 2022-08-27 ENCOUNTER — Encounter
Admission: RE | Admit: 2022-08-27 | Discharge: 2022-08-27 | Disposition: A | Discharge status: Home / Self Care | Payer: Medicare Other | Source: Ambulatory Visit | Attending: Surgery | Admitting: Surgery

## 2022-08-27 VITALS — Ht 70.0 in | Wt 145.0 lb

## 2022-08-27 DIAGNOSIS — E1122 Type 2 diabetes mellitus with diabetic chronic kidney disease: Secondary | ICD-10-CM

## 2022-08-27 DIAGNOSIS — Z992 Dependence on renal dialysis: Secondary | ICD-10-CM

## 2022-08-27 DIAGNOSIS — Z01812 Encounter for preprocedural laboratory examination: Secondary | ICD-10-CM

## 2022-08-27 NOTE — Patient Instructions (Addendum)
Your procedure is scheduled on: [facility] September 03, 2022 Report to the Registration Desk on the 1st floor of the [facility]. To find out your arrival time, please call [phone] between [place] on: TUESDAY September 02, 2022 If your arrival time is 6:00 am, do not arrive prior to that time as the [facility] entrance doors do not open until 6:00 am.  REMEMBER: Instructions that are not followed completely may result in serious medical risk, up to and including death; or upon the discretion of your surgeon and anesthesiologist your surgery may need to be rescheduled.  Do not eat food OR DRINK ANY LIQUIDS after midnight the night before surgery.  No gum chewing, lozengers or hard candies.  TAKE THESE MEDICATIONS THE MORNING OF SURGERY WITH A SIP OF WATER: LEXAPRO HYDRALYZINE  One week prior to surgery: Stop Anti-inflammatories (NSAIDS) such as Advil, Aleve, Ibuprofen, Motrin, Naproxen, Naprosyn and Aspirin based products such as Excedrin, Goodys Powder, BC Powder. Stop ANY OVER THE COUNTER supplements until after surgery. You may however, continue to take Tylenol if needed for pain up until the day of surgery.  No Alcohol for 24 hours before or after surgery.  No Smoking including e-cigarettes for 24 hours prior to surgery.  No chewable tobacco products for at least 6 hours prior to surgery.  No nicotine patches on the day of surgery.  Do not use any "recreational" drugs for at least a week prior to your surgery.  Please be advised that the combination of cocaine and anesthesia may have negative outcomes, up to and including death. If you test positive for cocaine, your surgery will be cancelled.  On the morning of surgery brush your teeth with toothpaste and water, you may rinse your mouth with mouthwash if you wish. Do not swallow any toothpaste or mouthwash.  Use CHG wipes as directed on instruction sheet.  Do not wear jewelry, make-up, hairpins, clips or nail  polish.  Do not wear lotions, powders, or perfumes OR DEODORANT  Do not shave body from the neck down 48 hours prior to surgery just in case you cut yourself which could leave a site for infection.  Also, freshly shaved skin may become irritated if using the CHG soap.  Contact lenses, hearing aids and dentures may not be worn into surgery.  Do not bring valuables to the hospital. [facility] is not responsible for any missing/lost belongings or valuables.   Notify your doctor if there is any change in your medical condition (cold, fever, infection).  Wear comfortable clothing (specific to your surgery type) to the hospital.  After surgery, you can help prevent lung complications by doing breathing exercises.  Take deep breaths and cough every 1-2 hours. Your doctor may order a device called an Incentive Spirometer to help you take deep breaths. When coughing or sneezing, hold a pillow firmly against your incision with both hands. This is called "splinting." Doing this helps protect your incision. It also decreases belly discomfort.  If you are being discharged the day of surgery, you will not be allowed to drive home. You will need a responsible adult (18 years or older) to drive you home and stay with you that night.   If you are taking public transportation, you will need to have a responsible adult (18 years or older) with you. Please confirm with your physician that it is acceptable to use public transportation.   Please call the [place] Dept. at [phone] if you have  any questions about these instructions.  Surgery Visitation Policy:  Patients undergoing a surgery or procedure may have two family members or support persons with them as long as the person is not COVID-19 positive or experiencing its symptoms.

## 2022-09-03 ENCOUNTER — Encounter: Payer: Self-pay | Admitting: Surgery

## 2022-09-03 ENCOUNTER — Ambulatory Visit: Payer: Medicare Other | Admitting: Certified Registered"

## 2022-09-03 ENCOUNTER — Other Ambulatory Visit: Payer: Self-pay

## 2022-09-03 ENCOUNTER — Encounter
Admission: RE | Disposition: A | Discharge status: Home / Self Care | Payer: Self-pay | Source: Home / Self Care | Attending: Surgery

## 2022-09-03 ENCOUNTER — Ambulatory Visit
Admission: RE | Admit: 2022-09-03 | Discharge: 2022-09-03 | Disposition: A | Discharge status: Home / Self Care | Payer: Medicare Other | Attending: Surgery | Admitting: Surgery

## 2022-09-03 DIAGNOSIS — K219 Gastro-esophageal reflux disease without esophagitis: Secondary | ICD-10-CM | POA: Insufficient documentation

## 2022-09-03 DIAGNOSIS — N186 End stage renal disease: Secondary | ICD-10-CM | POA: Insufficient documentation

## 2022-09-03 DIAGNOSIS — Z992 Dependence on renal dialysis: Secondary | ICD-10-CM | POA: Insufficient documentation

## 2022-09-03 DIAGNOSIS — I509 Heart failure, unspecified: Secondary | ICD-10-CM | POA: Insufficient documentation

## 2022-09-03 DIAGNOSIS — I2510 Atherosclerotic heart disease of native coronary artery without angina pectoris: Secondary | ICD-10-CM | POA: Diagnosis not present

## 2022-09-03 DIAGNOSIS — Z452 Encounter for adjustment and management of vascular access device: Secondary | ICD-10-CM | POA: Insufficient documentation

## 2022-09-03 DIAGNOSIS — Z4901 Encounter for fitting and adjustment of extracorporeal dialysis catheter: Secondary | ICD-10-CM

## 2022-09-03 DIAGNOSIS — I252 Old myocardial infarction: Secondary | ICD-10-CM | POA: Diagnosis not present

## 2022-09-03 DIAGNOSIS — E1143 Type 2 diabetes mellitus with diabetic autonomic (poly)neuropathy: Secondary | ICD-10-CM | POA: Diagnosis not present

## 2022-09-03 DIAGNOSIS — E11311 Type 2 diabetes mellitus with unspecified diabetic retinopathy with macular edema: Secondary | ICD-10-CM | POA: Diagnosis not present

## 2022-09-03 DIAGNOSIS — F17210 Nicotine dependence, cigarettes, uncomplicated: Secondary | ICD-10-CM | POA: Insufficient documentation

## 2022-09-03 DIAGNOSIS — E1122 Type 2 diabetes mellitus with diabetic chronic kidney disease: Secondary | ICD-10-CM | POA: Diagnosis not present

## 2022-09-03 DIAGNOSIS — Z01812 Encounter for preprocedural laboratory examination: Secondary | ICD-10-CM

## 2022-09-03 DIAGNOSIS — I132 Hypertensive heart and chronic kidney disease with heart failure and with stage 5 chronic kidney disease, or end stage renal disease: Secondary | ICD-10-CM | POA: Insufficient documentation

## 2022-09-03 HISTORY — PX: CAPD REMOVAL: SHX5234

## 2022-09-03 LAB — POCT I-STAT, CHEM 8
BUN: 31 mg/dL — ABNORMAL HIGH (ref 6–20)
Calcium, Ion: 1.06 mmol/L — ABNORMAL LOW (ref 1.15–1.40)
Chloride: 102 mmol/L (ref 98–111)
Creatinine, Ser: 5.1 mg/dL — ABNORMAL HIGH (ref 0.61–1.24)
Glucose, Bld: 90 mg/dL (ref 70–99)
HCT: 30 % — ABNORMAL LOW (ref 39.0–52.0)
Hemoglobin: 10.2 g/dL — ABNORMAL LOW (ref 13.0–17.0)
Potassium: 4.7 mmol/L (ref 3.5–5.1)
Sodium: 136 mmol/L (ref 135–145)
TCO2: 25 mmol/L (ref 22–32)

## 2022-09-03 LAB — GLUCOSE, CAPILLARY
Glucose-Capillary: 76 mg/dL (ref 70–99)
Glucose-Capillary: 89 mg/dL (ref 70–99)

## 2022-09-03 SURGERY — LAPAROSCOPIC REMOVAL CONTINUOUS AMBULATORY PERITONEAL DIALYSIS  (CAPD) CATHETER
Anesthesia: General | Site: Abdomen

## 2022-09-03 MED ORDER — GABAPENTIN 300 MG PO CAPS: 300.0000 mg | ORAL_CAPSULE | ORAL | Status: AC

## 2022-09-03 MED ORDER — GABAPENTIN 300 MG PO CAPS
ORAL_CAPSULE | ORAL | Status: AC
  Administered 2022-09-03: 300 mg via ORAL
  Filled 2022-09-03: qty 1

## 2022-09-03 MED ORDER — CHLORHEXIDINE GLUCONATE 0.12 % MT SOLN: 15.0000 mL | Freq: Once | OROMUCOSAL | Status: AC

## 2022-09-03 MED ORDER — FENTANYL CITRATE (PF) 100 MCG/2ML IJ SOLN
INTRAMUSCULAR | Status: DC | PRN
  Administered 2022-09-03: 25 ug via INTRAVENOUS

## 2022-09-03 MED ORDER — CHLORHEXIDINE GLUCONATE CLOTH 2 % EX PADS
6.0000 | MEDICATED_PAD | Freq: Once | CUTANEOUS | Status: AC
  Administered 2022-09-03: 6 via TOPICAL

## 2022-09-03 MED ORDER — SODIUM CHLORIDE 0.9 % IV SOLN: INTRAVENOUS | Status: DC

## 2022-09-03 MED ORDER — ACETAMINOPHEN 500 MG PO TABS: 1000.0000 mg | ORAL_TABLET | ORAL | Status: AC

## 2022-09-03 MED ORDER — FAMOTIDINE 20 MG PO TABS: 20.0000 mg | ORAL_TABLET | Freq: Once | ORAL | Status: AC

## 2022-09-03 MED ORDER — SUCCINYLCHOLINE CHLORIDE 200 MG/10ML IV SOSY
PREFILLED_SYRINGE | INTRAVENOUS | Status: DC | PRN
  Administered 2022-09-03: 30 mg via INTRAVENOUS

## 2022-09-03 MED ORDER — 0.9 % SODIUM CHLORIDE (POUR BTL) OPTIME
TOPICAL | Status: DC | PRN
  Administered 2022-09-03: 500 mL

## 2022-09-03 MED ORDER — BUPIVACAINE-EPINEPHRINE (PF) 0.25% -1:200000 IJ SOLN
INTRAMUSCULAR | Status: DC | PRN
  Administered 2022-09-03: 15 mL
  Administered 2022-09-03: 5 mL

## 2022-09-03 MED ORDER — GLYCOPYRROLATE 0.2 MG/ML IJ SOLN
INTRAMUSCULAR | Status: DC | PRN
  Administered 2022-09-03: .2 mg via INTRAVENOUS

## 2022-09-03 MED ORDER — CHLORHEXIDINE GLUCONATE 0.12 % MT SOLN
OROMUCOSAL | Status: AC
  Administered 2022-09-03: 15 mL via OROMUCOSAL
  Filled 2022-09-03: qty 15

## 2022-09-03 MED ORDER — ORAL CARE MOUTH RINSE
15.0000 mL | Freq: Once | OROMUCOSAL | Status: AC
Start: 2022-09-03 — End: 2022-09-03

## 2022-09-03 MED ORDER — SUGAMMADEX SODIUM 200 MG/2ML IV SOLN
INTRAVENOUS | Status: DC | PRN
  Administered 2022-09-03: 200 mg via INTRAVENOUS

## 2022-09-03 MED ORDER — PROPOFOL 10 MG/ML IV BOLUS
INTRAVENOUS | Status: DC | PRN
  Administered 2022-09-03: 100 mg via INTRAVENOUS

## 2022-09-03 MED ORDER — HYDROCODONE-ACETAMINOPHEN 5-325 MG PO TABS: 1.0000 | ORAL_TABLET | Freq: Four times a day (QID) | ORAL | 0 refills | Status: DC | PRN

## 2022-09-03 MED ORDER — SODIUM CHLORIDE 0.9 % IV SOLN: INTRAVENOUS | Status: DC | PRN

## 2022-09-03 MED ORDER — MIDAZOLAM HCL 2 MG/2ML IJ SOLN
INTRAMUSCULAR | Status: AC
Start: 2022-09-03 — End: ?
  Filled 2022-09-03: qty 2

## 2022-09-03 MED ORDER — FAMOTIDINE 20 MG PO TABS
ORAL_TABLET | ORAL | Status: AC
  Administered 2022-09-03: 20 mg via ORAL
  Filled 2022-09-03: qty 1

## 2022-09-03 MED ORDER — BUPIVACAINE LIPOSOME 1.3 % IJ SUSP: 20.0000 mL | Freq: Once | INTRAMUSCULAR | Status: DC

## 2022-09-03 MED ORDER — ROCURONIUM BROMIDE 100 MG/10ML IV SOLN
INTRAVENOUS | Status: DC | PRN
  Administered 2022-09-03: 30 mg via INTRAVENOUS

## 2022-09-03 MED ORDER — BUPIVACAINE-EPINEPHRINE (PF) 0.25% -1:200000 IJ SOLN
INTRAMUSCULAR | Status: AC
  Filled 2022-09-03: qty 30

## 2022-09-03 MED ORDER — ACETAMINOPHEN 500 MG PO TABS
ORAL_TABLET | ORAL | Status: AC
  Administered 2022-09-03: 1000 mg via ORAL
  Filled 2022-09-03: qty 2

## 2022-09-03 MED ORDER — LIDOCAINE HCL (CARDIAC) PF 100 MG/5ML IV SOSY
PREFILLED_SYRINGE | INTRAVENOUS | Status: DC | PRN
  Administered 2022-09-03: 50 mg via INTRAVENOUS

## 2022-09-03 MED ORDER — FENTANYL CITRATE (PF) 100 MCG/2ML IJ SOLN
INTRAMUSCULAR | Status: AC
  Filled 2022-09-03: qty 2

## 2022-09-03 SURGICAL SUPPLY — 43 items
ADAPTER CATH DIALYSIS 4X8 IT L (MISCELLANEOUS) ×1 IMPLANT
BIOPATCH WHT 1IN DISK W/4.0 H (GAUZE/BANDAGES/DRESSINGS) ×1 IMPLANT
BLADE CLIPPER SURG (BLADE) ×1 IMPLANT
DERMABOND IMPLANT
DERMABOND ADVANCED (GAUZE/BANDAGES/DRESSINGS) ×1
DERMABOND ADVANCED .7 DNX12 (GAUZE/BANDAGES/DRESSINGS) ×1 IMPLANT
ELECT REM PT RETURN 9FT ADLT (ELECTROSURGICAL) ×1
ELECTRODE REM PT RTRN 9FT ADLT (ELECTROSURGICAL) ×1 IMPLANT
GLOVE ORTHO TXT STRL SZ7.5 (GLOVE) ×1 IMPLANT
GOWN STRL REUS W/ TWL LRG LVL3 (GOWN DISPOSABLE) ×1 IMPLANT
GOWN STRL REUS W/ TWL XL LVL3 (GOWN DISPOSABLE) ×1 IMPLANT
GOWN STRL REUS W/TWL LRG LVL3 (GOWN DISPOSABLE) ×1
GOWN STRL REUS W/TWL XL LVL3 (GOWN DISPOSABLE) ×1
GRASPER SUT TROCAR 14GX15 (MISCELLANEOUS) IMPLANT
HANDLE YANKAUER SUCT BULB TIP (MISCELLANEOUS) IMPLANT
KIT TURNOVER KIT A (KITS) ×1 IMPLANT
MANIFOLD NEPTUNE II (INSTRUMENTS) ×1 IMPLANT
MINICAP W/POVIDONE IODINE SOL (MISCELLANEOUS) ×1 IMPLANT
NDL INSUFFLATION 14GA 120MM (NEEDLE) IMPLANT
NEEDLE HYPO 22GX1.5 SAFETY (NEEDLE) ×1 IMPLANT
NEEDLE INSUFFLATION 14GA 120MM (NEEDLE) IMPLANT
NS IRRIG 500ML POUR BTL (IV SOLUTION) ×1 IMPLANT
PACK LAP CHOLECYSTECTOMY (MISCELLANEOUS) ×1 IMPLANT
PENCIL SMOKE EVACUATOR (MISCELLANEOUS) IMPLANT
SET CYSTO W/LG BORE CLAMP LF (SET/KITS/TRAYS/PACK) ×1 IMPLANT
SET TRANSFER 6 W/TWIST CLAMP 5 (SET/KITS/TRAYS/PACK) ×1 IMPLANT
SET TUBE SMOKE EVAC HIGH FLOW (TUBING) ×1 IMPLANT
SPONGE DRAIN TRACH 4X4 STRL 2S (GAUZE/BANDAGES/DRESSINGS) ×1 IMPLANT
STYLET FALLER (MISCELLANEOUS) IMPLANT
STYLET FALLER MEDIONICS (MISCELLANEOUS) IMPLANT
SUT MNCRL 4-0 (SUTURE) ×1
SUT MNCRL 4-0 27XMFL (SUTURE) ×1
SUT PROLENE 0 CT 2 (SUTURE) ×1 IMPLANT
SUT VIC AB 3-0 SH 27 (SUTURE) ×1
SUT VIC AB 3-0 SH 27X BRD (SUTURE) ×1 IMPLANT
SUT VICRYL 0 AB UR-6 (SUTURE) IMPLANT
SUT VICRYL 0 TIES 12 18 (SUTURE) ×1 IMPLANT
SUTURE MNCRL 4-0 27XMF (SUTURE) ×1 IMPLANT
SYR BULB IRRIG 60ML STRL (SYRINGE) IMPLANT
SYS KII FIOS ACCESS ABD 5X100 (TROCAR)
SYSTEM KII FIOS ACES ABD 5X100 (TROCAR) ×1 IMPLANT
TRAP FLUID SMOKE EVACUATOR (MISCELLANEOUS) ×1 IMPLANT
TROCAR Z-THREAD OPTICAL 5X100M (TROCAR) ×2 IMPLANT

## 2022-09-03 NOTE — Transfer of Care (Addendum)
Immediate Anesthesia Transfer of Care Note  Patient: Mark [name]  Procedure(s) Performed: LAPAROSCOPIC REMOVAL CONTINUOUS AMBULATORY PERITONEAL DIALYSIS  (CAPD) CATHETER (Abdomen)  Patient Location: PACU  Anesthesia Type:General  Level of Consciousness: drowsy  Airway & Oxygen Therapy: Patient Spontanous Breathing and Patient connected to face mask oxygen  Post-op Assessment: Report given to RN and Post -op Vital signs reviewed and stable  Post vital signs: stable  Last Vitals:  Vitals Value Taken Time  BP    Temp    Pulse    Resp    SpO2      Last Pain:  Vitals:   09/03/22 1019  TempSrc: Temporal  PainSc: 0-No pain      Patients Stated Pain Goal: 0 (70/14/10 3013)  Complications: No notable events documented.

## 2022-09-03 NOTE — Interval H&P Note (Signed)
History and Physical Interval Note:  09/03/2022 10:37 AM  Tanda Rockers  has presented today for surgery, with the diagnosis of CKD.  The various methods of treatment have been discussed with the patient and family. After consideration of risks, benefits and other options for treatment, the patient has consented to REMOVAL of [place]  (CAPD) CATHETER (N/A) as a surgical intervention.  The patient's history has been reviewed, patient examined, no change in status, stable for surgery.  I have reviewed the patient's chart and labs.  Questions were answered to the patient's satisfaction.     Ronny Bacon, M.D.
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
## 2022-09-03 NOTE — Discharge Instructions (Signed)
AMBULATORY SURGERY  ?DISCHARGE INSTRUCTIONS ? ? ?The drugs that you were given will stay in your system until tomorrow so for the next 24 hours you should not: ? ?Drive an automobile ?Make any legal decisions ?Drink any alcoholic beverage ? ? ?You may resume regular meals tomorrow.  Today it is better to start with liquids and gradually work up to solid foods. ? ?You may eat anything you prefer, but it is better to start with liquids, then soup and crackers, and gradually work up to solid foods. ? ? ?Please notify your doctor immediately if you have any unusual bleeding, trouble breathing, redness and pain at the surgery site, drainage, fever, or pain not relieved by medication. ? ? ? ?Additional Instructions: ? ? ? ?Please contact your physician with any problems or Same Day Surgery at [phone], Monday through Friday 6 am to 4 pm, or [place] [facility] Main number at [phone].  ?

## 2022-09-03 NOTE — Anesthesia Procedure Notes (Signed)
Procedure Name: Intubation Date/Time: 09/03/2022 11:51 AM  Performed by: Beverely Low, CRNAPre-anesthesia Checklist: Patient identified, Patient being monitored, Timeout performed, Emergency Drugs available and Suction available Patient Re-evaluated:Patient Re-evaluated prior to induction Oxygen Delivery Method: Circle system utilized Preoxygenation: Pre-oxygenation with 100% oxygen Induction Type: IV induction Ventilation: Mask ventilation without difficulty Laryngoscope Size: McGraph and 4 Grade View: Grade I Tube type: Oral Tube size: 7.5 mm Number of attempts: 1 Airway Equipment and Method: Stylet Placement Confirmation: ETT inserted through vocal cords under direct vision, positive ETCO2 and breath sounds checked- equal and bilateral Secured at: 21 cm Tube secured with: Tape Dental Injury: Teeth and Oropharynx as per pre-operative assessment

## 2022-09-03 NOTE — Op Note (Signed)
CAPD catheter removal  Pre-operative Diagnosis: CAPD catheter in situ, patient preference to continue with hemodialysis.  Post-operative Diagnosis: same.    Surgeon: Ronny Bacon, M.D., FACS  Anesthesia: General  Findings: 2 subcutaneous cuffs well incorporated.  No evidence of infection, induration or dysfunction.  Estimated Blood Loss: 10 mL         Specimens: CAPD catheter with cuffs in total.  Discarded.          Complications: none              Procedure Details  The patient was seen again in the Holding Room. The benefits, complications, treatment options, and expected outcomes were discussed with the patient. The risks of bleeding, infection, recurrence of symptoms, failure to resolve symptoms, unanticipated injury, prosthetic placement, prosthetic infection, any of which could require further surgery were reviewed with the patient. The likelihood of improving the patient's symptoms with return to their baseline status is good.  The patient and/or family concurred with the proposed plan, giving informed consent.  The patient was taken to Operating Room, identified and the procedure verified.    Prior to the induction of general anesthesia, antibiotic prophylaxis was administered. VTE prophylaxis was in place.  General anesthesia was then administered and tolerated well. After the induction, the patient was positioned in the supine position and the abdomen and CAPD catheter was prepped with  Chloraprep and draped in the sterile fashion.  A Time Out was held and the above information confirmed.  Local infiltration with quarter percent Marcaine with epinephrine is supplied to each cuff site and the exit site.  The proximal cuff was first mobilized after incision immediately over excised from the adjacent soft tissues, divided from the distal catheter and withdrawn without difficulty.  Apparently the more proximal cuff did not incorporate into the rectus fascia and withdrew easily.  I  attempted to withdraw the the distal catheter without dissecting free the distal cuff.  I then made an incision over the distal cuff dissected out and was able to extract completely from the exit site. There was a small amount of oozing at the apex, but this seem to be well controlled with pressure.  The 2 incision sites were closed with 3-0 Vicryl, and sealed with Dermabond. The exit site was left open and dressed with sterile dressing.  He tolerated procedure well.      Ronny Bacon M.D., [facility] 09/03/2022 12:57 PM

## 2022-09-04 ENCOUNTER — Encounter: Payer: Self-pay | Admitting: Surgery

## 2022-09-08 NOTE — Anesthesia Postprocedure Evaluation (Signed)
Anesthesia Post Note  Patient: NICKOLI BAGHERI  Procedure(s) Performed: LAPAROSCOPIC REMOVAL CONTINUOUS AMBULATORY PERITONEAL DIALYSIS  (CAPD) CATHETER (Abdomen)  Patient location during evaluation: PACU Anesthesia Type: General Level of consciousness: awake and alert Pain management: pain level controlled Vital Signs Assessment: post-procedure vital signs reviewed and stable Respiratory status: spontaneous breathing, nonlabored ventilation, respiratory function stable and patient connected to nasal cannula oxygen Cardiovascular status: blood pressure returned to baseline and stable Postop Assessment: no apparent nausea or vomiting Anesthetic complications: no   No notable events documented.   Last Vitals:  Vitals:   09/03/22 1400 09/03/22 1408  BP: (!) 146/84 (!) 142/93  Pulse: 62 66  Resp: 12 20  Temp: 36.6 C (!) 36.3 C  SpO2: 96% 97%    Last Pain:  Vitals:   09/03/22 1408  TempSrc: Temporal  PainSc:                  Molli Barrows

## 2022-09-18 ENCOUNTER — Encounter: Payer: Self-pay | Admitting: Physician Assistant

## 2022-09-18 ENCOUNTER — Other Ambulatory Visit: Payer: Self-pay

## 2022-09-18 ENCOUNTER — Ambulatory Visit (INDEPENDENT_AMBULATORY_CARE_PROVIDER_SITE_OTHER): Payer: Medicare Other | Admitting: Physician Assistant

## 2022-09-18 VITALS — BP 159/101 | HR 53 | Temp 97.6°F | Ht 70.0 in | Wt 153.0 lb

## 2022-09-18 DIAGNOSIS — N186 End stage renal disease: Secondary | ICD-10-CM | POA: Diagnosis not present

## 2022-09-18 DIAGNOSIS — Z992 Dependence on renal dialysis: Secondary | ICD-10-CM | POA: Diagnosis not present

## 2022-09-18 DIAGNOSIS — Z09 Encounter for follow-up examination after completed treatment for conditions other than malignant neoplasm: Secondary | ICD-10-CM

## 2022-09-18 NOTE — Progress Notes (Signed)
[facility] POST-OP OFFICE VISIT  09/18/2022  HPI: Mark [name] is a 41 y.o. male 15 days s/p CAPD catheter removal with Dr Christian Mate  He is doing well No complaints of abdominal pain No fever, chills, nausea, emesis Bowel movements are regular No issues with incisions No other complaints   Vital signs: BP (!) 159/101   Pulse (!) 53   Temp 97.6 F (36.4 C) (Oral)   Ht 5\' 10"  (1.778 m)   Wt 153 lb (69.4 kg)   SpO2 99%   BMI 21.95 kg/m    Physical Exam: Constitutional: Well appearing male, NAD Abdomen: Soft, non-tender, non-distended, no rebound/guarding Chest: Port in the right chest wall Skin: Laparoscopic incisions are healing well, no erythema or drainage   Assessment/Plan: This is a 41 y.o. male 15 days s/p CAPD catheter removal with Dr Christian Mate   - Pain control prn  - Reviewed wound care recommendation  - Reviewed lifting restrictions; 4 weeks total  - He can follow up on as needed basis; He understands to call with questions/concerns  -- Edison Simon, PA-C [facility] 09/18/2022, 2:11 PM M-F: 7am - 4pm

## 2022-09-18 NOTE — Patient Instructions (Signed)
Please call with any questions or concerns.

## 2022-10-08 ENCOUNTER — Other Ambulatory Visit (INDEPENDENT_AMBULATORY_CARE_PROVIDER_SITE_OTHER): Payer: Self-pay | Admitting: Nurse Practitioner

## 2022-10-08 DIAGNOSIS — N185 Chronic kidney disease, stage 5: Secondary | ICD-10-CM

## 2022-10-09 ENCOUNTER — Ambulatory Visit (INDEPENDENT_AMBULATORY_CARE_PROVIDER_SITE_OTHER): Payer: Medicare Other

## 2022-10-09 ENCOUNTER — Ambulatory Visit (INDEPENDENT_AMBULATORY_CARE_PROVIDER_SITE_OTHER): Payer: Medicare Other | Admitting: Nurse Practitioner

## 2022-10-09 ENCOUNTER — Encounter (INDEPENDENT_AMBULATORY_CARE_PROVIDER_SITE_OTHER): Payer: Self-pay | Admitting: Nurse Practitioner

## 2022-10-09 VITALS — BP 176/111 | HR 60 | Resp 17 | Ht 70.0 in | Wt 145.6 lb

## 2022-10-09 DIAGNOSIS — I10 Essential (primary) hypertension: Secondary | ICD-10-CM

## 2022-10-09 DIAGNOSIS — N185 Chronic kidney disease, stage 5: Secondary | ICD-10-CM

## 2022-10-09 DIAGNOSIS — Z992 Dependence on renal dialysis: Secondary | ICD-10-CM

## 2022-10-09 DIAGNOSIS — N186 End stage renal disease: Secondary | ICD-10-CM | POA: Diagnosis not present

## 2022-10-09 DIAGNOSIS — I120 Hypertensive chronic kidney disease with stage 5 chronic kidney disease or end stage renal disease: Secondary | ICD-10-CM | POA: Diagnosis not present

## 2022-10-10 ENCOUNTER — Telehealth (INDEPENDENT_AMBULATORY_CARE_PROVIDER_SITE_OTHER): Payer: Self-pay

## 2022-10-10 NOTE — Telephone Encounter (Signed)
Spoke with the patient and he is scheduled with Dr. Lucky Cowboy on 10/30/22 for a right brachial axillary graft at [facility]. Pre-op phone call is on 10/21/22 between 8-1 pm. Pre-surgical instructions were discussed and will be mailed.

## 2022-10-12 ENCOUNTER — Encounter (INDEPENDENT_AMBULATORY_CARE_PROVIDER_SITE_OTHER): Payer: Self-pay | Admitting: Nurse Practitioner
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
## 2022-10-21 ENCOUNTER — Other Ambulatory Visit: Payer: Self-pay

## 2022-10-21 ENCOUNTER — Encounter
Admission: RE | Admit: 2022-10-21 | Discharge: 2022-10-21 | Disposition: A | Discharge status: Home / Self Care | Payer: Medicare Other | Source: Ambulatory Visit | Attending: Vascular Surgery | Admitting: Vascular Surgery

## 2022-10-21 ENCOUNTER — Other Ambulatory Visit (INDEPENDENT_AMBULATORY_CARE_PROVIDER_SITE_OTHER): Payer: Self-pay | Admitting: Nurse Practitioner

## 2022-10-21 VITALS — Ht 70.0 in | Wt 145.0 lb

## 2022-10-21 DIAGNOSIS — Z01812 Encounter for preprocedural laboratory examination: Secondary | ICD-10-CM

## 2022-10-21 DIAGNOSIS — E1122 Type 2 diabetes mellitus with diabetic chronic kidney disease: Secondary | ICD-10-CM

## 2022-10-21 DIAGNOSIS — Z992 Dependence on renal dialysis: Secondary | ICD-10-CM

## 2022-10-21 DIAGNOSIS — N186 End stage renal disease: Secondary | ICD-10-CM

## 2022-10-21 NOTE — Patient Instructions (Addendum)
Your procedure is scheduled on: October 30, 2022 THURSDAY  Report to the Registration Desk on the 1st floor of the [facility]. To find out your arrival time, please call [phone] between [place] on: WEDNESDAY October 29, 2022  If your arrival time is 6:00 am, do not arrive prior to that time as the [facility] entrance doors do not open until 6:00 am.  REMEMBER: Instructions that are not followed completely may result in serious medical risk, up to and including death; or upon the discretion of your surgeon and anesthesiologist your surgery may need to be rescheduled.  DO EAT OR DRINK ANYTHING after midnight the night before surgery.  No gum chewing, lozengers or hard candies.  TAKE THESE MEDICATIONS THE MORNING OF SURGERY WITH A SIP OF WATER: ESCITALOPRAM(LEXAPRO) HYDRALAZINE METOPROLOL  One week prior to surgery: Stop Anti-inflammatories (NSAIDS) such as Advil, Aleve, Ibuprofen, Motrin, Naproxen, Naprosyn and Aspirin based products such as Excedrin, Goodys Powder, BC Powder. Stop ANY OVER THE COUNTER supplements until after surgery. You may however, continue to take Tylenol if needed for pain up until the day of surgery.  No Alcohol for 24 hours before or after surgery.  No Smoking including e-cigarettes for 24 hours prior to surgery.  No chewable tobacco products for at least 6 hours prior to surgery.  No nicotine patches on the day of surgery.  Do not use any "recreational" drugs for at least a week prior to your surgery.  Please be advised that the combination of cocaine and anesthesia may have negative outcomes, up to and including death. If you test positive for cocaine, your surgery will be cancelled.  On the morning of surgery brush your teeth with toothpaste and water, you may rinse your mouth with mouthwash if you wish. Do not swallow any toothpaste or mouthwash.  Use CHG wipes as directed on instruction sheet.  Do not wear jewelry, make-up, hairpins, clips  or nail polish.  Do not wear lotions, powders, or perfumes [employer]  Do not shave body from the neck down 48 hours prior to surgery just in case you cut yourself which could leave a site for infection.  Also, freshly shaved skin may become irritated if using the CHG soap.  Contact lenses, hearing aids and dentures may not be worn into surgery.  Do not bring valuables to the hospital. [facility] is not responsible for any missing/lost belongings or valuables.   Notify your doctor if there is any change in your medical condition (cold, fever, infection).  Wear comfortable clothing (specific to your surgery type) to the hospital.  After surgery, you can help prevent lung complications by doing breathing exercises.  Take deep breaths and cough every 1-2 hours. Your doctor may order a device called an Incentive Spirometer to help you take deep breaths. When coughing or sneezing, hold a pillow firmly against your incision with both hands. This is called "splinting." Doing this helps protect your incision. It also decreases belly discomfort.  If you are being discharged the day of surgery, you will not be allowed to drive home. You will need a responsible adult (18 years or older) to drive you home and stay with you that night.   If you are taking public transportation, you will need to have a responsible adult (18 years or older) with you. Please confirm with your physician that it is acceptable to use public transportation.   Please call the [facility] Dept. at [phone] if you  have any questions about these instructions.  Surgery Visitation Policy:  Patients undergoing a surgery or procedure may have two family members or support persons with them as long as the person is not COVID-19 positive or experiencing its symptoms.

## 2022-10-23 ENCOUNTER — Telehealth (INDEPENDENT_AMBULATORY_CARE_PROVIDER_SITE_OTHER): Payer: Self-pay

## 2022-10-23 NOTE — Telephone Encounter (Signed)
Due to unforseen circumstances the patient has to be rescheduled from 10/30/22 to 11/06/22 to have his right brachial axillary graft with Dr. Lucky Cowboy. Patient's mother was called and patient has been rescheduled and new pre-surgical instructions will be mailed.

## 2022-10-24 ENCOUNTER — Encounter: Payer: Self-pay | Admitting: Urgent Care

## 2022-10-24 ENCOUNTER — Encounter
Admission: RE | Admit: 2022-10-24 | Discharge: 2022-10-24 | Disposition: A | Discharge status: Home / Self Care | Payer: Medicare Other | Source: Ambulatory Visit | Attending: Vascular Surgery | Admitting: Vascular Surgery

## 2022-10-24 DIAGNOSIS — N186 End stage renal disease: Secondary | ICD-10-CM | POA: Diagnosis not present

## 2022-10-24 DIAGNOSIS — Z992 Dependence on renal dialysis: Secondary | ICD-10-CM | POA: Diagnosis not present

## 2022-10-24 DIAGNOSIS — Z01812 Encounter for preprocedural laboratory examination: Secondary | ICD-10-CM | POA: Insufficient documentation

## 2022-10-24 LAB — CBC WITH DIFFERENTIAL/PLATELET
Abs Immature Granulocytes: 0.01 10*3/uL (ref 0.00–0.07)
Basophils Absolute: 0 10*3/uL (ref 0.0–0.1)
Basophils Relative: 1 %
Eosinophils Absolute: 0.2 10*3/uL (ref 0.0–0.5)
Eosinophils Relative: 6 %
HCT: 27.8 % — ABNORMAL LOW (ref 39.0–52.0)
Hemoglobin: 9.2 g/dL — ABNORMAL LOW (ref 13.0–17.0)
Immature Granulocytes: 0 %
Lymphocytes Relative: 16 %
Lymphs Abs: 0.6 10*3/uL — ABNORMAL LOW (ref 0.7–4.0)
MCH: 31.6 pg (ref 26.0–34.0)
MCHC: 33.1 g/dL (ref 30.0–36.0)
MCV: 95.5 fL (ref 80.0–100.0)
Monocytes Absolute: 0.4 10*3/uL (ref 0.1–1.0)
Monocytes Relative: 9 %
Neutro Abs: 2.7 10*3/uL (ref 1.7–7.7)
Neutrophils Relative %: 68 %
Platelets: 196 10*3/uL (ref 150–400)
RBC: 2.91 MIL/uL — ABNORMAL LOW (ref 4.22–5.81)
RDW: 14.6 % (ref 11.5–15.5)
WBC: 4 10*3/uL (ref 4.0–10.5)
nRBC: 0 % (ref 0.0–0.2)

## 2022-10-24 LAB — BASIC METABOLIC PANEL
Anion gap: 12 (ref 5–15)
BUN: 64 mg/dL — ABNORMAL HIGH (ref 6–20)
CO2: 23 mmol/L (ref 22–32)
Calcium: 8 mg/dL — ABNORMAL LOW (ref 8.9–10.3)
Chloride: 104 mmol/L (ref 98–111)
Creatinine, Ser: 5.26 mg/dL — ABNORMAL HIGH (ref 0.61–1.24)
GFR, Estimated: 13 mL/min — ABNORMAL LOW (ref >60)
Glucose, Bld: 217 mg/dL — ABNORMAL HIGH (ref 70–99)
Potassium: 4.9 mmol/L (ref 3.5–5.1)
Sodium: 139 mmol/L (ref 135–145)

## 2022-10-24 LAB — TYPE AND SCREEN
ABO/RH(D): A POS
Antibody Screen: NEGATIVE

## 2022-11-05 MED ORDER — SODIUM CHLORIDE 0.9 % IV SOLN: INTRAVENOUS | Status: DC

## 2022-11-05 MED ORDER — CHLORHEXIDINE GLUCONATE CLOTH 2 % EX PADS
6.0000 | MEDICATED_PAD | Freq: Once | CUTANEOUS | Status: AC
  Administered 2022-11-05: 6 via TOPICAL

## 2022-11-05 MED ORDER — FAMOTIDINE 20 MG PO TABS: 20.0000 mg | ORAL_TABLET | Freq: Once | ORAL | Status: AC

## 2022-11-05 MED ORDER — CHLORHEXIDINE GLUCONATE CLOTH 2 % EX PADS
6.0000 | MEDICATED_PAD | Freq: Once | CUTANEOUS | Status: AC
  Administered 2022-11-06: 6 via TOPICAL

## 2022-11-05 MED ORDER — VANCOMYCIN HCL IN DEXTROSE 1-5 GM/200ML-% IV SOLN: 1000.0000 mg | INTRAVENOUS | Status: DC

## 2022-11-06 ENCOUNTER — Other Ambulatory Visit: Payer: Self-pay

## 2022-11-06 ENCOUNTER — Ambulatory Visit: Payer: Medicare Other | Admitting: Certified Registered Nurse Anesthetist

## 2022-11-06 ENCOUNTER — Encounter: Payer: Self-pay | Admitting: Vascular Surgery

## 2022-11-06 ENCOUNTER — Ambulatory Visit
Admission: RE | Admit: 2022-11-06 | Discharge: 2022-11-06 | Disposition: A | Discharge status: Home / Self Care | Payer: Medicare Other | Attending: Vascular Surgery | Admitting: Vascular Surgery

## 2022-11-06 ENCOUNTER — Encounter
Admission: RE | Disposition: A | Discharge status: Home / Self Care | Payer: Self-pay | Source: Home / Self Care | Attending: Vascular Surgery

## 2022-11-06 DIAGNOSIS — I130 Hypertensive heart and chronic kidney disease with heart failure and stage 1 through stage 4 chronic kidney disease, or unspecified chronic kidney disease: Secondary | ICD-10-CM | POA: Insufficient documentation

## 2022-11-06 DIAGNOSIS — E1143 Type 2 diabetes mellitus with diabetic autonomic (poly)neuropathy: Secondary | ICD-10-CM | POA: Insufficient documentation

## 2022-11-06 DIAGNOSIS — I2510 Atherosclerotic heart disease of native coronary artery without angina pectoris: Secondary | ICD-10-CM | POA: Insufficient documentation

## 2022-11-06 DIAGNOSIS — E1165 Type 2 diabetes mellitus with hyperglycemia: Secondary | ICD-10-CM | POA: Insufficient documentation

## 2022-11-06 DIAGNOSIS — H5461 Unqualified visual loss, right eye, normal vision left eye: Secondary | ICD-10-CM | POA: Diagnosis not present

## 2022-11-06 DIAGNOSIS — I509 Heart failure, unspecified: Secondary | ICD-10-CM | POA: Insufficient documentation

## 2022-11-06 DIAGNOSIS — I252 Old myocardial infarction: Secondary | ICD-10-CM | POA: Insufficient documentation

## 2022-11-06 DIAGNOSIS — Z992 Dependence on renal dialysis: Secondary | ICD-10-CM | POA: Diagnosis not present

## 2022-11-06 DIAGNOSIS — E1136 Type 2 diabetes mellitus with diabetic cataract: Secondary | ICD-10-CM | POA: Insufficient documentation

## 2022-11-06 DIAGNOSIS — K3184 Gastroparesis: Secondary | ICD-10-CM | POA: Insufficient documentation

## 2022-11-06 DIAGNOSIS — Z01812 Encounter for preprocedural laboratory examination: Secondary | ICD-10-CM

## 2022-11-06 DIAGNOSIS — N186 End stage renal disease: Secondary | ICD-10-CM | POA: Diagnosis present

## 2022-11-06 DIAGNOSIS — E1122 Type 2 diabetes mellitus with diabetic chronic kidney disease: Secondary | ICD-10-CM | POA: Insufficient documentation

## 2022-11-06 DIAGNOSIS — K219 Gastro-esophageal reflux disease without esophagitis: Secondary | ICD-10-CM | POA: Diagnosis not present

## 2022-11-06 HISTORY — PX: AV FISTULA PLACEMENT: SHX1204

## 2022-11-06 LAB — ABO/RH: ABO/RH(D): A POS

## 2022-11-06 LAB — GLUCOSE, CAPILLARY
Glucose-Capillary: 144 mg/dL — ABNORMAL HIGH (ref 70–99)
Glucose-Capillary: 159 mg/dL — ABNORMAL HIGH (ref 70–99)

## 2022-11-06 SURGERY — ARTERIOVENOUS (AV) FISTULA CREATION
Anesthesia: General | Laterality: Right

## 2022-11-06 MED ORDER — LIDOCAINE HCL (CARDIAC) PF 100 MG/5ML IV SOSY
PREFILLED_SYRINGE | INTRAVENOUS | Status: DC | PRN
  Administered 2022-11-06: 80 mg via INTRAVENOUS

## 2022-11-06 MED ORDER — EPHEDRINE 5 MG/ML INJ
INTRAVENOUS | Status: AC
  Filled 2022-11-06: qty 5

## 2022-11-06 MED ORDER — OXYCODONE HCL 5 MG PO TABS
5.0000 mg | ORAL_TABLET | Freq: Once | ORAL | Status: AC | PRN
  Administered 2022-11-06: 5 mg via ORAL

## 2022-11-06 MED ORDER — CHLORHEXIDINE GLUCONATE 0.12 % MT SOLN: 15.0000 mL | Freq: Once | OROMUCOSAL | Status: AC

## 2022-11-06 MED ORDER — DEXAMETHASONE SODIUM PHOSPHATE 10 MG/ML IJ SOLN
INTRAMUSCULAR | Status: AC
  Filled 2022-11-06: qty 1

## 2022-11-06 MED ORDER — MIDAZOLAM HCL 2 MG/2ML IJ SOLN
INTRAMUSCULAR | Status: DC | PRN
  Administered 2022-11-06: 1 mg via INTRAVENOUS

## 2022-11-06 MED ORDER — PROPOFOL 10 MG/ML IV BOLUS
INTRAVENOUS | Status: AC
  Filled 2022-11-06: qty 40

## 2022-11-06 MED ORDER — PHENYLEPHRINE 80 MCG/ML (10ML) SYRINGE FOR IV PUSH (FOR BLOOD PRESSURE SUPPORT)
PREFILLED_SYRINGE | INTRAVENOUS | Status: DC | PRN
  Administered 2022-11-06: 80 ug via INTRAVENOUS

## 2022-11-06 MED ORDER — HEPARIN SODIUM (PORCINE) 1000 UNIT/ML IJ SOLN
INTRAMUSCULAR | Status: DC | PRN
  Administered 2022-11-06: 3000 [IU] via INTRAVENOUS

## 2022-11-06 MED ORDER — HEMOSTATIC AGENTS (NO CHARGE) OPTIME
TOPICAL | Status: DC | PRN
  Administered 2022-11-06: 1 via TOPICAL

## 2022-11-06 MED ORDER — MIDAZOLAM HCL 2 MG/2ML IJ SOLN
INTRAMUSCULAR | Status: AC
  Filled 2022-11-06: qty 2

## 2022-11-06 MED ORDER — FENTANYL CITRATE (PF) 100 MCG/2ML IJ SOLN
INTRAMUSCULAR | Status: DC | PRN
  Administered 2022-11-06: 50 ug via INTRAVENOUS

## 2022-11-06 MED ORDER — VANCOMYCIN HCL IN DEXTROSE 1-5 GM/200ML-% IV SOLN
INTRAVENOUS | Status: AC
  Filled 2022-11-06: qty 200

## 2022-11-06 MED ORDER — ONDANSETRON HCL 4 MG/2ML IJ SOLN
INTRAMUSCULAR | Status: DC | PRN
  Administered 2022-11-06: 4 mg via INTRAVENOUS

## 2022-11-06 MED ORDER — CEFAZOLIN SODIUM-DEXTROSE 2-4 GM/100ML-% IV SOLN
INTRAVENOUS | Status: AC
  Filled 2022-11-06: qty 100

## 2022-11-06 MED ORDER — ROCURONIUM BROMIDE 10 MG/ML (PF) SYRINGE
PREFILLED_SYRINGE | INTRAVENOUS | Status: AC
  Filled 2022-11-06: qty 10

## 2022-11-06 MED ORDER — HYDROCODONE-ACETAMINOPHEN 5-325 MG PO TABS: 2.0000 | ORAL_TABLET | Freq: Four times a day (QID) | ORAL | 0 refills | Status: AC | PRN

## 2022-11-06 MED ORDER — GLYCOPYRROLATE 0.2 MG/ML IJ SOLN
INTRAMUSCULAR | Status: AC
  Filled 2022-11-06: qty 1

## 2022-11-06 MED ORDER — ONDANSETRON HCL 4 MG/2ML IJ SOLN
INTRAMUSCULAR | Status: AC
  Filled 2022-11-06: qty 2

## 2022-11-06 MED ORDER — PHENYLEPHRINE 80 MCG/ML (10ML) SYRINGE FOR IV PUSH (FOR BLOOD PRESSURE SUPPORT)
PREFILLED_SYRINGE | INTRAVENOUS | Status: AC
  Filled 2022-11-06: qty 10

## 2022-11-06 MED ORDER — ONDANSETRON HCL 4 MG/2ML IJ SOLN: 4.0000 mg | Freq: Once | INTRAMUSCULAR | Status: DC | PRN

## 2022-11-06 MED ORDER — HEPARIN SODIUM (PORCINE) 5000 UNIT/ML IJ SOLN
INTRAMUSCULAR | Status: AC
  Filled 2022-11-06: qty 1

## 2022-11-06 MED ORDER — LIDOCAINE HCL (PF) 2 % IJ SOLN
INTRAMUSCULAR | Status: AC
  Filled 2022-11-06: qty 5

## 2022-11-06 MED ORDER — GLYCOPYRROLATE 0.2 MG/ML IJ SOLN
INTRAMUSCULAR | Status: DC | PRN
  Administered 2022-11-06: .2 mg via INTRAVENOUS

## 2022-11-06 MED ORDER — SODIUM CHLORIDE 0.9 % IV SOLN
INTRAVENOUS | Status: DC | PRN
  Administered 2022-11-06: 501 mL

## 2022-11-06 MED ORDER — OXYCODONE HCL 5 MG PO TABS
ORAL_TABLET | ORAL | Status: AC
  Filled 2022-11-06: qty 1

## 2022-11-06 MED ORDER — CEFAZOLIN SODIUM-DEXTROSE 2-4 GM/100ML-% IV SOLN
2.0000 g | Freq: Once | INTRAVENOUS | Status: AC
  Administered 2022-11-06: 2 g via INTRAVENOUS

## 2022-11-06 MED ORDER — FAMOTIDINE 20 MG PO TABS
ORAL_TABLET | ORAL | Status: AC
  Administered 2022-11-06: 20 mg via ORAL
  Filled 2022-11-06: qty 1

## 2022-11-06 MED ORDER — OXYCODONE HCL 5 MG/5ML PO SOLN: 5.0000 mg | Freq: Once | ORAL | Status: AC | PRN

## 2022-11-06 MED ORDER — ROCURONIUM BROMIDE 100 MG/10ML IV SOLN
INTRAVENOUS | Status: DC | PRN
  Administered 2022-11-06: 80 mg via INTRAVENOUS

## 2022-11-06 MED ORDER — CHLORHEXIDINE GLUCONATE 0.12 % MT SOLN
OROMUCOSAL | Status: AC
  Administered 2022-11-06: 15 mL via OROMUCOSAL
  Filled 2022-11-06: qty 15

## 2022-11-06 MED ORDER — FENTANYL CITRATE (PF) 100 MCG/2ML IJ SOLN
INTRAMUSCULAR | Status: AC
  Filled 2022-11-06: qty 2

## 2022-11-06 MED ORDER — ORAL CARE MOUTH RINSE: 15.0000 mL | Freq: Once | OROMUCOSAL | Status: AC

## 2022-11-06 MED ORDER — EPHEDRINE SULFATE (PRESSORS) 50 MG/ML IJ SOLN
INTRAMUSCULAR | Status: DC | PRN
  Administered 2022-11-06 (×3): 5 mg via INTRAVENOUS

## 2022-11-06 MED ORDER — PROPOFOL 10 MG/ML IV BOLUS
INTRAVENOUS | Status: DC | PRN
  Administered 2022-11-06: 140 mg via INTRAVENOUS

## 2022-11-06 MED ORDER — ACETAMINOPHEN 10 MG/ML IV SOLN: 1000.0000 mg | Freq: Once | INTRAVENOUS | Status: DC | PRN

## 2022-11-06 MED ORDER — SUGAMMADEX SODIUM 200 MG/2ML IV SOLN
INTRAVENOUS | Status: DC | PRN
  Administered 2022-11-06: 200 mg via INTRAVENOUS

## 2022-11-06 MED ORDER — FENTANYL CITRATE (PF) 100 MCG/2ML IJ SOLN
INTRAMUSCULAR | Status: AC
  Administered 2022-11-06: 25 ug via INTRAVENOUS
  Filled 2022-11-06: qty 2

## 2022-11-06 MED ORDER — FENTANYL CITRATE (PF) 100 MCG/2ML IJ SOLN: 25.0000 ug | INTRAMUSCULAR | Status: DC | PRN

## 2022-11-06 MED ORDER — SUCCINYLCHOLINE CHLORIDE 200 MG/10ML IV SOSY
PREFILLED_SYRINGE | INTRAVENOUS | Status: AC
  Filled 2022-11-06: qty 10

## 2022-11-06 SURGICAL SUPPLY — 53 items
ADH SKN CLS APL DERMABOND .7 (GAUZE/BANDAGES/DRESSINGS) ×1
APL PRP STRL LF DISP 70% ISPRP (MISCELLANEOUS) ×1
BAG DECANTER FOR FLEXI CONT (MISCELLANEOUS) ×1 IMPLANT
BLADE SURG SZ11 CARB STEEL (BLADE) ×1 IMPLANT
BOOT SUTURE AID YELLOW STND (SUTURE) ×1 IMPLANT
BRUSH SCRUB EZ  4% CHG (MISCELLANEOUS) ×1
BRUSH SCRUB EZ 4% CHG (MISCELLANEOUS) ×1 IMPLANT
CHLORAPREP W/TINT 26 (MISCELLANEOUS) ×1 IMPLANT
CLIP SPRNG 6 S-JAW DBL (CLIP) ×1 IMPLANT
CLIP SPRNG 6MM S-JAW DBL (CLIP) ×1
DERMABOND ADVANCED .7 DNX12 (GAUZE/BANDAGES/DRESSINGS) ×1 IMPLANT
ELECT CAUTERY BLADE 6.4 (BLADE) ×1 IMPLANT
ELECT REM PT RETURN 9FT ADLT (ELECTROSURGICAL) ×1
ELECTRODE REM PT RTRN 9FT ADLT (ELECTROSURGICAL) ×1 IMPLANT
GLOVE BIO SURGEON STRL SZ7 (GLOVE) ×2 IMPLANT
GOWN STRL REUS W/ TWL LRG LVL3 (GOWN DISPOSABLE) ×2 IMPLANT
GOWN STRL REUS W/ TWL XL LVL3 (GOWN DISPOSABLE) ×1 IMPLANT
GOWN STRL REUS W/TWL LRG LVL3 (GOWN DISPOSABLE) ×2
GOWN STRL REUS W/TWL XL LVL3 (GOWN DISPOSABLE) ×1
HEMOSTAT SURGICEL 2X3 (HEMOSTASIS) ×1 IMPLANT
IV NS 500ML (IV SOLUTION) ×1
IV NS 500ML BAXH (IV SOLUTION) ×1 IMPLANT
KIT TURNOVER KIT A (KITS) ×1 IMPLANT
LABEL OR SOLS (LABEL) ×1 IMPLANT
LOOP RED MAXI  1X406MM (MISCELLANEOUS) ×1
LOOP VESSEL MAXI 1X406 RED (MISCELLANEOUS) ×1 IMPLANT
LOOP VESSEL MINI 0.8X406 BLUE (MISCELLANEOUS) ×1 IMPLANT
LOOPS BLUE MINI 0.8X406MM (MISCELLANEOUS) ×1
MANIFOLD NEPTUNE II (INSTRUMENTS) ×1 IMPLANT
NDL FILTER BLUNT 18X1 1/2 (NEEDLE) ×1 IMPLANT
NEEDLE FILTER BLUNT 18X1 1/2 (NEEDLE) ×1 IMPLANT
NS IRRIG 500ML POUR BTL (IV SOLUTION) ×1 IMPLANT
PACK EXTREMITY ARMC (MISCELLANEOUS) ×1 IMPLANT
PAD PREP 24X41 OB/GYN DISP (PERSONAL CARE ITEMS) ×1 IMPLANT
SOLUTION CELL SAVER (CLIP) ×1 IMPLANT
SPIKE FLUID TRANSFER (MISCELLANEOUS) ×1 IMPLANT
STOCKINETTE 48X4 2 PLY STRL (GAUZE/BANDAGES/DRESSINGS) ×1 IMPLANT
STOCKINETTE STRL 4IN 9604848 (GAUZE/BANDAGES/DRESSINGS) ×1 IMPLANT
SUT MNCRL AB 4-0 PS2 18 (SUTURE) ×1 IMPLANT
SUT PROLENE 6 0 BV (SUTURE) ×4 IMPLANT
SUT SILK 0 SH 30 (SUTURE) ×1 IMPLANT
SUT SILK 2 0 (SUTURE) ×1
SUT SILK 2-0 18XBRD TIE 12 (SUTURE) ×1 IMPLANT
SUT SILK 3 0 (SUTURE) ×1
SUT SILK 3-0 18XBRD TIE 12 (SUTURE) ×1 IMPLANT
SUT SILK 4 0 (SUTURE) ×1
SUT SILK 4-0 18XBRD TIE 12 (SUTURE) ×1 IMPLANT
SUT VIC AB 3-0 SH 27 (SUTURE) ×1
SUT VIC AB 3-0 SH 27X BRD (SUTURE) ×1 IMPLANT
SYR 20ML LL LF (SYRINGE) ×1 IMPLANT
SYR 3ML LL SCALE MARK (SYRINGE) ×1 IMPLANT
TRAP FLUID SMOKE EVACUATOR (MISCELLANEOUS) ×1 IMPLANT
WATER STERILE IRR 500ML POUR (IV SOLUTION) ×1 IMPLANT
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
## 2022-11-06 NOTE — Anesthesia Procedure Notes (Signed)
Procedure Name: Intubation Date/Time: 11/06/2022 1:16 PM  Performed by: Lily Peer, Tashira Torre, CRNAPre-anesthesia Checklist: Patient identified, Emergency Drugs available, Suction available and Patient being monitored Patient Re-evaluated:Patient Re-evaluated prior to induction Oxygen Delivery Method: Circle system utilized Preoxygenation: Pre-oxygenation with 100% oxygen Induction Type: IV induction Ventilation: Mask ventilation without difficulty Laryngoscope Size: McGraph and 4 Grade View: Grade I Tube type: Oral Tube size: 7.0 mm Number of attempts: 1 Airway Equipment and Method: Stylet Placement Confirmation: ETT inserted through vocal cords under direct vision, positive ETCO2 and breath sounds checked- equal and bilateral Secured at: 21 cm Tube secured with: Tape Dental Injury: Teeth and Oropharynx as per pre-operative assessment

## 2022-11-06 NOTE — Interval H&P Note (Signed)
History and Physical Interval Note:  11/06/2022 11:55 AM  Mark [name]  has presented today for surgery, with the diagnosis of ESRD.  The various methods of treatment have been discussed with the patient and family. After consideration of risks, benefits and other options for treatment, the patient has consented to  Procedure(s): INSERTION OF ARTERIOVENOUS (AV) GORE-TEX GRAFT ARM (BRACHIAL AXILLARY) (Right) as a surgical intervention.  The patient's history has been reviewed, patient examined, no change in status, stable for surgery.  I have reviewed the patient's chart and labs.  Questions were answered to the patient's satisfaction.     Leotis Pain

## 2022-11-06 NOTE — Op Note (Signed)
[facility]   OPERATIVE NOTE   PROCEDURE: Right brachiocephalic arteriovenous fistula placement  PRE-OPERATIVE DIAGNOSIS: 1.  ESRD        POST-OPERATIVE DIAGNOSIS: 1. ESRD       SURGEON: Leotis Pain, MD  ASSISTANT(S): Annalee Genta, NP  ANESTHESIA: general  ESTIMATED BLOOD LOSS: 10 cc  FINDING(S): Adequate cephalic vein for fistula creation  SPECIMEN(S):  none  INDICATIONS:   Mark [name] is a 41 y.o. male who presents with renal failure in need of pemanent dialysis acces.  The patient is scheduled for right arm AVF placement.  The patient is aware the risks include but are not limited to: bleeding, infection, steal syndrome, nerve damage, ischemic monomelic neuropathy, failure to mature, and need for additional procedures.  The patient is aware of the risks of the procedure and elects to proceed forward. An assistant was present during the procedure to help facilitate the exposure and expedite the procedure.   DESCRIPTION: After full informed written consent was obtained from the patient, the patient was brought back to the operating room and placed supine upon the operating table.  Prior to induction, the patient received IV antibiotics.   After obtaining adequate anesthesia, the patient was then prepped and draped in the standard fashion for a righ arm access procedure. The assistant provided retraction and mobilization to help facilitate exposure and expedite the procedure throughout the entire procedure.  This included following suture, using retractors, and optimizing lighting.  I made a curvilinear incision at the level of the antecubital fossa and dissected through the subcutaneous tissue and fascia to gain exposure of the brachial artery.  This was noted to be patent and adequate in size for fistula creation.  This was dissected out proximally and distally and prepared for control with vessel loops .  I then dissected out the cephalic vein.  This was  noted to be patent and adequate in size for fistula creation.  I then gave the patient 3000 units of intravenous heparin.  The vein was marked for orientation and the distal segment of the vein was ligated with a  2-0 silk, and the vein was transected.  I then instilled the heparinized saline into the vein and clamped it.  At this point, I reset my exposure of the brachial artery and pulled up control on the vessel loops.  I made an arteriotomy with a #11 blade, and then I extended the arteriotomy with a Potts scissor.  I injected heparinized saline proximal and distal to this arteriotomy.  The vein was then sewn to the artery in an end-to-side configuration with a running stitch of 6-0 Prolene.  Prior to completing this anastomosis, I allowed the vein and artery to backbleed.  There was no evidence of clot from any vessels.  I completed the anastomosis in the usual fashion and then released all vessel loops and clamps.  There was a palpable  thrill in the venous outflow, and there was a palpable pulse in the artery distal to the anastomosis.  At this point, I irrigated out the surgical wound.  Surgicel was placed. There was no further active bleeding.  The subcutaneous tissue was reapproximated with a running stitch of 3-0 Vicryl.  The skin was then closed with a 4-0 Monocryl suture.  The skin was then cleaned, dried, and reinforced with Dermabond.  The patient tolerated this procedure well and was taken to the recovery room in stable condition  COMPLICATIONS: None  CONDITION: Stable  Leotis Pain    11/06/2022, 2:13 PM  This note was created with Dragon Medical transcription system. Any errors in dictation are purely unintentional.

## 2022-11-06 NOTE — Discharge Instructions (Signed)

## 2022-11-06 NOTE — Transfer of Care (Signed)
Immediate Anesthesia Transfer of Care Note  Patient: Mark [name]  Procedure(s) Performed: ARTERIOVENOUS (AV) FISTULA CREATION (Right)  Patient Location: PACU  Anesthesia Type:General  Level of Consciousness: awake, alert , and oriented  Airway & Oxygen Therapy: Patient Spontanous Breathing and Patient connected to face mask oxygen  Post-op Assessment: Report given to RN and Post -op Vital signs reviewed and stable  Post vital signs: Reviewed and stable  Last Vitals:  Vitals Value Taken Time  BP 161/109 11/06/22 1418  Temp 36.1 C 11/06/22 1418  Pulse 84 11/06/22 1420  Resp 13 11/06/22 1420  SpO2 94 % 11/06/22 1420  Vitals shown include unvalidated device data.  Last Pain:  Vitals:   11/06/22 1130  TempSrc: Oral  PainSc: 0-No pain         Complications: No notable events documented.

## 2022-11-06 NOTE — Anesthesia Postprocedure Evaluation (Signed)
Anesthesia Post Note  Patient: Mark [name]  Procedure(s) Performed: ARTERIOVENOUS (AV) FISTULA CREATION (Right)  Patient location during evaluation: PACU Anesthesia Type: General Level of consciousness: awake and alert Pain management: pain level controlled Vital Signs Assessment: post-procedure vital signs reviewed and stable Respiratory status: spontaneous breathing, nonlabored ventilation and respiratory function stable Cardiovascular status: blood pressure returned to baseline and stable Postop Assessment: no apparent nausea or vomiting Anesthetic complications: no   No notable events documented.   Last Vitals:  Vitals:   11/06/22 1500 11/06/22 1515  BP: (!) 146/101 (!) 153/97  Pulse: 83 85  Resp: (!) 23 14  Temp:  (!) 36.2 C  SpO2: 94% 92%    Last Pain:  Vitals:   11/06/22 1515  TempSrc:   PainSc: DISH

## 2022-11-10 LAB — POCT I-STAT, CHEM 8
BUN: 47 mg/dL — ABNORMAL HIGH (ref 6–20)
Calcium, Ion: 0.97 mmol/L — ABNORMAL LOW (ref 1.15–1.40)
Chloride: 104 mmol/L (ref 98–111)
Creatinine, Ser: 6.9 mg/dL — ABNORMAL HIGH (ref 0.61–1.24)
Glucose, Bld: 145 mg/dL — ABNORMAL HIGH (ref 70–99)
HCT: 30 % — ABNORMAL LOW (ref 39.0–52.0)
Hemoglobin: 10.2 g/dL — ABNORMAL LOW (ref 13.0–17.0)
Potassium: 5 mmol/L (ref 3.5–5.1)
Sodium: 138 mmol/L (ref 135–145)
TCO2: 24 mmol/L (ref 22–32)

## 2022-12-16 ENCOUNTER — Other Ambulatory Visit (INDEPENDENT_AMBULATORY_CARE_PROVIDER_SITE_OTHER): Payer: Self-pay | Admitting: Vascular Surgery

## 2022-12-16 DIAGNOSIS — Z9889 Other specified postprocedural states: Secondary | ICD-10-CM

## 2022-12-16 DIAGNOSIS — N186 End stage renal disease: Secondary | ICD-10-CM

## 2022-12-17 ENCOUNTER — Ambulatory Visit (INDEPENDENT_AMBULATORY_CARE_PROVIDER_SITE_OTHER): Payer: Medicare Other

## 2022-12-17 ENCOUNTER — Ambulatory Visit (INDEPENDENT_AMBULATORY_CARE_PROVIDER_SITE_OTHER): Payer: Medicare Other | Admitting: Nurse Practitioner

## 2022-12-17 ENCOUNTER — Encounter (INDEPENDENT_AMBULATORY_CARE_PROVIDER_SITE_OTHER): Payer: Self-pay | Admitting: Nurse Practitioner

## 2022-12-17 VITALS — BP 158/84 | HR 68 | Resp 16

## 2022-12-17 DIAGNOSIS — N186 End stage renal disease: Secondary | ICD-10-CM

## 2022-12-17 DIAGNOSIS — Z9889 Other specified postprocedural states: Secondary | ICD-10-CM

## 2022-12-17 DIAGNOSIS — I10 Essential (primary) hypertension: Secondary | ICD-10-CM

## 2022-12-29 ENCOUNTER — Encounter (INDEPENDENT_AMBULATORY_CARE_PROVIDER_SITE_OTHER): Payer: Self-pay | Admitting: Nurse Practitioner
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
## 2023-02-27 ENCOUNTER — Ambulatory Visit (INDEPENDENT_AMBULATORY_CARE_PROVIDER_SITE_OTHER): Payer: Medicare Other | Admitting: Internal Medicine

## 2023-02-27 ENCOUNTER — Encounter: Payer: Self-pay | Admitting: Internal Medicine

## 2023-02-27 VITALS — BP 140/78 | HR 76 | Ht 70.0 in | Wt 154.4 lb

## 2023-02-27 DIAGNOSIS — E1122 Type 2 diabetes mellitus with diabetic chronic kidney disease: Secondary | ICD-10-CM | POA: Diagnosis not present

## 2023-02-27 DIAGNOSIS — E1143 Type 2 diabetes mellitus with diabetic autonomic (poly)neuropathy: Secondary | ICD-10-CM

## 2023-02-27 DIAGNOSIS — Z794 Long term (current) use of insulin: Secondary | ICD-10-CM

## 2023-02-27 DIAGNOSIS — N186 End stage renal disease: Secondary | ICD-10-CM

## 2023-02-27 DIAGNOSIS — K3184 Gastroparesis: Secondary | ICD-10-CM | POA: Diagnosis not present

## 2023-02-27 DIAGNOSIS — Z992 Dependence on renal dialysis: Secondary | ICD-10-CM

## 2023-02-27 LAB — GLUCOSE, POCT (MANUAL RESULT ENTRY): POC Glucose: 179 mg/dl — AB (ref 70–99)

## 2023-02-27 MED ORDER — TRESIBA FLEXTOUCH 200 UNIT/ML ~~LOC~~ SOPN: 14.0000 [IU] | PEN_INJECTOR | SUBCUTANEOUS | 2 refills | Status: DC

## 2023-02-27 MED ORDER — METOCLOPRAMIDE HCL 5 MG PO TABS: 5.0000 mg | ORAL_TABLET | Freq: Three times a day (TID) | ORAL | 0 refills | Status: DC

## 2023-02-27 MED ORDER — DEXCOM G7 SENSOR MISC: 1.0000 | 2 refills | Status: DC

## 2023-02-27 MED ORDER — DEXCOM G7 RECEIVER DEVI: 1.0000 [IU] | Freq: Every day | 0 refills | Status: DC

## 2023-02-27 NOTE — Progress Notes (Signed)
Established Patient Office Visit  Subjective:  Patient ID: Mark [name], male    DOB: 03-May-1981  Age: 42 y.o. MRN: [id number]  Chief Complaint  Patient presents with   Follow-up    2 mo fu    C/o episodic nausea and vomiting with h/o gastroparesis. Also c/o SOBOE and worsening neuropathy with h/o intolerance to gabapentin.     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 ([place])    Primary hypertension    Retinal detachment 05/2019   left eye   Traction retinal detachment, 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    Social History   Socioeconomic History   Marital status: Single    Spouse name: Not on file   Number of children: 2   Years of education: Not on file   Highest education level: Not on file  Occupational History   Not on file  Tobacco Use   Smoking status: Former    Packs/day: 0.50    Types: Cigarettes    Quit date: 05/27/2022    Years since quitting: 0.7   Smokeless tobacco: Never  Vaping Use   Vaping Use: Never used  Substance and Sexual Activity   Alcohol use: No   Drug use: Not Currently   Sexual activity: Not on file  Other Topics Concern   Not on file  Social History Narrative   Lives alone   Social Determinants of Health   Financial Resource Strain: Not on file  Food Insecurity: Not on file  Transportation Needs: Not on file  Physical Activity: Not on file  Stress: Not on file  Social Connections: Not on file  Intimate Partner Violence: Not on file    Family History  Problem Relation Age of Onset   Diabetes Other     Allergies   Allergen Reactions   Metformin Nausea And Vomiting    Stomach issues per pt   Penicillins Rash    Review of Systems  All other systems reviewed and are negative.      Objective:   BP (!) 140/78   Pulse 76   Ht '5\' 10"'$  (1.778 m)   Wt 154 lb 6.4 oz (70 kg)   SpO2 97%   BMI 22.15 kg/m   Vitals:   02/27/23 1121  BP: (!) 140/78  Pulse: 76  Height: '5\' 10"'$  (1.778 m)  Weight: 154 lb 6.4 oz (70 kg)  SpO2: 97%  BMI (Calculated): 22.15    Physical Exam Vitals reviewed.  Constitutional:      Appearance: Normal appearance.  HENT:     Head: Normocephalic.     Left Ear: There is no impacted cerumen.     Nose: Nose normal.     Mouth/Throat:     Mouth: Mucous membranes are moist.     Pharynx: No posterior oropharyngeal erythema.  Eyes:     Extraocular Movements: Extraocular movements intact.     Pupils: Pupils are equal, round, and reactive to light.  Cardiovascular:     Rate  and Rhythm: Regular rhythm.     Chest Wall: PMI is not displaced.     Pulses: Normal pulses.     Heart sounds: Normal heart sounds. No murmur heard. Pulmonary:     Effort: Pulmonary effort is normal.     Breath sounds: Normal air entry. Examination of the right-lower field reveals rales. Examination of the left-lower field reveals rales. Rales present. No rhonchi.  Chest:     Comments: Right [place] permcath in situ without erythema or discharge Abdominal:     General: Abdomen is flat. Bowel sounds are normal. There is no distension.     Palpations: Abdomen is soft. There is no hepatomegaly, splenomegaly or mass.     Tenderness: There is no abdominal tenderness.  Musculoskeletal:        General: Normal range of motion.     Cervical back: Normal range of motion and neck supple.     Right lower leg: 4+ Edema present.     Left lower leg: 4+ Edema present.  Skin:    General: Skin is warm and dry.  Neurological:     General: No focal deficit present.     Mental Status: He is alert and oriented to  person, place, and time.     Cranial Nerves: No cranial nerve deficit.     Motor: No weakness.  Psychiatric:        Mood and Affect: Mood normal.        Behavior: Behavior normal.      Results for orders placed or performed in visit on 02/27/23  POCT Glucose (CBG)  Result Value Ref Range   POC Glucose 179 (A) 70 - 99 mg/dl    Recent Results (from the past 2160 hour(Tashaya Ancrum))  POCT Glucose (CBG)     Status: Abnormal   Collection Time: 02/27/23 11:29 AM  Result Value Ref Range   POC Glucose 179 (A) 70 - 99 mg/dl      Assessment & Plan:   Problem List Items Addressed This Visit   None Visit Diagnoses     Type 2 diabetes mellitus without complication, with long-term current use of insulin (HCC)    -  Primary   Relevant Orders   POCT Glucose (CBG) (Completed)     1. Type 2 diabetes mellitus without complication, with long-term current use of insulin (HCC) - POCT Glucose (CBG)  2. Diabetic gastroparesis (HCC) - metoCLOPramide (REGLAN) 5 MG tablet; Take 1 tablet (5 mg total) by mouth 3 (three) times daily for 14 doses.  Dispense: 14 tablet; Refill: 0 - Ambulatory referral to Gastroenterology  3. Type 2 diabetes mellitus with chronic kidney disease on chronic dialysis, with long-term current use of insulin (HCC) - Continuous Blood Gluc Receiver ([place]) DEVI; 1 Units by Does not apply route daily for 1 day.  Dispense: 1 each; Refill: 0 - Continuous Blood Gluc Sensor (DEXCOM G7 SENSOR) MISC; Apply 1 patch topically once a week.  Dispense: 2 each; Refill: 2 - Lipid panel - TRESIBA FLEXTOUCH 200 UNIT/ML FlexTouch Pen; Inject 14 Units into the skin 1 day or 1 dose.  Dispense: 3 mL; Refill: 2    No follow-ups on file.   Total time spent: 30 minutes  Volanda Napoleon, MD  02/27/2023

## 2023-02-28 LAB — LIPID PANEL
Chol/HDL Ratio: 1.6 ratio (ref 0.0–5.0)
Cholesterol, Total: 84 mg/dL — ABNORMAL LOW (ref 100–199)
HDL: 53 mg/dL (ref >39)
LDL Chol Calc (NIH): 19 mg/dL (ref 0–99)
Triglycerides: 45 mg/dL (ref 0–149)
VLDL Cholesterol Cal: 12 mg/dL (ref 5–40)

## 2023-03-03 ENCOUNTER — Encounter: Payer: Self-pay | Admitting: Internal Medicine

## 2023-03-03 ENCOUNTER — Other Ambulatory Visit: Payer: Self-pay | Admitting: Internal Medicine

## 2023-04-01 ENCOUNTER — Telehealth: Payer: Self-pay

## 2023-04-01 NOTE — Telephone Encounter (Signed)
We received referral for patient for Gastroparesis for patient from [facility] but patient has a appointment with [facility] clinic on 06/18/2023. Faxed referral back to the office informing them of this information.

## 2023-04-07 ENCOUNTER — Other Ambulatory Visit: Payer: Self-pay | Admitting: Internal Medicine

## 2023-04-07 DIAGNOSIS — E1122 Type 2 diabetes mellitus with diabetic chronic kidney disease: Secondary | ICD-10-CM

## 2023-04-10 ENCOUNTER — Ambulatory Visit: Payer: Medicare Other | Admitting: Internal Medicine

## 2023-04-20 ENCOUNTER — Encounter: Payer: Self-pay | Admitting: Nephrology

## 2023-04-29 ENCOUNTER — Encounter: Payer: Self-pay | Admitting: Internal Medicine

## 2023-04-29 ENCOUNTER — Ambulatory Visit (INDEPENDENT_AMBULATORY_CARE_PROVIDER_SITE_OTHER): Payer: Medicare Other | Admitting: Internal Medicine

## 2023-04-29 VITALS — BP 110/70 | HR 67 | Ht 71.0 in | Wt 151.6 lb

## 2023-04-29 DIAGNOSIS — E1122 Type 2 diabetes mellitus with diabetic chronic kidney disease: Secondary | ICD-10-CM

## 2023-04-29 DIAGNOSIS — Z794 Long term (current) use of insulin: Secondary | ICD-10-CM

## 2023-04-29 DIAGNOSIS — N186 End stage renal disease: Secondary | ICD-10-CM

## 2023-04-29 DIAGNOSIS — E1142 Type 2 diabetes mellitus with diabetic polyneuropathy: Secondary | ICD-10-CM

## 2023-04-29 DIAGNOSIS — Z992 Dependence on renal dialysis: Secondary | ICD-10-CM

## 2023-04-29 DIAGNOSIS — E119 Type 2 diabetes mellitus without complications: Secondary | ICD-10-CM

## 2023-04-29 LAB — GLUCOSE, POCT (MANUAL RESULT ENTRY): POC Glucose: 111 mg/dl — AB (ref 70–99)

## 2023-04-29 MED ORDER — TRESIBA FLEXTOUCH 200 UNIT/ML ~~LOC~~ SOPN: 12.0000 [IU] | PEN_INJECTOR | Freq: Every day | SUBCUTANEOUS | 1 refills | Status: DC
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
## 2023-05-01 ENCOUNTER — Other Ambulatory Visit: Payer: Self-pay | Admitting: Nephrology

## 2023-05-01 DIAGNOSIS — N63 Unspecified lump in unspecified breast: Secondary | ICD-10-CM

## 2023-05-01 NOTE — Progress Notes (Signed)
Patient c/o breast tenderness and swelling b/l Will order [place] to evaluate

## 2023-05-05 ENCOUNTER — Other Ambulatory Visit: Payer: Self-pay | Admitting: Nephrology

## 2023-05-05 DIAGNOSIS — N63 Unspecified lump in unspecified breast: Secondary | ICD-10-CM

## 2023-05-18 ENCOUNTER — Inpatient Hospital Stay
Admission: RE | Admit: 2023-05-18 | Discharge: 2023-05-18 | Disposition: A | Discharge status: Home / Self Care | Payer: Self-pay | Source: Ambulatory Visit | Attending: Internal Medicine | Admitting: Internal Medicine

## 2023-05-18 ENCOUNTER — Other Ambulatory Visit: Payer: Self-pay | Admitting: *Deleted

## 2023-05-18 DIAGNOSIS — Z1231 Encounter for screening mammogram for malignant neoplasm of breast: Secondary | ICD-10-CM
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
## 2023-05-19 ENCOUNTER — Encounter: Payer: Self-pay | Admitting: Physician Assistant

## 2023-05-19 ENCOUNTER — Ambulatory Visit (INDEPENDENT_AMBULATORY_CARE_PROVIDER_SITE_OTHER): Payer: Medicare Other | Admitting: Physician Assistant

## 2023-05-19 VITALS — BP 168/89 | HR 76 | Temp 97.9°F | Ht 70.0 in | Wt 157.0 lb

## 2023-05-19 DIAGNOSIS — R112 Nausea with vomiting, unspecified: Secondary | ICD-10-CM | POA: Diagnosis not present

## 2023-05-19 DIAGNOSIS — E1143 Type 2 diabetes mellitus with diabetic autonomic (poly)neuropathy: Secondary | ICD-10-CM

## 2023-05-19 DIAGNOSIS — K909 Intestinal malabsorption, unspecified: Secondary | ICD-10-CM | POA: Diagnosis not present

## 2023-05-19 DIAGNOSIS — R1084 Generalized abdominal pain: Secondary | ICD-10-CM

## 2023-05-19 DIAGNOSIS — R1907 Generalized intra-abdominal and pelvic swelling, mass and lump: Secondary | ICD-10-CM | POA: Diagnosis not present

## 2023-05-19 DIAGNOSIS — R197 Diarrhea, unspecified: Secondary | ICD-10-CM

## 2023-05-19 DIAGNOSIS — K3184 Gastroparesis: Secondary | ICD-10-CM

## 2023-05-19 MED ORDER — ONDANSETRON HCL 4 MG PO TABS
4.0000 mg | ORAL_TABLET | Freq: Three times a day (TID) | ORAL | 3 refills | Status: AC | PRN
Start: 2023-05-19 — End: 2023-08-17

## 2023-05-19 MED ORDER — ZENPEP 40000-126000 UNITS PO CPEP
ORAL_CAPSULE | ORAL | 0 refills | Status: DC
Start: 2023-05-19 — End: 2024-11-10

## 2023-05-19 NOTE — Patient Instructions (Addendum)
CT Abdomen /Pelvis without contrast scheduled 05/28/23 @ 8:45 am. Nothing to eat/drink 4 hours prior.    Gastroparesis  Gastroparesis is a condition in which food takes longer than normal to empty from the stomach. This condition is also known as delayed gastric emptying. It is usually a long-term (chronic) condition. There is no cure, but there are treatments and things that you can do at home to help relieve symptoms. Treating the underlying condition that causes gastroparesis can also help relieve symptoms. What are the causes? In many cases, the cause of this condition is not known. Possible causes include: A hormone (endocrine) disorder, such as hypothyroidism or diabetes. A nervous system disease, such as Parkinson's disease or multiple sclerosis. Cancer, infection, or surgery that affects the stomach or vagus nerve. The vagus nerve runs from your chest, through your neck, and to the lower part of your brain. A connective tissue disorder, such as scleroderma. Certain medicines. What increases the risk? You are more likely to develop this condition if: You have certain disorders or diseases. These may include: An endocrine disorder. An eating disorder. Amyloidosis. Scleroderma. Parkinson's disease. Multiple sclerosis. Cancer or infection of the stomach or the vagus nerve. You have had surgery on your stomach or vagus nerve. You take certain medicines. You are male. What are the signs or symptoms? Symptoms of this condition include: Feeling full after eating very little or a loss of appetite. Nausea, vomiting, or heartburn. Bloating of your abdomen. Inconsistent blood sugar (glucose) levels on blood tests. Unexplained weight loss. Acid from the stomach coming up into the esophagus (gastroesophageal reflux). Sudden tightening (spasm) of the stomach, which can be painful. Symptoms may come and go. Some people may not notice any symptoms. How is this diagnosed? This condition  is diagnosed with tests, such as: Tests that check how long it takes food to move through the stomach and intestines. These tests include: Upper gastrointestinal (GI) series. For this test, you drink a liquid that shows up well on X-rays, and then X-rays are taken of your intestines. Gastric emptying scintigraphy. For this test, you eat food that contains a small amount of radioactive material, and then scans are taken. Wireless capsule GI monitoring system. For this test, you swallow a pill (capsule) that records information about how foods and fluid move through your stomach. Gastric manometry. For this test, a tube is passed down your throat and into your stomach to measure electrical and muscular activity. Endoscopy. For this test, a long, thin tube with a camera and light on the end is passed down your throat and into your stomach to check for problems in your stomach lining. Ultrasound. This test uses sound waves to create images of the inside of your body. This can help rule out gallbladder disease or pancreatitis as a cause of your symptoms. How is this treated? There is no cure for this condition, but treatment and home care may relieve symptoms. Treatment may include: Treating the underlying cause. Managing your symptoms by making changes to your diet and exercise habits. Taking medicines to control nausea and vomiting and to stimulate stomach muscles. Getting food through a feeding tube in the hospital. This may be done in severe cases. Having surgery to insert a device called a gastric electrical stimulator into your body. This device helps improve stomach emptying and control nausea and vomiting. Follow these instructions at home: Take over-the-counter and prescription medicines only as told by your health care provider. Follow instructions from your health care provider  about eating or drinking restrictions. Your health care provider may recommend that you: Eat smaller meals more  often. Eat low-fat foods. Eat low-fiber forms of high-fiber foods. For example, eat cooked vegetables instead of raw vegetables. Have only liquid foods instead of solid foods. Liquid foods are easier to digest. Drink enough fluid to keep your urine pale yellow. Exercise as often as told by your health care provider. Keep all follow-up visits. This is important. Contact a health care provider if you: Notice that your symptoms do not improve with treatment. Have new symptoms. Get help right away if you: Have severe pain in your abdomen that does not improve with treatment. Have nausea that is severe or does not go away. Vomit every time you drink fluids. Summary Gastroparesis is a long-term (chronic) condition in which food takes longer than normal to empty from the stomach. Symptoms include nausea, vomiting, heartburn, bloating of your abdomen, and loss of appetite. Eating smaller portions, low-fat foods, and low-fiber forms of high-fiber foods may help you manage your symptoms. Get help right away if you have severe pain in your abdomen. This information is not intended to replace advice given to you by your health care provider. Make sure you discuss any questions you have with your health care provider. Document Revised: 04/23/2020 Document Reviewed: 04/23/2020 Elsevier Patient Education  2023 [employer].

## 2023-05-28 ENCOUNTER — Ambulatory Visit
Admission: RE | Admit: 2023-05-28 | Discharge: 2023-05-28 | Disposition: A | Discharge status: Home / Self Care | Payer: Medicare Other | Source: Ambulatory Visit | Attending: Nephrology | Admitting: Nephrology

## 2023-05-28 ENCOUNTER — Ambulatory Visit
Admission: RE | Admit: 2023-05-28 | Discharge: 2023-05-28 | Disposition: A | Discharge status: Home / Self Care | Payer: Medicare Other | Source: Ambulatory Visit | Attending: Physician Assistant | Admitting: Physician Assistant

## 2023-05-28 DIAGNOSIS — R1907 Generalized intra-abdominal and pelvic swelling, mass and lump: Secondary | ICD-10-CM | POA: Diagnosis present

## 2023-05-28 DIAGNOSIS — N62 Hypertrophy of breast: Secondary | ICD-10-CM | POA: Diagnosis not present

## 2023-05-28 DIAGNOSIS — R1084 Generalized abdominal pain: Secondary | ICD-10-CM

## 2023-05-28 DIAGNOSIS — Z1239 Encounter for other screening for malignant neoplasm of breast: Secondary | ICD-10-CM | POA: Insufficient documentation

## 2023-05-28 DIAGNOSIS — N6310 Unspecified lump in the right breast, unspecified quadrant: Secondary | ICD-10-CM | POA: Insufficient documentation

## 2023-05-28 DIAGNOSIS — N63 Unspecified lump in unspecified breast: Secondary | ICD-10-CM

## 2023-05-28 DIAGNOSIS — N6320 Unspecified lump in the left breast, unspecified quadrant: Secondary | ICD-10-CM | POA: Diagnosis not present

## 2023-05-28 DIAGNOSIS — R92323 Mammographic fibroglandular density, bilateral breasts: Secondary | ICD-10-CM | POA: Diagnosis not present

## 2023-06-01 ENCOUNTER — Other Ambulatory Visit: Payer: Self-pay | Admitting: Physician Assistant

## 2023-06-01 ENCOUNTER — Telehealth: Payer: Self-pay

## 2023-06-01 DIAGNOSIS — R188 Other ascites: Secondary | ICD-10-CM

## 2023-06-01 MED ORDER — ALBUMIN HUMAN 25 % IV SOLN: 25.0000 g | Freq: Once | INTRAVENOUS | Status: AC

## 2023-06-01 NOTE — Progress Notes (Signed)
Please call and notify patient abdominal pelvic CT shows large amount of ascites (fluid in the abdomen).  Moderate right pleural effusion, most likely due to ascites.  Please schedule labs (CBC, CMP, PT/INR) and a diagnostic paracentesis.

## 2023-06-01 NOTE — Telephone Encounter (Signed)
Please see recent imaging CT results-forwarded to [employer].

## 2023-06-01 NOTE — Telephone Encounter (Signed)
Paracentesis scheduled 06-02-23 @10 :00 [facility]

## 2023-06-02 ENCOUNTER — Ambulatory Visit
Admission: RE | Admit: 2023-06-02 | Discharge: 2023-06-02 | Disposition: A | Discharge status: Home / Self Care | Payer: Medicare Other | Source: Ambulatory Visit | Attending: Physician Assistant | Admitting: Physician Assistant

## 2023-06-02 DIAGNOSIS — R188 Other ascites: Secondary | ICD-10-CM | POA: Diagnosis present

## 2023-06-02 LAB — GLUCOSE, PLEURAL OR PERITONEAL FLUID: Glucose, Fluid: 161 mg/dL

## 2023-06-02 LAB — BODY FLUID CELL COUNT WITH DIFFERENTIAL
Eos, Fluid: 0 %
Lymphs, Fluid: 22 %
Monocyte-Macrophage-Serous Fluid: 68 %
Neutrophil Count, Fluid: 7 %
Other Cells, Fluid: 3 %
Total Nucleated Cell Count, Fluid: 847 cu mm

## 2023-06-02 LAB — BODY FLUID CULTURE W GRAM STAIN

## 2023-06-02 LAB — ALBUMIN, PLEURAL OR PERITONEAL FLUID: Albumin, Fluid: 2.3 g/dL

## 2023-06-02 MED ORDER — LIDOCAINE HCL (PF) 1 % IJ SOLN
10.0000 mL | Freq: Once | INTRAMUSCULAR | Status: AC
  Administered 2023-06-02: 10 mL via SUBCUTANEOUS
  Filled 2023-06-02: qty 10

## 2023-06-02 NOTE — Procedures (Signed)
Ultrasound-guided diagnostic and therapeutic paracentesis performed yielding 4 liters of straw colored fluid.  Fluid was sent to lab for analysis. No immediate complications. EBL is none.  

## 2023-06-02 NOTE — Progress Notes (Signed)
Ultrasound paracentesis showed 4 L of peritoneal fluid removed.  Still awaiting analysis of the fluid.  We will call once we get those results.

## 2023-06-03 NOTE — Progress Notes (Signed)
Hi Tina    Total cell count is 850 and neutrophil count is 7% which is close to 60 which is normal . Anthing over 250 is indicative of SBP.   We can calculate SAAG once we have serum albumin result

## 2023-06-04 ENCOUNTER — Telehealth: Payer: Self-pay

## 2023-06-04 LAB — BODY FLUID CULTURE W GRAM STAIN

## 2023-06-04 NOTE — Telephone Encounter (Signed)
   Patient notified. These call patient and notify him the analysis of fluid in his abdomen showed no evidence of infection.  No evidence of spontaneous bacterial peritonitis (SBP).

## 2023-06-04 NOTE — Progress Notes (Signed)
These call patient and notify him the analysis of fluid in his abdomen showed no evidence of infection.  No evidence of spontaneous bacterial peritonitis (SBP).

## 2023-06-05 LAB — BODY FLUID CULTURE W GRAM STAIN: Gram Stain: NONE SEEN

## 2023-06-05 LAB — CYTOLOGY - NON PAP

## 2023-06-08 ENCOUNTER — Telehealth: Payer: Self-pay

## 2023-06-08 NOTE — Progress Notes (Signed)
We do not have a cmp to know his serum albumin - orders are active but he needs to go and get his labs to calcultae SAAG

## 2023-06-08 NOTE — Telephone Encounter (Signed)
Spoke with patient and he will  call us back. Date / time of lab hours provided.   Call and notify patient he needs to have his lab work done that was previously ordered: CBC, CMP, PT/INR.  Celso Amy, PA-C

## 2023-06-09 ENCOUNTER — Other Ambulatory Visit: Payer: Self-pay

## 2023-06-11 LAB — COMPREHENSIVE METABOLIC PANEL
ALT: 90 IU/L — ABNORMAL HIGH (ref 0–44)
AST: 96 IU/L — ABNORMAL HIGH (ref 0–40)
Albumin/Globulin Ratio: 1.5
Albumin: 3.9 g/dL — ABNORMAL LOW (ref 4.1–5.1)
Alkaline Phosphatase: 199 IU/L — ABNORMAL HIGH (ref 44–121)
BUN/Creatinine Ratio: 7 — ABNORMAL LOW (ref 9–20)
BUN: 30 mg/dL — ABNORMAL HIGH (ref 6–24)
Bilirubin Total: 0.4 mg/dL (ref 0.0–1.2)
CO2: 26 mmol/L (ref 20–29)
Calcium: 8.3 mg/dL — ABNORMAL LOW (ref 8.7–10.2)
Chloride: 100 mmol/L (ref 96–106)
Creatinine, Ser: 4.53 mg/dL — ABNORMAL HIGH (ref 0.76–1.27)
Globulin, Total: 2.6 g/dL (ref 1.5–4.5)
Glucose: 102 mg/dL — ABNORMAL HIGH (ref 70–99)
Potassium: 4.5 mmol/L (ref 3.5–5.2)
Sodium: 140 mmol/L (ref 134–144)
Total Protein: 6.5 g/dL (ref 6.0–8.5)
eGFR: 16 mL/min/{1.73_m2} — ABNORMAL LOW (ref >59)

## 2023-06-11 LAB — CBC
Hematocrit: 37.1 % — ABNORMAL LOW (ref 37.5–51.0)
Hemoglobin: 12.1 g/dL — ABNORMAL LOW (ref 13.0–17.7)
MCH: 30.3 pg (ref 26.6–33.0)
MCHC: 32.6 g/dL (ref 31.5–35.7)
MCV: 93 fL (ref 79–97)
Platelets: 210 10*3/uL (ref 150–450)
RBC: 3.99 x10E6/uL — ABNORMAL LOW (ref 4.14–5.80)
RDW: 13.6 % (ref 11.6–15.4)
WBC: 3.2 10*3/uL — ABNORMAL LOW (ref 3.4–10.8)

## 2023-06-11 LAB — PROTIME-INR
INR: 1.1 (ref 0.9–1.2)
Prothrombin Time: 11.9 s (ref 9.1–12.0)

## 2023-06-12 ENCOUNTER — Telehealth: Payer: Self-pay

## 2023-06-12 DIAGNOSIS — R112 Nausea with vomiting, unspecified: Secondary | ICD-10-CM

## 2023-06-12 DIAGNOSIS — R1084 Generalized abdominal pain: Secondary | ICD-10-CM

## 2023-06-12 DIAGNOSIS — R748 Abnormal levels of other serum enzymes: Secondary | ICD-10-CM

## 2023-06-12 DIAGNOSIS — R188 Other ascites: Secondary | ICD-10-CM

## 2023-06-12 DIAGNOSIS — K909 Intestinal malabsorption, unspecified: Secondary | ICD-10-CM

## 2023-06-12 NOTE — Progress Notes (Signed)
Please call patient and notify labs showed improved stable hemoglobin.  Chronic low white count.  Liver tests are more elevated.  Decreased kidney function consistent with his history of ESRD on dialysis.  Recommend schedule lab visit to check more liver lab work (ANA, AMA, ASMA, alpha-1 antitrypsin, hepatitis A/B/C, PTH, calcium, GGT, alk phos isoenzymes).

## 2023-06-12 NOTE — Telephone Encounter (Signed)
-----   Message from Celso Amy, [place] sent at 06/12/2023 11:08 AM EDT ----- Please call patient and notify labs showed improved stable hemoglobin.  Chronic low white count.  Liver tests are more elevated.  Decreased kidney function consistent with his history of ESRD on dialysis.  Recommend schedule lab visit to check more liver lab work (ANA, AMA, ASMA, alpha-1 antitrypsin, hepatitis A/B/C, PTH, calcium, GGT, alk phos isoenzymes).

## 2023-06-12 NOTE — Progress Notes (Signed)
Mark [name] can we find out if total protein in ascitic fluid was performed, if not can we check if it can be added on ? I am pretty sure we ordered it

## 2023-06-12 NOTE — Telephone Encounter (Signed)
Called patient to let him know the below information. Patient agreed on coming in and having his labs drawn at our office. Lab hours were provided and patient stated that he would be coming in on Monday afternoon after his dialysis.  Inetta Fermo, can you please review the labs and if they are correct, can you please sign them off. I want to make sure that they are all the ones you need. Thank you.

## 2023-06-16 ENCOUNTER — Other Ambulatory Visit (INDEPENDENT_AMBULATORY_CARE_PROVIDER_SITE_OTHER): Payer: Self-pay | Admitting: Nurse Practitioner

## 2023-06-16 DIAGNOSIS — N186 End stage renal disease: Secondary | ICD-10-CM

## 2023-06-16 LAB — CERULOPLASMIN: Ceruloplasmin: 28.6 mg/dL (ref 16.0–31.0)

## 2023-06-16 LAB — ALKALINE PHOSPHATASE, ISOENZYMES: Alkaline Phosphatase: 181 IU/L — ABNORMAL HIGH (ref 44–121)

## 2023-06-16 LAB — IMMUNOGLOBULINS A/E/G/M, SERUM: IgM (Immunoglobulin M), Srm: 142 mg/dL (ref 20–172)

## 2023-06-16 LAB — IRON,TIBC AND FERRITIN PANEL: Total Iron Binding Capacity: 248 ug/dL — ABNORMAL LOW (ref 250–450)

## 2023-06-16 LAB — PTH, INTACT AND CALCIUM: PTH: 203 pg/mL — ABNORMAL HIGH (ref 15–65)

## 2023-06-16 LAB — CK: Total CK: 463 U/L — ABNORMAL HIGH (ref 49–439)

## 2023-06-17 ENCOUNTER — Ambulatory Visit (INDEPENDENT_AMBULATORY_CARE_PROVIDER_SITE_OTHER): Payer: Medicare Other | Admitting: Nurse Practitioner

## 2023-06-17 ENCOUNTER — Encounter (INDEPENDENT_AMBULATORY_CARE_PROVIDER_SITE_OTHER): Payer: Medicare Other

## 2023-06-18 LAB — PTH, INTACT AND CALCIUM: Calcium: 7.6 mg/dL — ABNORMAL LOW (ref 8.7–10.2)

## 2023-06-18 LAB — MITOCHONDRIAL/SMOOTH MUSCLE AB PNL: Smooth Muscle Ab: 4 Units (ref 0–19)

## 2023-06-18 LAB — GAMMA GT: GGT: 56 IU/L (ref 0–65)

## 2023-06-18 LAB — TSH: TSH: 1.43 u[IU]/mL (ref 0.450–4.500)

## 2023-06-18 LAB — IRON,TIBC AND FERRITIN PANEL: UIBC: 149 ug/dL (ref 111–343)

## 2023-06-18 LAB — ALPHA-1-ANTITRYPSIN: A-1 Antitrypsin: 195 mg/dL — ABNORMAL HIGH (ref 95–164)

## 2023-06-18 LAB — ALKALINE PHOSPHATASE, ISOENZYMES

## 2023-06-18 NOTE — Progress Notes (Signed)
Notify pt. Labs show: 1.)  NORMAL Iron, ANA, AMA, ASMA, ceruloplasmin, immunoglobulins, GGT, and TSH.  Celiac labs are negative.  No evidence of hemochromatosis (iron overload), autoimmune hepatitis, primary biliary cirrhosis, or celiac disease.  Thyroid test is normal. 2.)  Elevated PTH and low calcium.  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  Follow-up with nephrology for this. 3.)  Alpha-1 antitrypsin is mildly elevated, yet not worrisome.  There is no evidence of alpha 1 antitrypsin deficiency. 4.)  Elevated alkaline phosphatase and normal GGT.  Does not look like a liver source.  May be due to ESRD (kidney disease). 5.)  Keep follow-up OV with Dr. Tobi Bastos 07/07/2023 as scheduled.

## 2023-06-19 ENCOUNTER — Telehealth: Payer: Self-pay

## 2023-06-19 NOTE — Telephone Encounter (Signed)
Patient notified.   1.)  NORMAL Iron, ANA, AMA, ASMA, ceruloplasmin, immunoglobulins, GGT, and TSH.  Celiac labs are negative.  No evidence of hemochromatosis (iron overload), autoimmune hepatitis, primary biliary cirrhosis, or celiac disease.  Thyroid test is normal.  2.)  Elevated PTH and low calcium.  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  Follow-up with nephrology for this.  3.)  Alpha-1 antitrypsin is mildly elevated, yet not worrisome.  There is no evidence of alpha 1 antitrypsin deficiency.  4.)  Elevated alkaline phosphatase and normal GGT.  Does not look like a liver source.  May be due to ESRD (kidney disease).  5.)  Keep follow-up OV with Dr. Tobi Bastos 07/07/2023 as scheduled.

## 2023-06-22 LAB — IMMUNOGLOBULINS A/E/G/M, SERUM
IgA/Immunoglobulin A, Serum: 228 mg/dL (ref 90–386)
IgE (Immunoglobulin E), Serum: 242 IU/mL (ref 6–495)
IgG (Immunoglobin G), Serum: 1036 mg/dL (ref 603–1613)

## 2023-06-22 LAB — ANTI-MICROSOMAL ANTIBODY LIVER / KIDNEY: LKM1 Ab: 0.8 Units (ref 0.0–20.0)

## 2023-06-22 LAB — ALKALINE PHOSPHATASE, ISOENZYMES
INTESTINAL FRAC.: 3 % (ref 0–18)
LIVER FRACTION: 75 % (ref 13–88)

## 2023-06-22 LAB — CELIAC DISEASE AB SCREEN W/RFX
Antigliadin Abs, IgA: 5 units (ref 0–19)
Transglutaminase IgA: <2 U/mL (ref 0–3)

## 2023-06-22 LAB — IRON,TIBC AND FERRITIN PANEL
Ferritin: 560 ng/mL — ABNORMAL HIGH (ref 30–400)
Iron Saturation: 40 % (ref 15–55)
Iron: 99 ug/dL (ref 38–169)

## 2023-06-22 LAB — PTH, INTACT AND CALCIUM

## 2023-06-22 LAB — ANA: Anti Nuclear Antibody (ANA): NEGATIVE

## 2023-06-22 LAB — MITOCHONDRIAL/SMOOTH MUSCLE AB PNL: Mitochondrial Ab: <20 Units (ref 0.0–20.0)

## 2023-06-26 ENCOUNTER — Ambulatory Visit (INDEPENDENT_AMBULATORY_CARE_PROVIDER_SITE_OTHER): Payer: Medicare Other

## 2023-06-26 ENCOUNTER — Ambulatory Visit (INDEPENDENT_AMBULATORY_CARE_PROVIDER_SITE_OTHER): Payer: Medicare Other | Admitting: Nurse Practitioner

## 2023-06-26 ENCOUNTER — Encounter (INDEPENDENT_AMBULATORY_CARE_PROVIDER_SITE_OTHER): Payer: Self-pay | Admitting: Nurse Practitioner

## 2023-06-26 VITALS — BP 152/76 | HR 63 | Resp 16

## 2023-06-26 DIAGNOSIS — N186 End stage renal disease: Secondary | ICD-10-CM | POA: Diagnosis not present

## 2023-06-26 DIAGNOSIS — I10 Essential (primary) hypertension: Secondary | ICD-10-CM | POA: Diagnosis not present

## 2023-06-27 ENCOUNTER — Encounter (INDEPENDENT_AMBULATORY_CARE_PROVIDER_SITE_OTHER): Payer: Self-pay | Admitting: Nurse Practitioner
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
## 2023-07-07 ENCOUNTER — Encounter: Payer: Self-pay | Admitting: Gastroenterology

## 2023-07-07 ENCOUNTER — Ambulatory Visit (INDEPENDENT_AMBULATORY_CARE_PROVIDER_SITE_OTHER): Payer: Medicare Other | Admitting: Gastroenterology

## 2023-07-07 VITALS — BP 153/75 | HR 60 | Temp 98.3°F | Wt 152.8 lb

## 2023-07-07 DIAGNOSIS — R188 Other ascites: Secondary | ICD-10-CM | POA: Diagnosis not present

## 2023-07-07 DIAGNOSIS — R7989 Other specified abnormal findings of blood chemistry: Secondary | ICD-10-CM

## 2023-07-07 DIAGNOSIS — R748 Abnormal levels of other serum enzymes: Secondary | ICD-10-CM

## 2023-07-07 DIAGNOSIS — E213 Hyperparathyroidism, unspecified: Secondary | ICD-10-CM | POA: Diagnosis not present

## 2023-07-07 MED ORDER — ALBUMIN HUMAN 25 % IV SOLN
25.00 g | Freq: Once | INTRAVENOUS | Status: AC
Start: 2023-07-07 — End: ?

## 2023-07-07 NOTE — Progress Notes (Signed)
Wyline Mood MD, MRCP(U.K) [address]  [address]  [place], [place] [address]  Main: [phone]  Fax: [phone]   Primary Care Physician: Sherron Monday, MD  Primary Gastroenterologist:  Dr. Wyline Mood   Chief Complaint  Patient presents with   diabetic gastroparesis    HPI: Mark [name] is a 42 y.o. male    Summary of history :   He was initially seen on May 19, 2023 by our PA Celso Amy for evaluation of gastroparesis.  He has had symptoms for about 2 years.  In addition had episodes of diarrhea for 2 years seen by [facility] 2 years back and had evaluation with EGD and gastric emptying test previously he has type 2 diabetes end-stage renal disease on dialysis awaiting renal transplantation.  05/28/2023 CT abdomen pelvis without contrast demonstrated large amount of ascites moderate right pleural effusion.  Underwent paracentesis 4 L of fluid was taken out.  06/10/2023 creatinine 4.53 alkaline phosphatase 199 AST 96 ALT 90, INR 1.1 ANA, smooth muscle antibody ceruloplasmin GGT TSH celiac serology negative elevated PTH and low calcium suggestive of hyperparathyroidism due to kidney disease.  Antimicrosomal antibody was negative.  PTH 203, IgG 56.  CK was also elevated.  Interval history May 2024-07/07/2023  No issues with nausea vomiting.  Diarrhea has resolved with the Zenpep he has a prescription.  Current Outpatient Medications  Medication Sig Dispense Refill   calcium acetate (PHOSLO) 667 MG capsule Take 667 mg by mouth 3 (three) times daily with meals.     cetirizine (ZYRTEC) 10 MG tablet Take 10 mg by mouth daily.     gentamicin cream (GARAMYCIN) 0.1 % Apply topically daily.     HYDROcodone-acetaminophen (NORCO/VICODIN) 5-325 MG tablet Take 2 tablets by mouth every 6 (six) hours as needed for moderate pain. 20 tablet 0   insulin degludec (TRESIBA FLEXTOUCH) 200 UNIT/ML FlexTouch Pen Inject 12 Units into the skin daily. 6 mL 1   LASIX 80 MG tablet Take 80  mg by mouth 2 (two) times daily.     lidocaine-prilocaine (EMLA) cream Apply 1 Application topically.     losartan (COZAAR) 100 MG tablet      metoprolol succinate (TOPROL-XL) 25 MG 24 hr tablet Take 25 mg by mouth daily.     ondansetron (ZOFRAN) 4 MG tablet Take 1 tablet (4 mg total) by mouth every 8 (eight) hours as needed for nausea or vomiting. 60 tablet 3   Pancrelipase, Lip-Prot-Amyl, (ZENPEP) 40000-126000 units CPEP 2 capsules with each meal. 1 capsule with snack. 96 capsule 0   escitalopram (LEXAPRO) 20 MG tablet Take 1 tablet (20 mg total) by mouth daily. (Patient not taking: Reported on 02/27/2023) 30 tablet 0   Current Facility-Administered Medications  Medication Dose Route Frequency Provider Last Rate Last Admin   albumin human 25 % solution 25 g  25 g Intravenous Once Celso Amy, PA-C        Allergies as of 07/07/2023 - Review Complete 07/07/2023  Allergen Reaction Noted   Metformin Nausea And Vomiting 04/22/2012   Penicillins Rash 07/08/2016      ROS:  General: Negative for anorexia, weight loss, fever, chills, fatigue, weakness. ENT: Negative for hoarseness, difficulty swallowing , nasal congestion. CV: Negative for chest pain, angina, palpitations, dyspnea on exertion, peripheral edema.  Respiratory: Negative for dyspnea at rest, dyspnea on exertion, cough, sputum, wheezing.  GI: See history of present illness. GU:  Negative for dysuria, hematuria, urinary incontinence, urinary frequency, nocturnal urination.  Endo: Negative for unusual weight change.    Physical Examination:   BP (!) 146/73   Pulse 64   Temp 98.3 F (36.8 C) (Oral)   Wt 152 lb 12.8 oz (69.3 kg)   BMI 21.92 kg/m   General: Very thin loss of muscle mass Eyes: No icterus. Conjunctivae pink. Mouth: Oropharyngeal mucosa moist and pink , no lesions erythema or exudate.  Abdomen: Distended free fluid present tense ascites no rigidity no guarding or rebound tenderness.  g.   Extremities: No  lower extremity edema. No clubbing or deformities. Neuro: Alert and oriented x 3.  Grossly intact. Skin: Warm and dry, no jaundice.   Psych: Alert and cooperative, normal mood and affect.   Imaging Studies: VAS US DUPLEX DIALYSIS ACCESS (AVF, AVG)  Result Date: 06/29/2023 DIALYSIS ACCESS Patient Name:  POL HODO  Date of Exam:   06/26/2023 Medical Rec #: [id number]        Accession #:    [id number] Date of Birth: 10/18/1981        Patient Gender: M Patient Age:   36 years Exam Location:  [facility] Procedure:      VAS US DUPLEX DIALYSIS ACCESS (AVF, AVG) Referring Phys: Sheppard Plumber --------------------------------------------------------------------------------  Access Site: Right Upper Extremity. Access Type: Brachial-cephalic AVF. Comparison Study: 12/202/2023 Performing Technologist: Debbe Bales RVS  Examination Guidelines: A complete evaluation includes B-mode imaging, spectral Doppler, color Doppler, and power Doppler as needed of all accessible portions of each vessel. Unilateral testing is considered an integral part of a complete examination. Limited examinations for reoccurring indications may be performed as noted.  Findings: +--------------------+----------+-----------------+--------+ AVF                 PSV (cm/s)Flow Vol (mL/min)Comments +--------------------+----------+-----------------+--------+ Native artery inflow   225          3652                +--------------------+----------+-----------------+--------+ AVF Anastomosis        305                              +--------------------+----------+-----------------+--------+  +---------------+----------+-------------+----------+--------+ OUTFLOW VEIN   PSV (cm/s)Diameter (cm)Depth (cm)Describe +---------------+----------+-------------+----------+--------+ Subclavian vein    13                                    +---------------+----------+-------------+----------+--------+ Confluence         173                                    +---------------+----------+-------------+----------+--------+ Shoulder          428                                    +---------------+----------+-------------+----------+--------+ Prox UA           210                                    +---------------+----------+-------------+----------+--------+ Mid UA            240                                    +---------------+----------+-------------+----------+--------+  Dist UA           276                                    +---------------+----------+-------------+----------+--------+  +--------------+-------------+---------+---------+---------+-------------------+               Diameter (cm)  Depth  Branching   PSV       Flow Volume                                  (cm)             (cm/s)       (ml/min)       +--------------+-------------+---------+---------+---------+-------------------+ Rt Rad Art                                      38                        Dist                                                                      +--------------+-------------+---------+---------+---------+-------------------+  Summary: The Right Brachial Cephalic AVF appears to be patent throughout; Flow Volume appears to be Normal.  *See table(s) above for measurements and observations.  Diagnosing physician: Festus Barren MD Electronically signed by Festus Barren MD on 06/29/2023 at 11:38:09 AM.   --------------------------------------------------------------------------------   Final     Assessment and Plan:   OMARIUS DIAS is a 42 y.o. y/o male with a history of end-stage renal disease awaiting transplantation, history of diabetic gastroparesis evaluated by [facility] in the past.  Recent liver workup was negative except for elevated PTH, alkaline phosphatase CK and transaminases.  He has also come to see [place] for ascites.  Serum albumin ascites gradient is greater than 1.1 but we do not  have total protein available with the fluid to determine if this is related to cirrhosis or cardiac ascites.  With a normal platelet countAnd a serum albumin of 3.9 no gross abnormality of the liver this makes it less likely that he has significant portal hypertension or cirrhosis of the liver  Plan 1.  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  AST and ALT were mildly elevated CK also was elevated I will recheck his transaminases and CK today if his CK is persistently elevated he may need evaluation with rheumatology to rule out a myositis.  TSH has been normal previously. 3.  Ascites will repeat ultrasound paracentesis obtain albumin total protein and compared it with serum levels and determine at follow-up visit etiology of the ascites.  1-2 4.  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  Gastroparesis stable Dr Wyline Mood  MD,MRCP [facility]) Follow up in 2 weeks

## 2023-07-08 LAB — HEPATIC FUNCTION PANEL
ALT: 17 IU/L (ref 0–44)
AST: 14 IU/L (ref 0–40)
Albumin: 3.8 g/dL — ABNORMAL LOW (ref 4.1–5.1)
Alkaline Phosphatase: 123 IU/L — ABNORMAL HIGH (ref 44–121)
Bilirubin Total: 0.3 mg/dL (ref 0.0–1.2)
Bilirubin, Direct: 0.17 mg/dL (ref 0.00–0.40)
Total Protein: 6.2 g/dL (ref 6.0–8.5)

## 2023-07-08 LAB — CK: Total CK: 263 U/L (ref 49–439)

## 2023-07-09 ENCOUNTER — Ambulatory Visit
Admission: RE | Admit: 2023-07-09 | Discharge: 2023-07-09 | Disposition: A | Discharge status: Home / Self Care | Payer: Medicare Other | Source: Ambulatory Visit | Attending: Gastroenterology | Admitting: Gastroenterology

## 2023-07-09 DIAGNOSIS — R188 Other ascites: Secondary | ICD-10-CM | POA: Diagnosis present

## 2023-07-09 LAB — BODY FLUID CELL COUNT WITH DIFFERENTIAL
Eos, Fluid: 0 %
Lymphs, Fluid: 28 %
Monocyte-Macrophage-Serous Fluid: 56 %
Neutrophil Count, Fluid: 16 %
Total Nucleated Cell Count, Fluid: 508 cu mm

## 2023-07-09 LAB — PROTEIN, PLEURAL OR PERITONEAL FLUID: Total protein, fluid: 4.1 g/dL

## 2023-07-09 LAB — ALBUMIN, PLEURAL OR PERITONEAL FLUID: Albumin, Fluid: 2.3 g/dL

## 2023-07-09 LAB — PATHOLOGIST SMEAR REVIEW

## 2023-07-09 MED ORDER — ALBUMIN HUMAN 25 % IV SOLN
INTRAVENOUS | Status: AC
  Filled 2023-07-09: qty 100

## 2023-07-09 MED ORDER — LIDOCAINE HCL (PF) 1 % IJ SOLN
10.0000 mL | Freq: Once | INTRAMUSCULAR | Status: AC
  Administered 2023-07-09: 10 mL via INTRADERMAL
  Filled 2023-07-09: qty 10

## 2023-07-09 MED ORDER — ALBUMIN HUMAN 25 % IV SOLN
12.5000 g | Freq: Once | INTRAVENOUS | Status: AC
  Administered 2023-07-09: 25 g via INTRAVENOUS

## 2023-07-09 MED ORDER — ALBUMIN HUMAN 25 % IV SOLN: 25.0000 g | Freq: Once | INTRAVENOUS | Status: DC

## 2023-07-09 MED ORDER — ALBUMIN HUMAN 25 % IV SOLN
25.0000 g | Freq: Once | INTRAVENOUS | Status: AC
  Administered 2023-07-09: 25 g via INTRAVENOUS

## 2023-07-09 MED ORDER — ALBUMIN HUMAN 25 % IV SOLN: 12.5000 g | Freq: Once | INTRAVENOUS | Status: DC

## 2023-07-09 NOTE — Progress Notes (Signed)
[email or web address]=search_result&selectedTitle=[email or web address]&usage_type=default&display_rank=1#[email or web address]

## 2023-07-09 NOTE — Progress Notes (Signed)
Mark [name]  Please inform patient the following 1.  No clear evidence of liver cirrhosis seen on ultrasound normal size of the spleen, serum albumin platelet count normal which makes it less likely that the ascites is related to the liver.    2.  Evaluation of ascites shows that he has a serum ascites albumin gradient greater than 1.1 but the total protein in the fluid is 4.1 which points towards cardiac ascites.  Reference: Evaluation of adults with ascites from up-to-date under algorithms approach to diagnosis of ascites.  He will require further evaluation of his ascites from a cardiologist   C/c Sherron Monday, MD   Dr Wyline Mood MD,MRCP [facility]) Gastroenterology/Hepatology Pager: [phone]

## 2023-07-09 NOTE — Procedures (Signed)
PROCEDURE SUMMARY:  Successful US guided paracentesis from RUQ. Yielded 4.1 L of clear yellow fluid.  No immediate complications.  Pt tolerated well.   Specimen was sent for labs.  EBL < 5mL  Pattricia Boss D PA-C 07/09/2023 10:18 AM

## 2023-07-10 ENCOUNTER — Telehealth: Payer: Self-pay

## 2023-07-10 NOTE — Telephone Encounter (Signed)
-----   Message from Wyline Mood sent at 07/09/2023 12:54 PM EDT ----- Kandis Cocking  Please inform patient the following 1.  No clear evidence of liver cirrhosis seen on ultrasound normal size of the spleen, serum albumin platelet count normal which makes it less likely that the ascites is related to the liver.    2.  Evaluation of ascites shows that he has a serum ascites albumin gradient greater than 1.1 but the total protein in the fluid is 4.1 which points towards cardiac ascites.  Reference: Evaluation of adults with ascites from up-to-date under algorith ms approach to diagnosis of ascites.  He will require further evaluation of his ascites from a cardiologist   C/c Sherron Monday, MD   Dr Wyline Mood MD,MRCP [facility]) Gastroenterology/Hepatology Pager: [phone]

## 2023-07-10 NOTE — Telephone Encounter (Signed)
Called patient's mother-Cynthia to tell her the below information. She then stated that her son had already been referred to [facility] cardiology but have not heard anything from them. She stated that Harley Hallmark, RN from [facility] transplant [phone] and left her a voicemail to call me back to let me know if she is able to expedite the appointment with t he cardiologist. I let Aram Beecham know that once I hear back from [facility], that I would notify her.

## 2023-07-15 NOTE — Telephone Encounter (Signed)
Aundra Millet, RN from [facility] returned my call and stated that she reached out to the [facility] cardiology department and shew was able to schedule patient an appointment for 07/22/2023. Aundra Millet also stated that she reached out to patient's mother-Cynthia and notified her as well.

## 2023-08-04 ENCOUNTER — Ambulatory Visit: Payer: Medicare Other | Admitting: Internal Medicine

## 2023-08-04 VITALS — BP 130/84 | HR 70 | Ht 70.0 in | Wt 153.6 lb

## 2023-08-04 DIAGNOSIS — N186 End stage renal disease: Secondary | ICD-10-CM

## 2023-08-04 DIAGNOSIS — Z992 Dependence on renal dialysis: Secondary | ICD-10-CM | POA: Diagnosis not present

## 2023-08-04 DIAGNOSIS — E119 Type 2 diabetes mellitus without complications: Secondary | ICD-10-CM

## 2023-08-04 DIAGNOSIS — E1122 Type 2 diabetes mellitus with diabetic chronic kidney disease: Secondary | ICD-10-CM

## 2023-08-04 DIAGNOSIS — Z794 Long term (current) use of insulin: Secondary | ICD-10-CM | POA: Diagnosis not present

## 2023-08-04 LAB — POCT CBG (FASTING - GLUCOSE)-MANUAL ENTRY: Glucose Fasting, POC: 91 mg/dL (ref 70–99)

## 2023-08-04 MED ORDER — DEXCOM G7 SENSOR MISC
1.0000 | 2 refills | Status: AC
Start: 2023-08-04 — End: 2023-10-27

## 2023-08-04 MED ORDER — FIASP FLEXTOUCH 100 UNIT/ML ~~LOC~~ SOPN
10.0000 [IU] | PEN_INJECTOR | Freq: Three times a day (TID) | SUBCUTANEOUS | 2 refills | Status: DC
Start: 2023-08-04 — End: 2023-11-23

## 2023-08-04 MED ORDER — DEXCOM G7 RECEIVER DEVI
1.0000 [IU] | Freq: Every day | 0 refills | Status: AC
Start: 2023-08-04 — End: 2023-08-05

## 2023-08-04 MED ORDER — TRESIBA FLEXTOUCH 200 UNIT/ML ~~LOC~~ SOPN
16.0000 [IU] | PEN_INJECTOR | Freq: Every day | SUBCUTANEOUS | 1 refills | Status: DC
Start: 2023-08-04 — End: 2023-11-23
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
## 2023-11-06 ENCOUNTER — Ambulatory Visit (INDEPENDENT_AMBULATORY_CARE_PROVIDER_SITE_OTHER): Payer: Medicare Other | Admitting: Internal Medicine

## 2023-11-06 ENCOUNTER — Encounter: Payer: Self-pay | Admitting: Internal Medicine

## 2023-11-06 VITALS — BP 140/80 | HR 63 | Ht 70.0 in | Wt 151.6 lb

## 2023-11-06 DIAGNOSIS — M549 Dorsalgia, unspecified: Secondary | ICD-10-CM

## 2023-11-06 DIAGNOSIS — I10 Essential (primary) hypertension: Secondary | ICD-10-CM

## 2023-11-06 DIAGNOSIS — E1122 Type 2 diabetes mellitus with diabetic chronic kidney disease: Secondary | ICD-10-CM | POA: Diagnosis not present

## 2023-11-06 DIAGNOSIS — I83029 Varicose veins of left lower extremity with ulcer of unspecified site: Secondary | ICD-10-CM

## 2023-11-06 DIAGNOSIS — I5032 Chronic diastolic (congestive) heart failure: Secondary | ICD-10-CM

## 2023-11-06 DIAGNOSIS — Z992 Dependence on renal dialysis: Secondary | ICD-10-CM | POA: Diagnosis not present

## 2023-11-06 DIAGNOSIS — Z794 Long term (current) use of insulin: Secondary | ICD-10-CM | POA: Diagnosis not present

## 2023-11-06 DIAGNOSIS — I83019 Varicose veins of right lower extremity with ulcer of unspecified site: Secondary | ICD-10-CM | POA: Diagnosis not present

## 2023-11-06 DIAGNOSIS — L97929 Non-pressure chronic ulcer of unspecified part of left lower leg with unspecified severity: Secondary | ICD-10-CM

## 2023-11-06 DIAGNOSIS — N186 End stage renal disease: Secondary | ICD-10-CM | POA: Diagnosis not present

## 2023-11-06 DIAGNOSIS — L97919 Non-pressure chronic ulcer of unspecified part of right lower leg with unspecified severity: Secondary | ICD-10-CM

## 2023-11-06 LAB — GLUCOSE, POCT (MANUAL RESULT ENTRY): POC Glucose: 101 mg/dL — AB (ref 70–99)

## 2023-11-06 MED ORDER — CYCLOBENZAPRINE HCL 5 MG PO TABS
5.0000 mg | ORAL_TABLET | Freq: Three times a day (TID) | ORAL | 1 refills | Status: AC | PRN
Start: 2023-11-06 — End: 2023-12-16
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
## 2023-11-07 LAB — HEMOGLOBIN A1C
Est. average glucose Bld gHb Est-mCnc: 137 mg/dL
Hgb A1c MFr Bld: 6.4 % — ABNORMAL HIGH (ref 4.8–5.6)

## 2023-11-10 ENCOUNTER — Ambulatory Visit
Admission: RE | Admit: 2023-11-10 | Discharge: 2023-11-10 | Disposition: A | Discharge status: Home / Self Care | Payer: Medicare Other | Source: Ambulatory Visit | Attending: Internal Medicine | Admitting: Internal Medicine

## 2023-11-10 ENCOUNTER — Other Ambulatory Visit: Payer: Medicare Other

## 2023-11-10 ENCOUNTER — Ambulatory Visit
Admission: RE | Admit: 2023-11-10 | Discharge: 2023-11-10 | Disposition: A | Discharge status: Home / Self Care | Payer: Medicare Other | Attending: Internal Medicine | Admitting: Internal Medicine

## 2023-11-10 DIAGNOSIS — M549 Dorsalgia, unspecified: Secondary | ICD-10-CM | POA: Insufficient documentation

## 2023-11-12 ENCOUNTER — Encounter: Payer: Self-pay | Admitting: Internal Medicine

## 2023-11-23 ENCOUNTER — Ambulatory Visit (INDEPENDENT_AMBULATORY_CARE_PROVIDER_SITE_OTHER): Payer: Medicare Other | Admitting: Internal Medicine

## 2023-11-23 ENCOUNTER — Encounter: Payer: Self-pay | Admitting: Internal Medicine

## 2023-11-23 VITALS — BP 134/88 | HR 71 | Ht 70.0 in | Wt 150.0 lb

## 2023-11-23 DIAGNOSIS — E1122 Type 2 diabetes mellitus with diabetic chronic kidney disease: Secondary | ICD-10-CM | POA: Diagnosis not present

## 2023-11-23 DIAGNOSIS — N186 End stage renal disease: Secondary | ICD-10-CM | POA: Diagnosis not present

## 2023-11-23 DIAGNOSIS — Z992 Dependence on renal dialysis: Secondary | ICD-10-CM | POA: Diagnosis not present

## 2023-11-23 DIAGNOSIS — Z794 Long term (current) use of insulin: Secondary | ICD-10-CM | POA: Diagnosis not present

## 2023-11-23 DIAGNOSIS — Z0130 Encounter for examination of blood pressure without abnormal findings: Secondary | ICD-10-CM

## 2023-11-23 LAB — POCT CBG (FASTING - GLUCOSE)-MANUAL ENTRY: Glucose Fasting, POC: 196 mg/dL — AB (ref 70–99)

## 2023-11-23 MED ORDER — FIASP FLEXTOUCH 100 UNIT/ML ~~LOC~~ SOPN
10.0000 [IU] | PEN_INJECTOR | Freq: Three times a day (TID) | SUBCUTANEOUS | 2 refills | Status: AC
Start: 2023-11-23 — End: 2024-02-21

## 2023-11-23 MED ORDER — TRESIBA FLEXTOUCH 200 UNIT/ML ~~LOC~~ SOPN
16.0000 [IU] | PEN_INJECTOR | Freq: Every day | SUBCUTANEOUS | 2 refills | Status: AC
Start: 2023-11-23 — End: 2024-02-21
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
## 2023-12-01 NOTE — Progress Notes (Signed)
Patient notified

## 2023-12-09 ENCOUNTER — Encounter: Payer: Self-pay | Admitting: Ophthalmology

## 2023-12-10 ENCOUNTER — Encounter: Payer: Self-pay | Admitting: Ophthalmology

## 2023-12-10 ENCOUNTER — Encounter: Payer: Medicare Other | Attending: Physician Assistant | Admitting: Physician Assistant

## 2023-12-10 DIAGNOSIS — E11621 Type 2 diabetes mellitus with foot ulcer: Secondary | ICD-10-CM | POA: Diagnosis present

## 2023-12-10 DIAGNOSIS — L97812 Non-pressure chronic ulcer of other part of right lower leg with fat layer exposed: Secondary | ICD-10-CM | POA: Insufficient documentation

## 2023-12-10 DIAGNOSIS — I132 Hypertensive heart and chronic kidney disease with heart failure and with stage 5 chronic kidney disease, or end stage renal disease: Secondary | ICD-10-CM | POA: Diagnosis not present

## 2023-12-10 DIAGNOSIS — I5042 Chronic combined systolic (congestive) and diastolic (congestive) heart failure: Secondary | ICD-10-CM | POA: Insufficient documentation

## 2023-12-10 DIAGNOSIS — Z992 Dependence on renal dialysis: Secondary | ICD-10-CM | POA: Insufficient documentation

## 2023-12-10 DIAGNOSIS — Z87891 Personal history of nicotine dependence: Secondary | ICD-10-CM | POA: Insufficient documentation

## 2023-12-10 DIAGNOSIS — E1122 Type 2 diabetes mellitus with diabetic chronic kidney disease: Secondary | ICD-10-CM | POA: Insufficient documentation

## 2023-12-10 DIAGNOSIS — L97512 Non-pressure chronic ulcer of other part of right foot with fat layer exposed: Secondary | ICD-10-CM | POA: Diagnosis not present

## 2023-12-10 DIAGNOSIS — N186 End stage renal disease: Secondary | ICD-10-CM | POA: Diagnosis not present

## 2023-12-10 NOTE — Anesthesia Preprocedure Evaluation (Addendum)
Anesthesia Evaluation  Patient identified by MRN, date of birth, ID band Patient awake    Reviewed: Allergy & Precautions, H&P , NPO status , Patient's Chart, lab work & pertinent test results  Airway Mallampati: III  TM Distance: >3 FB Neck ROM: Full  Mouth opening: Limited Mouth Opening  Dental no notable dental hx.    Pulmonary former smoker   Pulmonary exam normal breath sounds clear to auscultation       Cardiovascular hypertension, + CAD, + Past MI and +CHF  Normal cardiovascular exam Rhythm:Regular Rate:Normal  06-18-23   1. The left ventricular systolic function is moderately decreased, LVEF is  visually estimated at 35-40%.    2. There is grade I diastolic dysfunction (impaired relaxation).    3. There is mild mitral valve regurgitation.    4. The left atrium is moderately dilated in size.    5. The aortic valve is trileaflet with mildly thickened leaflets with normal  excursion.   6. There is mild aortic regurgitation.    7. The right ventricle is mildly dilated in size, with mildly reduced  systolic function.    8. There is mild pulmonary hypertension.    9. TR maximum velocity: 3.1 m/s  Estimated PASP: 46 mmHg.    10. IVC size and inspiratory change suggest mildly elevated right atrial  pressure. (5-10 mmHg).   HFrEF, presumed Non-Ischemic Cardiomyopathy, hypertension: ACC/AHA stage C, NYHA I-II. Decreased EF correlating with progressive renal disease and concurrent hypertension, EF normal in 2017, first mildly decreased in 2020, more significantly decreased and 35% range in 2023 in 2024. Nuclear stress in 2022 in 2024 without ischemia or significant coronary calcium.     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  Diabetic neuropathy  Diabetic retinopathy  Primary hypertension Retinal detachment Traction retinal detachment, right Gastroparesis Patient reports hx of paracentesis "in the spring" 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) History of kidney stones GERD (gastroesophageal reflux disease) Anemia Myocardial infarction  Chronic systolic heart failure Blind right eye Dialysis patient  Chronic systolic heart failure  ESRD (end stage renal disease) on dialysis  Mild pulmonary hypertension  Mild aortic regurgitation  Mild mitral regurgitation by prior echocardiogram Grade I diastolic dysfunction Non-ischemic cardiomyopathy  Note EF 35-40%  Serum K today is 4.3 Right upper extremity AVF, placed sleeve on right side to not used for BP or venipuncture Fentanyl causes n/v Last dialysis was yesterday, gets dialysis M-W-Fri and has AVF in RUE. 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  Reproductive/Obstetrics negative OB ROS                             Anesthesia Physical Anesthesia Plan  ASA: 4  Anesthesia Plan: MAC   Post-op Pain Management:    Induction: Intravenous  PONV Risk Score and Plan:   Airway Management Planned: Natural Airway and Nasal Cannula  Additional Equipment:  Intra-op Plan:   Post-operative Plan:   Informed Consent: I have reviewed the patients History and Physical, chart, labs and discussed the procedure including the risks, benefits and alternatives for the proposed anesthesia with the patient or authorized representative who has indicated his/her understanding and  acceptance.     Dental Advisory Given  Plan Discussed with: Anesthesiologist, CRNA and Surgeon  Anesthesia Plan Comments: (Patient consented for risks of anesthesia including but not limited to:  - adverse reactions to medications - damage to eyes, teeth, lips or other oral mucosa - nerve damage due to positioning  - sore throat or hoarseness - Damage to heart, brain, nerves, lungs, other parts of body or loss of life  Patient voiced understanding and assent.)       Anesthesia Quick Evaluation

## 2023-12-10 NOTE — Progress Notes (Addendum)
TAMIKO, ELLINGTON A ([id number]) [id number] Page 1 of 11 Visit Report for 12/10/2023 Allergy List Details Patient Name: Date of Service: TO Selinda Orion A. 12/10/2023 2:00 PM Medical Record Number: [id number] Patient Account Number: [id number] Date of Birth/Sex: Treating RN: Aug 06, 1981 (42 y.o. Male) [name] Primary Care Taleigh Gero: Gillermo Murdoch Other Clinician: Referring Katelynne Revak: Treating Teria Khachatryan/Extender: Jethro Bolus, Ahmed Weeks in Treatment: 0 Allergies Active Allergies metformin Severity: Moderate penicillin Severity: Moderate Allergy Notes Electronic Signature(s) Signed: 12/10/2023 2:00:15 PM By: [name] MSN RN CNS WTA Entered By: [name] on 12/10/2023 14:00:15 -------------------------------------------------------------------------------- Arrival Information Details Patient Name: Date of Service: TO Selinda Orion A. 12/10/2023 2:00 PM Medical Record Number: [id number] Patient Account Number: [id number] Date of Birth/Sex: Treating RN: 17-Jan-1981 (42 y.o. Male) [name] Primary Care Hank Walling: Gillermo Murdoch Other Clinician: Referring Nazia Rhines: Treating Kaliyah Gladman/Extender: Azzie Glatter in Treatment: 0 Visit Information Patient Arrived: Ambulatory Arrival Time: 14:40 Accompanied By: self Transfer Assistance: None Patient Identification Verified: Yes Secondary Verification Process Completed: Yes Patient Requires Transmission-Based Precautions: No Patient Has Alerts: Yes Patient Alerts: Diabetic Type 2 ZAYLYN, ERNEY A ([id number]) [id number] Page 2 of 11 Electronic Signature(s) Signed: 12/10/2023 4:33:01 PM By: [name] MSN RN CNS WTA Entered By: [name] on 12/10/2023 16:33:01 -------------------------------------------------------------------------------- Clinic Level of Care Assessment Details Patient Name: Date of Service: TO Selinda Orion A.  12/10/2023 2:00 PM Medical Record Number: [id number] Patient Account Number: [id number] Date of Birth/Sex: Treating RN: 01-07-1981 (42 y.o. Male) [name] Primary Care Silvester Reierson: Gillermo Murdoch Other Clinician: Referring Genetta Fiero: Treating Taiki Buckwalter/Extender: Jethro Bolus, Ahmed Weeks in Treatment: 0 Clinic Level of Care Assessment Items TOOL 2 Quantity Score X- 1 0 Use when only an EandM is performed on the INITIAL visit ASSESSMENTS - Nursing Assessment / Reassessment X- 1 20 General Physical Exam (combine w/ comprehensive assessment (listed just below) when performed on new pt. evals) X- 1 25 Comprehensive Assessment (HX, ROS, Risk Assessments, Wounds Hx, etc.) ASSESSMENTS - Wound and Skin A ssessment / Reassessment []  - 0 Simple Wound Assessment / Reassessment - one wound X- 2 5 Complex Wound Assessment / Reassessment - multiple wounds []  - 0 Dermatologic / Skin Assessment (not related to wound area) ASSESSMENTS - Ostomy and/or Continence Assessment and Care []  - 0 Incontinence Assessment and Management []  - 0 Ostomy Care Assessment and Management (repouching, etc.) PROCESS - Coordination of Care []  - 0 Simple Patient / Family Education for ongoing care X- 1 20 Complex (extensive) Patient / Family Education for ongoing care X- 1 10 Staff obtains Chiropractor, Records, T Results / Process Orders est []  - 0 Staff telephones HHA, Nursing Homes / Clarify orders / etc []  - 0 Routine Transfer to another Facility (non-emergent condition) []  - 0 Routine Hospital Admission (non-emergent condition) X- 1 15 New Admissions / Manufacturing engineer / Ordering NPWT Apligraf, etc. , []  - 0 Emergency Hospital Admission (emergent condition) []  - 0 Simple Discharge Coordination X- 1 15 Complex (extensive) Discharge Coordination PROCESS - Special Needs []  - 0 Pediatric / Minor Patient Management []  - 0 Isolation Patient Management []  - 0 Hearing / Language / Visual  special needs []  - 0 Assessment of Community assistance (transportation, D/C planning, etc.) []  - 0 Additional assistance / Altered mentation Stengel, Nivek A ([id number]) [id number] Page 3 of 11 []  - 0 Support Surface(s) Assessment (bed, cushion, seat, etc.) INTERVENTIONS - Wound Cleansing / Measurement X- 1 5 Wound Imaging (photographs - any number of wounds) []  -  0 Wound Tracing (instead of photographs) []  - 0 Simple Wound Measurement - one wound X- 2 5 Complex Wound Measurement - multiple wounds []  - 0 Simple Wound Cleansing - one wound X- 2 5 Complex Wound Cleansing - multiple wounds INTERVENTIONS - Wound Dressings X - Small Wound Dressing one or multiple wounds 2 10 []  - 0 Medium Wound Dressing one or multiple wounds []  - 0 Large Wound Dressing one or multiple wounds []  - 0 Application of Medications - injection INTERVENTIONS - Miscellaneous []  - 0 External ear exam []  - 0 Specimen Collection (cultures, biopsies, blood, body fluids, etc.) []  - 0 Specimen(s) / Culture(s) sent or taken to Lab for analysis []  - 0 Patient Transfer (multiple staff / Nurse, adult / Similar devices) []  - 0 Simple Staple / Suture removal (25 or less) []  - 0 Complex Staple / Suture removal (26 or more) []  - 0 Hypo / Hyperglycemic Management (close monitor of Blood Glucose) X- 1 15 Ankle / Brachial Index (ABI) - do not check if billed separately Has the patient been seen at the hospital within the last three years: Yes Total Score: 175 Level Of Care: New/Established - Level 5 Electronic Signature(s) Signed: 12/10/2023 5:08:46 PM By: [name] MSN RN CNS WTA Entered By: [name] on 12/10/2023 16:35:45 -------------------------------------------------------------------------------- Encounter Discharge Information Details Patient Name: Date of Service: TO Selinda Orion A. 12/10/2023 2:00 PM Medical Record Number: [id number] Patient Account Number:  [id number] Date of Birth/Sex: Treating RN: Apr 23, 1981 (42 y.o. Male) [name] Primary Care Roxsana Riding: Gillermo Murdoch Other Clinician: Referring Sahan Pen: Treating Fronnie Urton/Extender: Azzie Glatter in Treatment: 0 Encounter Discharge Information Items Discharge Condition: Stable Ambulatory Status: Ambulatory Discharge Destination: Home Transportation: Private Auto Accompanied By: self Schedule Follow-up Appointment: KORBY, PELUSO A ([id number]) [id number] Page 4 of 11 Clinical Summary of Care: Electronic Signature(s) Signed: 12/10/2023 4:38:29 PM By: [name] MSN RN CNS WTA Entered By: [name] on 12/10/2023 16:38:29 -------------------------------------------------------------------------------- Lower Extremity Assessment Details Patient Name: Date of Service: TO Selinda Orion A. 12/10/2023 2:00 PM Medical Record Number: [id number] Patient Account Number: [id number] Date of Birth/Sex: Treating RN: 09/14/1981 (42 y.o. Male) [name] Primary Care Lizzie Cokley: Gillermo Murdoch Other Clinician: Referring Fenna Semel: Treating Vona Whiters/Extender: Jethro Bolus, Ahmed Weeks in Treatment: 0 Edema Assessment Assessed: [Left: No] [Right: Yes] [Left: Edema] [Right: :] Calf Left: Right: Point of Measurement: 30 cm From Medial Instep 34 cm Ankle Left: Right: Point of Measurement: 12 cm From Medial Instep 24 cm Knee To Floor Left: Right: From Medial Instep 35 cm Vascular Assessment Pulses: Dorsalis Pedis Palpable: [Right:Yes] Doppler Audible: [Right:Yes] Extremity colors, hair growth, and conditions: Extremity Color: [Right:Red] Temperature of Extremity: [Right:Warm] Capillary Refill: [Right:> 3 seconds] Dependent Rubor: [Right:No] Blanched when Elevated: [Right:No] Lipodermatosclerosis: [Right:No] Blood Pressure: Brachial: [Right:161] Ankle: [Right:Dorsalis Pedis: 210 1.30] Toe Nail Assessment Left:  Right: Thick: Yes Discolored: Yes Deformed: Yes Improper Length and Hygiene: Yes Electronic Signature(s) REDA, MANZOOR A ([id number]) [id number] Page 5 of 11 Signed: 12/10/2023 5:08:46 PM By: [name] MSN RN CNS WTA Entered By: [name] on 12/10/2023 15:09:56 -------------------------------------------------------------------------------- Multi Wound Chart Details Patient Name: Date of Service: TO Selinda Orion A. 12/10/2023 2:00 PM Medical Record Number: [id number] Patient Account Number: [id number] Date of Birth/Sex: Treating RN: May 10, 1981 (42 y.o. Male) [name] Primary Care Prisha Hiley: Gillermo Murdoch Other Clinician: Referring Kairee Kozma: Treating Lenis Nettleton/Extender: Jethro Bolus, Ahmed Weeks in Treatment: 0 Vital Signs Height(in): 70 Pulse(bpm): 68 Weight(lbs): 145 Blood Pressure(mmHg): 161/80 Body  Mass Index(BMI): 20.8 Temperature(F): 97.9 Respiratory Rate(breaths/min): 18 [1:Photos:] [N/A:N/A] Right, Lateral Lower Leg Right, Anterior T Second oe N/A Wound Location: Gradually Appeared Gradually Appeared N/A Wounding Event: Diabetic Wound/Ulcer of the Lower Diabetic Wound/Ulcer of the Lower N/A Primary Etiology: Extremity Extremity Congestive Heart Failure, Congestive Heart Failure, N/A Comorbid History: Hypertension, Type II Diabetes, End Hypertension, Type II Diabetes, End Stage Renal Disease, Neuropathy Stage Renal Disease, Neuropathy 07/29/2023 07/29/2023 N/A Date Acquired: 0 0 N/A Weeks of Treatment: Open Open N/A Wound Status: No No N/A Wound Recurrence: 0.8x2.2x0.1 1.2x1.2x0.1 N/A Measurements L x W x D (cm) 1.382 1.131 N/A A (cm) : rea 0.138 0.113 N/A Volume (cm) : Grade 2 Grade 2 N/A Classification: Medium Medium N/A Exudate A mount: Serosanguineous Serosanguineous N/A Exudate Type: red, brown red, brown N/A Exudate Color: Medium (34-66%) Medium (34-66%) N/A Granulation A mount: Red Red  N/A Granulation Quality: None Present (0%) N/A N/A Necrotic A mount: Fat Layer (Subcutaneous Tissue): Yes Fat Layer (Subcutaneous Tissue): Yes N/A Exposed Structures: Fascia: No Fascia: No Tendon: No Tendon: No Muscle: No Muscle: No Joint: No Joint: No Bone: No Bone: No Treatment Notes Electronic Signature(s) Signed: 12/10/2023 5:08:46 PM By: [name] MSN RN CNS Doroteo Glassman A ([id number]) [id number] Page 6 of 11 Entered By: [name] on 12/10/2023 15:20:00 -------------------------------------------------------------------------------- Multi-Disciplinary Care Plan Details Patient Name: Date of Service: TO Benedetto Coons 12/10/2023 2:00 PM Medical Record Number: [id number] Patient Account Number: [id number] Date of Birth/Sex: Treating RN: 11-29-1981 (42 y.o. Male) [name] Primary Care Aizza Santiago: Gillermo Murdoch Other Clinician: Referring Akaash Vandewater: Treating Diem Pagnotta/Extender: Jethro Bolus, Ahmed Weeks in Treatment: 0 Active Inactive Orientation to the Wound Care Program Nursing Diagnoses: Knowledge deficit related to the wound healing center program Goals: Patient/caregiver will verbalize understanding of the Wound Healing Center Program Date Initiated: 12/10/2023 Target Resolution Date: 12/24/2023 Goal Status: Active Interventions: Provide education on orientation to the wound center Notes: Wound/Skin Impairment Nursing Diagnoses: Impaired tissue integrity Knowledge deficit related to ulceration/compromised skin integrity Goals: Patient/caregiver will verbalize understanding of skin care regimen Date Initiated: 12/10/2023 Target Resolution Date: 12/29/2023 Goal Status: Active Ulcer/skin breakdown will have a volume reduction of 30% by week 4 Date Initiated: 12/10/2023 Target Resolution Date: 01/10/2024 Goal Status: Active Ulcer/skin breakdown will have a volume reduction of 50% by week 8 Date Initiated:  12/10/2023 Target Resolution Date: 02/10/2024 Goal Status: Active Ulcer/skin breakdown will have a volume reduction of 80% by week 12 Date Initiated: 12/10/2023 Target Resolution Date: 03/09/2024 Goal Status: Active Interventions: Assess patient/caregiver ability to obtain necessary supplies Assess patient/caregiver ability to perform ulcer/skin care regimen upon admission and as needed Assess ulceration(s) every visit Provide education on ulcer and skin care Treatment Activities: Skin care regimen initiated : 12/10/2023 Notes: Electronic Signature(s) Signed: 12/10/2023 4:37:09 PM By: [name] MSN RN CNS Doroteo Glassman A ([id number]) PM By: [name] MSN RN CNS WTA [id number].pdf Page 7 of 11 Signed: 12/10/2023 4:37:09 Previous Signature: 12/10/2023 4:37:05 PM Version By: [name] MSN RN CNS WTA Entered By: [name] on 12/10/2023 16:37:09 -------------------------------------------------------------------------------- Pain Assessment Details Patient Name: Date of Service: TO Selinda Orion A. 12/10/2023 2:00 PM Medical Record Number: [id number] Patient Account Number: [id number] Date of Birth/Sex: Treating RN: 1981/10/20 (42 y.o. Male) [name] Primary Care Fateh Kindle: Gillermo Murdoch Other Clinician: Referring Delynda Sepulveda: Treating Reyhan Moronta/Extender: Jethro Bolus, Ahmed Weeks in Treatment: 0 Active Problems Location of Pain Severity and Description of Pain Patient Has Paino No Site Locations Pain Management and Medication  Current Pain Management: Electronic Signature(s) Signed: 12/10/2023 5:08:46 PM By: [name] MSN RN CNS WTA Entered By: [name] on 12/10/2023 14:41:23 -------------------------------------------------------------------------------- Patient/Caregiver Education Details Patient Name: Date of Service: TO Benedetto Coons 12/12/2024andnbsp2:00 PM Medical Record Number: [id number] Patient Account Number:  [id number] ASIE, GRONAU A ([id number]) [id number] Page 8 of 11 Date of Birth/Gender: Treating RN: [birth year] (42 y.o. Male) [name] Primary Care Physician: Gillermo Murdoch Other Clinician: Referring Physician: Treating Physician/Extender: Azzie Glatter in Treatment: 0 Education Assessment Education Provided To: Patient Education Topics Provided Welcome [facility] Packet: o Handouts: Welcome T [facility] o Methods: Explain/Verbal Responses: State content correctly Wound/Skin Impairment: Handouts: Caring for Your Ulcer Methods: Explain/Verbal Responses: State content correctly Electronic Signature(s) Signed: 12/10/2023 5:08:46 PM By: [name] MSN RN CNS WTA Entered By: [name] on 12/10/2023 16:37:26 -------------------------------------------------------------------------------- Wound Assessment Details Patient Name: Date of Service: TO Selinda Orion A. 12/10/2023 2:00 PM Medical Record Number: [id number] Patient Account Number: [id number] Date of Birth/Sex: Treating RN: 1981-11-24 (42 y.o. Male) [name] Primary Care Elizer Bostic: Gillermo Murdoch Other Clinician: Referring Maci Eickholt: Treating Aydan Phoenix/Extender: Jethro Bolus, Ahmed Weeks in Treatment: 0 Wound Status Wound Number: 1 Primary Diabetic Wound/Ulcer of the Lower Extremity Etiology: Wound Location: Right, Lateral Lower Leg Wound Open Wounding Event: Gradually Appeared Status: Date Acquired: 07/29/2023 Comorbid Congestive Heart Failure, Hypertension, Type II Diabetes, End Weeks Of Treatment: 0 History: Stage Renal Disease, Neuropathy Clustered Wound: No Photos Wound Measurements KYUS, ZERKEL A ([id number]) Length: (cm) 0.8 Width: (cm) 2.2 Depth: (cm) 0.1 Area: (cm) 1.382 Volume: (cm) 0.138 [id number] Page 9 of 11 % Reduction in Area: % Reduction in Volume: Wound  Description Classification: Grade 2 Exudate Amount: Medium Exudate Type: Serosanguineous Exudate Color: red, brown Foul Odor After Cleansing: No Slough/Fibrino No Wound Bed Granulation Amount: Medium (34-66%) Exposed Structure Granulation Quality: Red Fascia Exposed: No Necrotic Amount: None Present (0%) Fat Layer (Subcutaneous Tissue) Exposed: Yes Tendon Exposed: No Muscle Exposed: No Joint Exposed: No Bone Exposed: No Treatment Notes Wound #1 (Lower Leg) Wound Laterality: Right, Lateral Cleanser Soap and Water Discharge Instruction: Gently cleanse wound with antibacterial soap, rinse and pat dry prior to dressing wounds Peri-Wound Care Topical Primary Dressing Hydrofera Blue Ready Transfer Foam, 2.5x2.5 (in/in) Discharge Instruction: Apply Hydrofera Blue Ready to wound bed as directed Secondary Dressing (BORDER) Zetuvit Plus SILICONE BORDER Dressing 4x4 (in/in) Discharge Instruction: Please do not put silicone bordered dressings under wraps. Use non-bordered dressing only. Secured With Tubigrip Size D, 3x10 (in/yd) Compression Wrap Compression Stockings Add-Ons Electronic Signature(s) Signed: 12/10/2023 5:08:46 PM By: [name] MSN RN CNS WTA Entered By: [name] on 12/10/2023 15:03:59 -------------------------------------------------------------------------------- Wound Assessment Details Patient Name: Date of Service: TO Selinda Orion A. 12/10/2023 2:00 PM Medical Record Number: [id number] Patient Account Number: [id number] Date of Birth/Sex: Treating RN: 1981-05-05 (42 y.o. Male) [name] Primary Care Judit Awad: Gillermo Murdoch Other Clinician: Referring Ysela Hettinger: Treating Griffyn Kucinski/Extender: Jethro Bolus, Ahmed Weeks in Treatment: 0 COTE, SANDOR A ([id number]) [id number] Page 10 of 11 Wound Status Wound Number: 2 Primary Diabetic Wound/Ulcer of the Lower Extremity Etiology: Wound Location: Right, Anterior T  Second oe Wound Open Wounding Event: Gradually Appeared Status: Date Acquired: 07/29/2023 Comorbid Congestive Heart Failure, Hypertension, Type II Diabetes, End Weeks Of Treatment: 0 History: Stage Renal Disease, Neuropathy Clustered Wound: No Photos Wound Measurements Length: (cm) 1.2 Width: (cm) 1.2 Depth: (cm) 0.1 Area: (cm) 1.131 Volume: (cm) 0.113 % Reduction in Area: %  Reduction in Volume: Tunneling: No Undermining: No Wound Description Classification: Grade 2 Exudate Amount: Medium Exudate Type: Serosanguineous Exudate Color: red, brown Foul Odor After Cleansing: No Slough/Fibrino No Wound Bed Granulation Amount: Medium (34-66%) Exposed Structure Granulation Quality: Red Fascia Exposed: No Fat Layer (Subcutaneous Tissue) Exposed: Yes Tendon Exposed: No Muscle Exposed: No Joint Exposed: No Bone Exposed: No Treatment Notes Wound #2 (Toe Second) Wound Laterality: Right, Anterior Cleanser Soap and Water Discharge Instruction: Gently cleanse wound with antibacterial soap, rinse and pat dry prior to dressing wounds Peri-Wound Care Topical Primary Dressing Hydrofera Blue Ready Transfer Foam, 2.5x2.5 (in/in) Discharge Instruction: Apply Hydrofera Blue Ready to wound bed as directed Secondary Dressing Coverlet Latex-Free Fabric Adhesive Dressings Discharge Instruction: Knuckle Secured With Compression Wrap Compression Stockings Add-Ons Electronic Signature(s) Signed: 12/10/2023 5:08:46 PM By: [name] MSN RN CNS Doroteo Glassman A ([id number]) [id number] Page 11 of 11 Entered By: [name] on 12/10/2023 15:05:19 -------------------------------------------------------------------------------- Vitals Details Patient Name: Date of Service: TO Selinda Orion A. 12/10/2023 2:00 PM Medical Record Number: [id number] Patient Account Number: [id number] Date of Birth/Sex: Treating RN: 09-07-1981 (42 y.o. Male) [name] Primary  Care Travarius Lange: Gillermo Murdoch Other Clinician: Referring Kaito Schulenburg: Treating Nicki Furlan/Extender: Jethro Bolus, Ahmed Weeks in Treatment: 0 Vital Signs Time Taken: 14:41 Temperature (F): 97.9 Height (in): 70 Pulse (bpm): 68 Source: Stated Respiratory Rate (breaths/min): 18 Weight (lbs): 145 Blood Pressure (mmHg): 161/80 Source: Stated Reference Range: 80 - 120 mg / dl Body Mass Index (BMI): 20.8 Electronic Signature(s) Signed: 12/10/2023 5:08:46 PM By: [name] MSN RN CNS WTA Entered By: [name] on 12/10/2023 14:44:22

## 2023-12-11 ENCOUNTER — Other Ambulatory Visit (INDEPENDENT_AMBULATORY_CARE_PROVIDER_SITE_OTHER): Payer: Self-pay | Admitting: Physician Assistant

## 2023-12-11 DIAGNOSIS — E11621 Type 2 diabetes mellitus with foot ulcer: Secondary | ICD-10-CM

## 2023-12-11 DIAGNOSIS — L97812 Non-pressure chronic ulcer of other part of right lower leg with fat layer exposed: Secondary | ICD-10-CM

## 2023-12-11 NOTE — Progress Notes (Signed)
Mark [name], Mark [name] ([id number]) [phone] [id number] Page 1 of 5 Visit Report for 12/10/2023 Abuse Risk Screen Details Patient Name: Date of Service: [name]. 12/10/2023 2:00 PM Medical Record Number: [id number] Patient Account Number: [id number] Date of Birth/Sex: Treating RN: [birth year] (42 y.o. Male) [name] Primary Care Kamran Coker: Gillermo Murdoch Other Clinician: Referring Alexea Blase: Treating Pieper Kasik/Extender: Jethro Bolus, Ahmed Weeks in Treatment: 0 Abuse Risk Screen Items Answer Electronic Signature(s) Signed: 12/10/2023 5:08:46 PM By: [name] MSN RN CNS WTA Entered By: [name] on 12/10/2023 14:48:17 -------------------------------------------------------------------------------- Activities of Daily Living Details Patient Name: Date of Service: [name]. 12/10/2023 2:00 PM Medical Record Number: [id number] Patient Account Number: [id number] Date of Birth/Sex: Treating RN: 1981/07/19 (42 y.o. Male) [name] Primary Care Lynett Brasil: Gillermo Murdoch Other Clinician: Referring Yazmine Sorey: Treating Bellamy Rubey/Extender: Jethro Bolus, Ahmed Weeks in Treatment: 0 Activities of Daily Living Items Answer Activities of Daily Living (Please select one for each item) Drive Automobile Completely Able T Medications ake Completely Able Use T elephone Completely Able Care for Appearance Completely Able Use T oilet Completely Able Bath / Shower Completely Able Dress Self Completely Able Feed Self Completely Able Walk Completely Able Get In / Out Bed Completely Able Housework Completely Able Prepare Meals Completely Able Handle Money Completely Able Shop for Self Completely Mark [name], Mark [name] ([id number]) [phone] [id number] Page 2 of 5 Electronic Signature(s) Signed: 12/10/2023 5:08:46 PM By: [name] MSN RN CNS WTA Entered By: [name] on 12/10/2023  14:48:34 -------------------------------------------------------------------------------- Education Screening Details Patient Name: Date of Service: [name]. 12/10/2023 2:00 PM Medical Record Number: [id number] Patient Account Number: [id number] Date of Birth/Sex: Treating RN: Jun 06, 1981 (42 y.o. Male) [name] Primary Care Cerenity Goshorn: Gillermo Murdoch Other Clinician: Referring Scottlyn Mchaney: Treating Elza Sortor/Extender: Azzie Glatter in Treatment: 0 Learning Preferences/Education Level/Primary Language Learning Preference: Explanation, Demonstration Preferred Language: English Cognitive Barrier Language Barrier: No Translator Needed: No Memory Deficit: No Emotional Barrier: No Cultural/Religious Beliefs Affecting Medical Care: No Physical Barrier Impaired Vision: No Impaired Hearing: No Decreased Hand dexterity: No Knowledge/Comprehension Knowledge Level: High Comprehension Level: High Ability to understand written instructions: High Ability to understand verbal instructions: High Motivation Anxiety Level: Calm Cooperation: Cooperative Education Importance: Acknowledges Need Interest in Health Problems: Asks Questions Perception: Coherent Willingness to Engage in Self-Management High Activities: Readiness to Engage in Self-Management High Activities: Electronic Signature(s) Signed: 12/10/2023 5:08:46 PM By: [name] MSN RN CNS WTA Entered By: [name] on 12/10/2023 14:48:57 Mark [name] ([id number]) [id number] Page 3 of 5 -------------------------------------------------------------------------------- Fall Risk Assessment Details Patient Name: Date of Service: [name]. 12/10/2023 2:00 PM Medical Record Number: [id number] Patient Account Number: [id number] Date of Birth/Sex: Treating RN: 09/17/1981 (42 y.o. Male) [name] Primary Care Takota Cahalan: Gillermo Murdoch Other  Clinician: Referring Keyla Milone: Treating Arena Lindahl/Extender: Jethro Bolus, Ahmed Weeks in Treatment: 0 Fall Risk Assessment Items Have you had 2 or more falls in the last 12 monthso 0 Yes Have you had any fall that resulted in injury in the last 12 monthso 0 No FALLS RISK SCREEN History of falling - immediate or within 3 months 0 No Secondary diagnosis (Do you have 2 or more medical diagnoseso) 0 No Ambulatory aid None/bed rest/wheelchair/nurse 0 No Crutches/cane/walker 0 No Furniture 0 No Intravenous therapy Access/Saline/Heparin Lock 0 No Gait/Transferring Normal/ bed rest/ wheelchair 0 No Weak (short steps with or without shuffle, stooped but able to lift head while walking, may seek 0  No support from furniture) Impaired (short steps with shuffle, may have difficulty arising from chair, head down, impaired 0 No balance) Mental Status Oriented to own ability 0 Yes Electronic Signature(s) Signed: 12/10/2023 5:08:46 PM By: [name] MSN RN CNS WTA Entered By: [name] on 12/10/2023 14:49:29 -------------------------------------------------------------------------------- Foot Assessment Details Patient Name: Date of Service: [name]. 12/10/2023 2:00 PM Medical Record Number: [id number] Patient Account Number: [id number] Date of Birth/Sex: Treating RN: Apr 14, 1981 (42 y.o. Male) [name] Primary Care Kaylina Cahue: Gillermo Murdoch Other Clinician: Referring Daphnie Venturini: Treating Laguana Desautel/Extender: Jethro Bolus, Ahmed Weeks in Treatment: 0 Foot Assessment Items Site Locations Mark [name] ([id number]) [id number] Page 4 of 5 + = Sensation present, - = Sensation absent, C = Callus, U = Ulcer R = Redness, W = Warmth, M = Maceration, PU = Pre-ulcerative lesion F = Fissure, S = Swelling, D = Dryness Assessment Right: Left: Other Deformity: No No Prior Foot Ulcer: No No Prior Amputation: No No Charcot Joint: No  No Ambulatory Status: Ambulatory Without Help Gait: Steady Electronic Signature(s) Signed: 12/10/2023 5:08:46 PM By: [name] MSN RN CNS WTA Entered By: [name] on 12/10/2023 14:53:13 -------------------------------------------------------------------------------- Nutrition Risk Screening Details Patient Name: Date of Service: [name]. 12/10/2023 2:00 PM Medical Record Number: [id number] Patient Account Number: [id number] Date of Birth/Sex: Treating RN: 08-05-1981 (42 y.o. Male) [name] Primary Care Lakresha Stifter: Gillermo Murdoch Other Clinician: Referring Leighann Amadon: Treating Tangie Stay/Extender: Jethro Bolus, Ahmed Weeks in Treatment: 0 Height (in): 70 Weight (lbs): 145 Body Mass Index (BMI): 20.8 Nutrition Risk Screening Items Score Screening NUTRITION RISK SCREEN: I have an illness or condition that made me change the kind and/or amount of food I eat 0 No I eat fewer than two meals per day 0 No I eat few fruits and vegetables, or milk products 0 No I have three or more drinks of beer, liquor or wine almost every day 0 No I have tooth or mouth problems that make it hard for me to eat 0 No I don't always have enough money to buy the food I need 0 No Mark [name], Mark [name] ([id number]) [id number] Page 5 of 5 I eat alone most of the time 0 No I take three or more different prescribed or over-the-counter drugs [name] day 1 Yes Without wanting to, I have lost or gained 10 pounds in the last six months 0 No I am not always physically able to shop, cook and/or feed myself 0 No Nutrition Protocols Good Risk Protocol 0 No interventions needed Moderate Risk Protocol High Risk Proctocol Risk Level: Good Risk Score: 1 Electronic Signature(s) Signed: 12/10/2023 5:08:46 PM By: [name] MSN RN CNS WTA Entered By: [name] on 12/10/2023 14:50:16

## 2023-12-11 NOTE — Progress Notes (Signed)
Mark, ROBLEDO [name] ([id number]) [id number] Page 1 of 9 Visit Report for 12/10/2023 Chief Complaint Document Details Patient Name: Date of Service: [name]. 12/10/2023 2:00 PM Medical Record Number: [id number] Patient Account Number: [id number] Date of Birth/Sex: Treating RN: [birth year] (42 y.o. Male) Mark [name] Primary Care Provider: Gillermo Murdoch Other Clinician: Referring Provider: Treating Provider/Extender: Azzie Glatter in Treatment: 0 Information Obtained from: Patient Chief Complaint Right leg and right foot ulcers Electronic Signature(s) Signed: 12/10/2023 3:16:51 PM By: Allen Derry PA-C Entered By: Allen Derry on 12/10/2023 15:16:51 -------------------------------------------------------------------------------- HPI Details Patient Name: Date of Service: [name]. 12/10/2023 2:00 PM Medical Record Number: [id number] Patient Account Number: [id number] Date of Birth/Sex: Treating RN: 1981/11/03 (42 y.o. Male) Mark [name] Primary Care Provider: Gillermo Murdoch Other Clinician: Referring Provider: Treating Provider/Extender: Jethro Bolus, Ahmed Weeks in Treatment: 0 History of Present Illness Chronic/Inactive Conditions Condition 1: 12-10-2023 patient's ABIs here in the clinic were 1.3 on the right and noncompressible on the descending for formal arterial studies in order to evaluate the situation here. HPI Description: 12-10-2023 upon evaluation today patient appears to be doing well currently in regard to his wounds which he is seen today for initial evaluation here in the clinic with regard to. He has [name] wound on his right leg as well as his right second toe on the dorsal aspect. With that being said he tells me that these wounds have been present since summer around July or August. This is quite [name] long time considering how small they appear. He does have diabetes. He is on medication for this  regularly. He also has end-stage renal disease is on dialysis Monday Wednesdays and Fridays. With that being said I think all this is contributing to some of the issues she is having with swelling which I think is contributing to his healing as well. There is also some question of his arterial flow unfortunately he was noncompressible on evaluation here in the clinic today and subsequently I think that he is going to require an arterial study with ABI and TBI to be done promptly in order to evaluate and see where things stand here. He voiced understanding. Patient's most recent hemoglobin A1c was 6.4 and that was on 11-06-2023. He also has [name] history of congestive heart failure, end-stage renal disease for which he is on dialysis, and cataract issues which she is actually having surgery on next Thursday. Electronic Signature(s) [name], WESTMEYER [name] ([id number]) [id number] Page 2 of 9 Signed: 12/10/2023 3:27:37 PM By: Allen Derry PA-C Entered By: Allen Derry on 12/10/2023 15:27:37 -------------------------------------------------------------------------------- Physical Exam Details Patient Name: Date of Service: [name]. 12/10/2023 2:00 PM Medical Record Number: [id number] Patient Account Number: [id number] Date of Birth/Sex: Treating RN: 10/30/1981 (42 y.o. Male) Mark [name] Primary Care Provider: Gillermo Murdoch Other Clinician: Referring Provider: Treating Provider/Extender: Jethro Bolus, Ahmed Weeks in Treatment: 0 Constitutional patient is hypertensive.. pulse regular and within target range for patient.Marland Kitchen respirations regular, non-labored and within target range for patient.Marland Kitchen temperature within target range for patient.. Well-nourished and well-hydrated in no acute distress. Eyes conjunctiva clear no eyelid edema noted. pupils equal round and reactive to light and accommodation. Ears, Nose, Mouth, and Throat no gross abnormality of ear  auricles or external auditory canals. normal hearing noted during conversation. mucus membranes moist. Respiratory normal breathing without difficulty. Cardiovascular 2+ dorsalis pedis/posterior tibialis pulses. 2+ pitting edema of the bilateral lower extremities Unfortunately patient's capillary refill was around  6 seconds and this indicates that he may have some vascular compromise I discussed that with him today as well.. Musculoskeletal normal gait and posture. no significant deformity or arthritic changes, no loss or range of motion, no clubbing. Psychiatric this patient is able to make decisions and demonstrates good insight into disease process. Alert and Oriented x 3. pleasant and cooperative. Notes Upon inspection patient's wound bed actually showed signs of good granulation epithelization at this point. Fortunately I do not see any signs of active infection at this time which is good news and in general I do believe that we are making headway towards some closure with that being said I do not see any signs of worsening and I believe though that he does need to be seen by vascular as soon as possible. I think the sooner we get this done the better. Electronic Signature(s) Signed: 12/10/2023 3:28:47 PM By: Allen Derry PA-C Entered By: Allen Derry on 12/10/2023 15:28:47 -------------------------------------------------------------------------------- Physician Orders Details Patient Name: Date of Service: [name]. 12/10/2023 2:00 PM Medical Record Number: [id number] Patient Account Number: [id number] [name], Mark ([id number]) [id number] Page 3 of 9 Date of Birth/Sex: Treating RN: May 03, 1981 (42 y.o. Male) Mark [name] Primary Care Provider: Other Clinician: Gillermo Murdoch Referring Provider: Treating Provider/Extender: Azzie Glatter in Treatment: 0 The following information was scribed by: Mark [name] The  information was scribed for: Allen Derry Verbal / Phone Orders: No Diagnosis Coding ICD-10 Coding Code Description E11.622 Type 2 diabetes mellitus with other skin ulcer L97.812 Non-pressure chronic ulcer of other part of right lower leg with fat layer exposed E11.621 Type 2 diabetes mellitus with foot ulcer L97.512 Non-pressure chronic ulcer of other part of right foot with fat layer exposed I50.42 Chronic combined systolic (congestive) and diastolic (congestive) heart failure N18.6 End stage renal disease Z99.2 Dependence on renal dialysis Follow-up Appointments Return Appointment in 2 weeks. Bathing/ [employer] wounds with antibacterial soap and water. Edema Control - Orders / Instructions Tubigrip single layer applied. - D Bilateral Wound Treatment Wound #1 - Lower Leg Wound Laterality: Right, Lateral Cleanser: Soap and Water 3 x Per Week/30 Days Discharge Instructions: Gently cleanse wound with antibacterial soap, rinse and pat dry prior to dressing wounds Prim Dressing: Hydrofera Blue Ready Transfer Foam, 2.5x2.5 (in/in) 3 x Per Week/30 Days ary Discharge Instructions: Apply Hydrofera Blue Ready to wound bed as directed Secondary Dressing: (BORDER) Zetuvit Plus SILICONE BORDER Dressing 4x4 (in/in) 3 x Per Week/30 Days Discharge Instructions: Please do not put silicone bordered dressings under wraps. Use non-bordered dressing only. Secured With: Tubigrip Size D, 3x10 (in/yd) 3 x Per Week/30 Days Wound #2 - T Second oe Wound Laterality: Right, Anterior Cleanser: Soap and Water 3 x Per Week/30 Days Discharge Instructions: Gently cleanse wound with antibacterial soap, rinse and pat dry prior to dressing wounds Prim Dressing: Hydrofera Blue Ready Transfer Foam, 2.5x2.5 (in/in) 3 x Per Week/30 Days ary Discharge Instructions: Apply Hydrofera Blue Ready to wound bed as directed Secondary Dressing: Coverlet Latex-Free Fabric Adhesive Dressings 3 x Per Week/30  Days Discharge Instructions: [facility] Ankle Brachial Index (ABI) Electronic Signature(s) Signed: 12/10/2023 5:07:37 PM By: Allen Derry PA-C Signed: 12/10/2023 5:08:46 PM By: Mark Aver MSN RN CNS WTA Previous Signature: 12/10/2023 4:38:12 PM Version By: Allen Derry PA-C Entered By: Mark [name] on 12/10/2023 17:06:11 Leeroy Bock [name] ([id number]) [id number] Page 4 of 9 -------------------------------------------------------------------------------- Problem List Details Patient Name: Date of Service: TO Mark Orion  [name]. 12/10/2023 2:00 PM Medical Record Number: [id number] Patient Account Number: [id number] Date of Birth/Sex: Treating RN: [birth year] (42 y.o. Male) Mark [name] Primary Care Provider: Gillermo Murdoch Other Clinician: Referring Provider: Treating Provider/Extender: Jethro Bolus, Ahmed Weeks in Treatment: 0 Active Problems ICD-10 Encounter Code Description Active Date MDM Diagnosis E11.622 Type 2 diabetes mellitus with other skin ulcer 12/10/2023 No Yes L97.812 Non-pressure chronic ulcer of other part of right lower leg with fat layer 12/10/2023 No Yes exposed E11.621 Type 2 diabetes mellitus with foot ulcer 12/10/2023 No Yes L97.512 Non-pressure chronic ulcer of other part of right foot with fat layer exposed 12/10/2023 No Yes I50.42 Chronic combined systolic (congestive) and diastolic (congestive) heart failure 12/10/2023 No Yes N18.6 End stage renal disease 12/10/2023 No Yes Z99.2 Dependence on renal dialysis 12/10/2023 No Yes Inactive Problems Resolved Problems Electronic Signature(s) Signed: 12/10/2023 3:16:32 PM By: Allen Derry PA-C Entered By: Allen Derry on 12/10/2023 15:16:32 Leeroy Bock [name] ([id number]) [id number] Page 5 of 9 -------------------------------------------------------------------------------- Progress Note Details Patient Name: Date of Service: [name]. 12/10/2023 2:00 PM Medical Record Number: [id number] Patient Account Number: [id number] Date of Birth/Sex: Treating RN: [birth year] (42 y.o. Male) Mark [name] Primary Care Provider: Gillermo Murdoch Other Clinician: Referring Provider: Treating Provider/Extender: Azzie Glatter in Treatment: 0 Subjective Chief Complaint Information obtained from Patient Right leg and right foot ulcers History of Present Illness (HPI) Chronic/Inactive Condition: 12-10-2023 patient's ABIs here in the clinic were 1.3 on the right and noncompressible on the descending for formal arterial studies in order to evaluate the situation here. 12-10-2023 upon evaluation today patient appears to be doing well currently in regard to his wounds which he is seen today for initial evaluation here in the clinic with regard to. He has [name] wound on his right leg as well as his right second toe on the dorsal aspect. With that being said he tells me that these wounds have been present since summer around July or August. This is quite [name] long time considering how small they appear. He does have diabetes. He is on medication for this regularly. He also has end-stage renal disease is on dialysis Monday Wednesdays and Fridays. With that being said I think all this is contributing to some of the issues she is having with swelling which I think is contributing to his healing as well. There is also some question of his arterial flow unfortunately he was noncompressible on evaluation here in the clinic today and subsequently I think that he is going to require an arterial study with ABI and TBI to be done promptly in order to evaluate and see where things stand here. He voiced understanding. Patient's most recent hemoglobin A1c was 6.4 and that was on 11-06-2023. He also has [name] history of congestive heart failure, end-stage renal disease for which he is on dialysis, and cataract issues which she is actually  having surgery on next Thursday. Patient History Allergies metformin (Severity: Moderate), penicillin (Severity: Moderate) Social History Former smoker, Alcohol Use - Never, Drug Use - No History, Caffeine Use - Rarely. Medical History Cardiovascular Patient has history of Congestive Heart Failure, Hypertension Endocrine Patient has history of Type II Diabetes Genitourinary Patient has history of End Stage Renal Disease Neurologic Patient has history of Neuropathy Patient is treated with Insulin. Review of Systems (ROS) Constitutional Symptoms (General Health) Denies complaints or symptoms of Fatigue, Fever, Chills, Marked Weight Change. Eyes Denies complaints or symptoms of Dry Eyes, Vision Changes, Glasses /  Contacts. Ear/Nose/Mouth/Throat Denies complaints or symptoms of Difficult clearing ears, Sinusitis. Respiratory Denies complaints or symptoms of Chronic or frequent coughs, Shortness of Breath. Gastrointestinal Denies complaints or symptoms of Frequent diarrhea, Nausea, Vomiting. Genitourinary Complains or has symptoms of Kidney failure/ Dialysis - MWF HD. Immunological Denies complaints or symptoms of Hives, Itching. Integumentary (Skin) Denies complaints or symptoms of Wounds, Bleeding or bruising tendency, Breakdown, Swelling. Psychiatric Denies complaints or symptoms of Anxiety, Claustrophobia. MOSI, MONTREUIL [name] ([id number]) [id number] Page 6 of 9 Objective Constitutional patient is hypertensive.. pulse regular and within target range for patient.Marland Kitchen respirations regular, non-labored and within target range for patient.Marland Kitchen temperature within target range for patient.. Well-nourished and well-hydrated in no acute distress. Vitals Time Taken: 2:41 PM, Height: 70 in, Source: Stated, Weight: 145 lbs, Source: Stated, BMI: 20.8, Temperature: 97.9 F, Pulse: 68 bpm, Respiratory Rate: 18 breaths/min, Blood Pressure: 161/80 mmHg. Eyes conjunctiva  clear no eyelid edema noted. pupils equal round and reactive to light and accommodation. Ears, Nose, Mouth, and Throat no gross abnormality of ear auricles or external auditory canals. normal hearing noted during conversation. mucus membranes moist. Respiratory normal breathing without difficulty. Cardiovascular 2+ dorsalis pedis/posterior tibialis pulses. 2+ pitting edema of the bilateral lower extremities Unfortunately patient's capillary refill was around 6 seconds and this indicates that he may have some vascular compromise I discussed that with him today as well.. Musculoskeletal normal gait and posture. no significant deformity or arthritic changes, no loss or range of motion, no clubbing. Psychiatric this patient is able to make decisions and demonstrates good insight into disease process. Alert and Oriented x 3. pleasant and cooperative. General Notes: Upon inspection patient's wound bed actually showed signs of good granulation epithelization at this point. Fortunately I do not see any signs of active infection at this time which is good news and in general I do believe that we are making headway towards some closure with that being said I do not see any signs of worsening and I believe though that he does need to be seen by vascular as soon as possible. I think the sooner we get this done the better. Integumentary (Hair, Skin) Wound #1 status is Open. Original cause of wound was Gradually Appeared. The date acquired was: 07/29/2023. The wound is located on the Right,Lateral Lower Leg. The wound measures 0.8cm length x 2.2cm width x 0.1cm depth; 1.382cm^2 area and 0.138cm^3 volume. There is Fat Layer (Subcutaneous Tissue) exposed. There is [name] medium amount of serosanguineous drainage noted. There is medium (34-66%) red granulation within the wound bed. There is no necrotic tissue within the wound bed. Wound #2 status is Open. Original cause of wound was Gradually Appeared. The date  acquired was: 07/29/2023. The wound is located on the Right,Anterior Toe Second. The wound measures 1.2cm length x 1.2cm width x 0.1cm depth; 1.131cm^2 area and 0.113cm^3 volume. There is Fat Layer (Subcutaneous Tissue) exposed. There is no tunneling or undermining noted. There is [name] medium amount of serosanguineous drainage noted. There is medium (34-66%) red granulation within the wound bed. 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 Bathing/ Shower/ Hygiene: Wash wounds with antibacterial soap  and water. Edema Control - Orders / Instructions: Tubigrip single layer applied. - D Bilateral WOUND #1: - Lower Leg Wound Laterality: Right, Lateral Cleanser: Soap and Water 3 x Per Week/30 Days Discharge Instructions: Gently cleanse wound with antibacterial soap, rinse and pat dry prior to dressing wounds Prim Dressing: Hydrofera Blue Ready Transfer Foam, 2.5x2.5 (in/in) 3 x Per Week/30 Days ary Discharge Instructions: Apply Hydrofera Blue Ready to wound bed as directed Secondary Dressing: (BORDER) Zetuvit Plus SILICONE BORDER Dressing 4x4 (in/in) 3 x Per Week/30 Days Discharge Instructions: Please do not put silicone bordered dressings under wraps. Use non-bordered dressing only. WOUND #2: - T Second Wound Laterality: Right, Anterior KHRYSTIAN, SCHMICK [name] ([id number]) [id number] Page 7 of 9 Cleanser: Soap and Water 3 x Per Week/30 Days Discharge Instructions: Gently cleanse wound with antibacterial soap, rinse and pat dry prior to dressing  wounds Prim Dressing: Hydrofera Blue Ready Transfer Foam, 2.5x2.5 (in/in) 3 x Per Week/30 Days ary Discharge Instructions: Apply Hydrofera Blue Ready to wound bed as directed Secondary Dressing: Coverlet Latex-Free Fabric Adhesive Dressings 3 x Per Week/30 Days Discharge Instructions: Knuckle 1. Based on what I am seeing I am going to recommend that we go and have the patient continue to monitor for any signs of infection or worsening. Based on what I am seeing I do believe that we can make some headway here with the [facility]. 2. We can use coverlets to secure on the toe and then Zetuvit on the leg. 3. And also can recommend the patient should continue to elevate his leg, exercise regularly, and take his fluid pills as directed. 4. I am going to send him for formal arterial studies including ABI and TBI. We will see patient back for reevaluation in 1 week here in the clinic. If anything worsens or changes patient will contact our office for additional recommendations. Electronic Signature(s) Signed: 12/10/2023 3:29:29 PM By: Allen Derry PA-C Entered By: Allen Derry on 12/10/2023 15:29:29 -------------------------------------------------------------------------------- ROS/PFSH Details Patient Name: Date of Service: [name]. 12/10/2023 2:00 PM Medical Record Number: [id number] Patient Account Number: [id number] Date of Birth/Sex: Treating RN: 1981/03/25 (42 y.o. Male) Mark [name] Primary Care Provider: Gillermo Murdoch Other Clinician: Referring Provider: Treating Provider/Extender: Jethro Bolus, Ahmed Weeks in Treatment: 0 Constitutional Symptoms (General Health) Complaints and Symptoms: Negative for: Fatigue; Fever; Chills; Marked Weight Change Eyes Complaints and Symptoms: Negative for: Dry Eyes; Vision Changes; Glasses / Contacts Ear/Nose/Mouth/Throat Complaints and Symptoms: Negative for: Difficult clearing ears; Sinusitis Respiratory Complaints and  Symptoms: Negative for: Chronic or frequent coughs; Shortness of Breath Gastrointestinal Complaints and Symptoms: Negative for: Frequent diarrhea; Nausea; Vomiting Genitourinary Complaints and Symptoms: Positive for: Kidney failure/ Dialysis - MWF HD Medical History: Positive for: End Stage Renal Disease CALAB, KATZENMEYER [name] ([id number]) [id number] Page 8 of 9 Immunological Complaints and Symptoms: Negative for: Hives; Itching Integumentary (Skin) Complaints and Symptoms: Negative for: Wounds; Bleeding or bruising tendency; Breakdown; Swelling Psychiatric Complaints and Symptoms: Negative for: Anxiety; Claustrophobia Cardiovascular Medical History: Positive for: Congestive Heart Failure; Hypertension Endocrine Medical History: Positive for: Type II Diabetes Time with diabetes: 15 Treated with: Insulin Neurologic Medical History: Positive for: Neuropathy Oncologic Immunizations Pneumococcal Vaccine: Received Pneumococcal Vaccination: No Implantable Devices None Family and Social History Former smoker; Alcohol Use: Never; Drug Use: No History; Caffeine Use: Rarely Electronic Signature(s) Signed: 12/10/2023 4:38:12 PM By: Allen Derry PA-C Signed: 12/10/2023 5:08:46 PM By: Mark Aver MSN RN CNS WTA Entered By: Mark [name] on 12/10/2023 14:48:14 -------------------------------------------------------------------------------- SuperBill Details  Patient Name: Date of Service: TO Benedetto Coons 12/10/2023 Medical Record Number: [id number] Patient Account Number: [id number] Date of Birth/Sex: Treating RN: April 25, 1981 (42 y.o. Male) Mark [name] Primary Care Provider: Gillermo Murdoch Other Clinician: Referring Provider: Treating Provider/Extender: Jethro Bolus, Ahmed Weeks in Treatment: 0 KINNEY, GUERECA [name] ([id number]) [id number] Page 9 of 9 Diagnosis Coding ICD-10 Codes Code Description E11.622 Type 2  diabetes mellitus with other skin ulcer L97.812 Non-pressure chronic ulcer of other part of right lower leg with fat layer exposed E11.621 Type 2 diabetes mellitus with foot ulcer L97.512 Non-pressure chronic ulcer of other part of right foot with fat layer exposed I50.42 Chronic combined systolic (congestive) and diastolic (congestive) heart failure N18.6 End stage renal disease Z99.2 Dependence on renal dialysis Facility Procedures : CPT4 Code: [id number] Description: 99205 - WOUND CARE VISIT-LEV 5 NEW PT Modifier: Quantity: 1 Physician Procedures : CPT4 Code Description Modifier [id number] - WC PHYS LEVEL 4 - NEW PT ICD-10 Diagnosis Description E11.622 Type 2 diabetes mellitus with other skin ulcer L97.812 Non-pressure chronic ulcer of other part of right lower leg with fat layer exposed  E11.621 Type 2 diabetes mellitus with foot ulcer L97.512 Non-pressure chronic ulcer of other part of right foot with fat layer exposed Quantity: 1 Electronic Signature(s) Signed: 12/10/2023 4:35:51 PM By: Mark Aver MSN RN CNS WTA Signed: 12/10/2023 4:38:12 PM By: Allen Derry PA-C Previous Signature: 12/10/2023 3:29:45 PM Version By: Allen Derry PA-C Entered By: Mark [name] on 12/10/2023 16:35:50

## 2023-12-15 NOTE — Discharge Instructions (Signed)

## 2023-12-16 ENCOUNTER — Encounter: Payer: Self-pay | Admitting: Ophthalmology

## 2023-12-17 ENCOUNTER — Encounter: Payer: Self-pay | Admitting: Ophthalmology

## 2023-12-17 ENCOUNTER — Ambulatory Visit: Payer: Medicare Other | Admitting: Anesthesiology

## 2023-12-17 ENCOUNTER — Other Ambulatory Visit: Payer: Self-pay

## 2023-12-17 ENCOUNTER — Ambulatory Visit
Admission: RE | Admit: 2023-12-17 | Discharge: 2023-12-17 | Disposition: A | Discharge status: Home / Self Care | Payer: Medicare Other | Attending: Ophthalmology | Admitting: Ophthalmology

## 2023-12-17 ENCOUNTER — Encounter
Admission: RE | Disposition: A | Discharge status: Home / Self Care | Payer: Self-pay | Source: Home / Self Care | Attending: Ophthalmology

## 2023-12-17 ENCOUNTER — Other Ambulatory Visit
Admission: RE | Admit: 2023-12-17 | Discharge: 2023-12-17 | Disposition: A | Discharge status: Home / Self Care | Payer: Medicare Other | Attending: Anesthesiology | Admitting: Anesthesiology

## 2023-12-17 DIAGNOSIS — N186 End stage renal disease: Secondary | ICD-10-CM | POA: Diagnosis not present

## 2023-12-17 DIAGNOSIS — I5022 Chronic systolic (congestive) heart failure: Secondary | ICD-10-CM | POA: Insufficient documentation

## 2023-12-17 DIAGNOSIS — E1136 Type 2 diabetes mellitus with diabetic cataract: Secondary | ICD-10-CM | POA: Diagnosis not present

## 2023-12-17 DIAGNOSIS — I132 Hypertensive heart and chronic kidney disease with heart failure and with stage 5 chronic kidney disease, or end stage renal disease: Secondary | ICD-10-CM | POA: Diagnosis not present

## 2023-12-17 DIAGNOSIS — I2510 Atherosclerotic heart disease of native coronary artery without angina pectoris: Secondary | ICD-10-CM | POA: Insufficient documentation

## 2023-12-17 DIAGNOSIS — I252 Old myocardial infarction: Secondary | ICD-10-CM | POA: Insufficient documentation

## 2023-12-17 DIAGNOSIS — E1143 Type 2 diabetes mellitus with diabetic autonomic (poly)neuropathy: Secondary | ICD-10-CM | POA: Diagnosis not present

## 2023-12-17 DIAGNOSIS — E11311 Type 2 diabetes mellitus with unspecified diabetic retinopathy with macular edema: Secondary | ICD-10-CM | POA: Diagnosis not present

## 2023-12-17 DIAGNOSIS — E1122 Type 2 diabetes mellitus with diabetic chronic kidney disease: Secondary | ICD-10-CM | POA: Diagnosis not present

## 2023-12-17 DIAGNOSIS — K219 Gastro-esophageal reflux disease without esophagitis: Secondary | ICD-10-CM | POA: Diagnosis not present

## 2023-12-17 DIAGNOSIS — H2512 Age-related nuclear cataract, left eye: Secondary | ICD-10-CM | POA: Insufficient documentation

## 2023-12-17 DIAGNOSIS — Z794 Long term (current) use of insulin: Secondary | ICD-10-CM | POA: Insufficient documentation

## 2023-12-17 DIAGNOSIS — Z87891 Personal history of nicotine dependence: Secondary | ICD-10-CM | POA: Diagnosis not present

## 2023-12-17 DIAGNOSIS — Z01812 Encounter for preprocedural laboratory examination: Secondary | ICD-10-CM

## 2023-12-17 HISTORY — PX: CATARACT EXTRACTION W/PHACO: SHX586

## 2023-12-17 HISTORY — DX: Nonrheumatic mitral (valve) insufficiency: I34.0

## 2023-12-17 HISTORY — DX: Nonrheumatic aortic (valve) insufficiency: I35.1

## 2023-12-17 HISTORY — DX: Dependence on renal dialysis: Z99.2

## 2023-12-17 HISTORY — DX: Other cardiomyopathies: I42.8

## 2023-12-17 HISTORY — DX: End stage renal disease: N18.6

## 2023-12-17 HISTORY — DX: Chronic systolic (congestive) heart failure: I50.22

## 2023-12-17 HISTORY — DX: Pulmonary hypertension, unspecified: I27.20

## 2023-12-17 HISTORY — DX: Other ill-defined heart diseases: I51.89

## 2023-12-17 LAB — BASIC METABOLIC PANEL
Anion gap: 9 (ref 5–15)
BUN: 34 mg/dL — ABNORMAL HIGH (ref 6–20)
CO2: 27 mmol/L (ref 22–32)
Calcium: 7.7 mg/dL — ABNORMAL LOW (ref 8.9–10.3)
Chloride: 101 mmol/L (ref 98–111)
Creatinine, Ser: 6.16 mg/dL — ABNORMAL HIGH (ref 0.61–1.24)
GFR, Estimated: 11 mL/min — ABNORMAL LOW (ref >60)
Glucose, Bld: 142 mg/dL — ABNORMAL HIGH (ref 70–99)
Potassium: 4.3 mmol/L (ref 3.5–5.1)
Sodium: 137 mmol/L (ref 135–145)

## 2023-12-17 LAB — GLUCOSE, CAPILLARY: Glucose-Capillary: 122 mg/dL — ABNORMAL HIGH (ref 70–99)

## 2023-12-17 SURGERY — PHACOEMULSIFICATION, CATARACT, WITH IOL INSERTION
Anesthesia: Monitor Anesthesia Care | Site: Eye | Laterality: Left

## 2023-12-17 MED ORDER — TETRACAINE HCL 0.5 % OP SOLN
1.0000 [drp] | OPHTHALMIC | Status: DC | PRN
  Administered 2023-12-17 (×3): 1 [drp] via OPHTHALMIC

## 2023-12-17 MED ORDER — ARMC OPHTHALMIC DILATING DROPS
1.0000 | OPHTHALMIC | Status: DC | PRN
  Administered 2023-12-17 (×3): 1 via OPHTHALMIC

## 2023-12-17 MED ORDER — MIDAZOLAM HCL 2 MG/2ML IJ SOLN
INTRAMUSCULAR | Status: AC
  Filled 2023-12-17: qty 2

## 2023-12-17 MED ORDER — SIGHTPATH DOSE#1 BSS IO SOLN
INTRAOCULAR | Status: DC | PRN
  Administered 2023-12-17: 70 mL via OPHTHALMIC

## 2023-12-17 MED ORDER — SIGHTPATH DOSE#1 BSS IO SOLN
INTRAOCULAR | Status: DC | PRN
  Administered 2023-12-17: 15 mL via INTRAOCULAR

## 2023-12-17 MED ORDER — SIGHTPATH DOSE#1 NA HYALUR & NA CHOND-NA HYALUR IO KIT
PACK | INTRAOCULAR | Status: DC | PRN
  Administered 2023-12-17: 1 via OPHTHALMIC

## 2023-12-17 MED ORDER — BRIMONIDINE TARTRATE-TIMOLOL 0.2-0.5 % OP SOLN
OPHTHALMIC | Status: DC | PRN
  Administered 2023-12-17: 1 [drp] via OPHTHALMIC

## 2023-12-17 MED ORDER — TETRACAINE HCL 0.5 % OP SOLN
OPHTHALMIC | Status: AC
Start: 2023-12-17 — End: ?
  Filled 2023-12-17: qty 4

## 2023-12-17 MED ORDER — LIDOCAINE HCL (PF) 2 % IJ SOLN
INTRAOCULAR | Status: DC | PRN
  Administered 2023-12-17: 4 mL via INTRAOCULAR

## 2023-12-17 MED ORDER — ARMC OPHTHALMIC DILATING DROPS
OPHTHALMIC | Status: AC
Start: 2023-12-17 — End: ?
  Filled 2023-12-17: qty 0.5

## 2023-12-17 MED ORDER — MOXIFLOXACIN HCL 0.5 % OP SOLN
OPHTHALMIC | Status: DC | PRN
  Administered 2023-12-17: .2 mL via OPHTHALMIC

## 2023-12-17 MED ORDER — MIDAZOLAM HCL 2 MG/2ML IJ SOLN
INTRAMUSCULAR | Status: DC | PRN
  Administered 2023-12-17 (×2): 1 mg via INTRAVENOUS

## 2023-12-17 SURGICAL SUPPLY — 12 items
CANNULA ANT/CHMB 27G (MISCELLANEOUS) IMPLANT
CANNULA ANT/CHMB 27GA (MISCELLANEOUS)
CATARACT SUITE SIGHTPATH (MISCELLANEOUS) ×1
DISSECTOR HYDRO NUCLEUS 50X22 (MISCELLANEOUS) ×1 IMPLANT
DRSG TEGADERM 2-3/8X2-3/4 SM (GAUZE/BANDAGES/DRESSINGS) ×1 IMPLANT
FEE CATARACT SUITE SIGHTPATH (MISCELLANEOUS) ×1 IMPLANT
GLOVE SURG SYN 7.5 E (GLOVE) ×1
GLOVE SURG SYN 7.5 PF PI (GLOVE) ×1 IMPLANT
GLOVE SURG SYN 8.5 E (GLOVE) ×1
GLOVE SURG SYN 8.5 PF PI (GLOVE) ×1 IMPLANT
LENS CLAREON 20.5 (Intraocular Lens) ×1 IMPLANT
LENS IOL CLRN 20.5 (Intraocular Lens) IMPLANT

## 2023-12-17 NOTE — Transfer of Care (Signed)
Immediate Anesthesia Transfer of Care Note  Patient: Mark [name]  Procedure(s) Performed: CATARACT EXTRACTION PHACO AND INTRAOCULAR LENS PLACEMENT (IOC) LEFT DIABETIC 2.17 00:21.0 (Left: Eye)  Patient Location: PACU  Anesthesia Type: MAC  Level of Consciousness: awake, alert  and patient cooperative  Airway and Oxygen Therapy: Patient Spontanous Breathing and Patient connected to supplemental oxygen  Post-op Assessment: Post-op Vital signs reviewed, Patient's Cardiovascular Status Stable, Respiratory Function Stable, Patent Airway and No signs of Nausea or vomiting  Post-op Vital Signs: Reviewed and stable  Complications: No notable events documented.
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
## 2023-12-17 NOTE — Op Note (Signed)
OPERATIVE NOTE  CLYDELL MAHANNAH [id number] 12/17/2023   PREOPERATIVE DIAGNOSIS: Nuclear sclerotic cataract left eye. H25.12   POSTOPERATIVE DIAGNOSIS: Nuclear sclerotic cataract left eye. H25.12   PROCEDURE:  Phacoemusification with posterior chamber intraocular lens placement of the left eye  Ultrasound time: Procedure(s): CATARACT EXTRACTION PHACO AND INTRAOCULAR LENS PLACEMENT (IOC) LEFT DIABETIC 2.17 00:21.0 (Left)  LENS:   Implant Name Type Inv. Item Serial No. Manufacturer Lot No. LRB No. Used Action  LENS CLAREON 20.5 - [id number] Intraocular Lens LENS CLAREON 20.5 [id number] [employer]IGHT[employer]  Left 1 Implanted      SURGEON:  Julious Payer. Rolley Sims, MD   ANESTHESIA:  Topical with tetracaine drops, augmented with 1% preservative-free intracameral lidocaine.   COMPLICATIONS:  None.   DESCRIPTION OF PROCEDURE:  The patient was identified in the holding room and transported to the operating room and placed in the supine position under the operating microscope.  The left eye was identified as the operative eye, which was prepped and draped in the usual sterile ophthalmic fashion.   A 1 millimeter clear-corneal paracentesis was made inferotemporally. Preservative-free 1% lidocaine mixed with 1:1,000 bisulfite-free aqueous solution of epinephrine was injected into the anterior chamber. The anterior chamber was then filled with Viscoat viscoelastic. A 2.4 millimeter keratome was used to make a clear-corneal incision superotemporally. A curvilinear capsulorrhexis was made with a cystotome and capsulorrhexis forceps. Balanced salt solution was used to hydrodissect and hydrodelineate the nucleus. Phacoemulsification was then used to remove the lens nucleus and epinucleus. The remaining cortex was then removed using the irrigation and aspiration handpiece. Provisc was then placed into the capsular bag to distend it for lens placement. An +20.50 D SY60WFintraocular lens was then injected into the capsular  bag. The remaining viscoelastic was aspirated.   Wounds were hydrated with balanced salt solution.  The anterior chamber was inflated to a physiologic pressure with balanced salt solution.  No wound leaks were noted. Vigamox was injected intracamerally.  Timolol and Brimonidine drops were applied to the eye.  The patient was taken to the recovery room in stable condition without complications of anesthesia or surgery.  Rolly Pancake [place] 12/17/2023, 9:22 AM

## 2023-12-17 NOTE — Anesthesia Postprocedure Evaluation (Signed)
Anesthesia Post Note  Patient: CASSIDY DIEDRICH  Procedure(s) Performed: CATARACT EXTRACTION PHACO AND INTRAOCULAR LENS PLACEMENT (IOC) LEFT DIABETIC 2.17 00:21.0 (Left: Eye)  Patient location during evaluation: PACU Anesthesia Type: MAC Level of consciousness: awake and alert Pain management: pain level controlled Vital Signs Assessment: post-procedure vital signs reviewed and stable Respiratory status: spontaneous breathing, nonlabored ventilation, respiratory function stable and patient connected to nasal cannula oxygen Cardiovascular status: stable and blood pressure returned to baseline Postop Assessment: no apparent nausea or vomiting Anesthetic complications: no   No notable events documented.   Last Vitals:  Vitals:   12/17/23 0745  BP: 128/64  Pulse: (!) 50  Resp: 11  Temp: (!) 36.3 C  SpO2: 100%    Last Pain:  Vitals:   12/17/23 0745  TempSrc: Temporal  PainSc: 0-No pain                 Hezikiah Retzloff C Reshma Hoey

## 2023-12-18 ENCOUNTER — Encounter: Payer: Self-pay | Admitting: Ophthalmology

## 2023-12-24 ENCOUNTER — Ambulatory Visit: Payer: Medicare Other | Admitting: Internal Medicine

## 2023-12-24 DIAGNOSIS — E11621 Type 2 diabetes mellitus with foot ulcer: Secondary | ICD-10-CM | POA: Diagnosis not present

## 2023-12-24 NOTE — Progress Notes (Signed)
Mark, HARBESON [name] ([id number]) [id number] Page 1 of 10 Visit Report for 12/24/2023 Arrival Information Details Patient Name: Date of Service: [name] 12/24/2023 12:15 PM Medical Record Number: [id number] Patient Account Number: [id number] Date of Birth/Sex: Treating RN: 1981/04/10 (42 y.o. Mark [name] Primary Care Mark [name] Other Clinician: Referring Mark [name]: Treating Mark [name]/Extender: RO BSO N, Mark [name], Mark [name]: 2 Visit Information History Since Last Visit Added or deleted any medications: No Patient Arrived: Ambulatory Any new allergies or adverse reactions: No Arrival Time: 12:21 Had [name] fall or experienced change in No Accompanied By: self activities of daily living that may affect Transfer Assistance: None risk of falls: Patient Identification Verified: Yes Hospitalized since last visit: No Secondary Verification Process Completed: Yes Has Dressing in Place as Prescribed: Yes Patient Requires Transmission-Based Precautions: No Pain Present Now: No Patient Has Alerts: Yes Patient Alerts: Diabetic Type 2 Electronic Signature(s) Signed: 12/24/2023 4:44:21 PM By: [name] MSN RN CNS WTA Entered By: [name] on 12/24/2023 09:23:24 -------------------------------------------------------------------------------- Clinic Level of Care Assessment Details Patient Name: Date of Service: [name]. 12/24/2023 12:15 PM Medical Record Number: [id number] Patient Account Number: [id number] Date of Birth/Sex: Treating RN: 11-09-1981 (42 y.o. Mark [name] Primary Care Passion Lavin: Gillermo [name] Other Clinician: Referring Elaria Osias: Treating Alexy Heldt/Extender: RO BSO N, Mark [name], Mark [name]: 2 Clinic Level of Care Assessment Items TOOL 1 Quantity Score []  - 0 Use when EandM and Procedure is performed on INITIAL visit ASSESSMENTS - Nursing Assessment /  Reassessment []  - 0 General Physical Exam (combine w/ comprehensive assessment (listed just below) when performed on new pt. evals) []  - 0 Comprehensive Assessment (HX, ROS, Risk Assessments, Wounds Hx, etc.) ASSESSMENTS - Wound and Skin Assessment / Reassessment []  - 0 Dermatologic / Skin Assessment (not related to wound area) ASSESSMENTS - Ostomy and/or Continence Assessment and Care Mark, MANWELL [name] ([id number]) [id number] Page 2 of 10 []  - 0 Incontinence Assessment and Management []  - 0 Ostomy Care Assessment and Management (repouching, etc.) PROCESS - Coordination of Care []  - 0 Simple Patient / Family Education for ongoing care []  - 0 Complex (extensive) Patient / Family Education for ongoing care []  - 0 Staff obtains Chiropractor, Records, T Results / Process Orders est []  - 0 Staff telephones HHA, Nursing Homes / Clarify orders / etc []  - 0 Routine Transfer to another Facility (non-emergent condition) []  - 0 Routine Hospital Admission (non-emergent condition) []  - 0 New Admissions / Manufacturing engineer / Ordering NPWT Apligraf, etc. , []  - 0 Emergency Hospital Admission (emergent condition) PROCESS - Special Needs []  - 0 Pediatric / Minor Patient Management []  - 0 Isolation Patient Management []  - 0 Hearing / Language / Visual special needs []  - 0 Assessment of Community assistance (transportation, D/C planning, etc.) []  - 0 Additional assistance / Altered mentation []  - 0 Support Surface(s) Assessment (bed, cushion, seat, etc.) INTERVENTIONS - Miscellaneous []  - 0 External ear exam []  - 0 Patient Transfer (multiple staff / Nurse, adult / Similar devices) []  - 0 Simple Staple / Suture removal (25 or less) []  - 0 Complex Staple / Suture removal (26 or more) []  - 0 Hypo/Hyperglycemic Management (do not check if billed separately) []  - 0 Ankle / Brachial Index (ABI) - do not check if billed separately Has the patient been seen  at the hospital within the last three years: Yes Total Score: 0 Level Of Care: ____ Electronic  Signature(s) Signed: 12/24/2023 4:44:21 PM By: [name] MSN RN CNS WTA Entered By: [name] on 12/24/2023 10:01:06 -------------------------------------------------------------------------------- Encounter Discharge Information Details Patient Name: Date of Service: [name]. 12/24/2023 12:15 PM Medical Record Number: [id number] Patient Account Number: [id number] Date of Birth/Sex: Treating RN: 1981/06/05 (42 y.o. Mark [name] Primary Care Jermaine Neuharth: Gillermo [name] Other Clinician: Referring Affie Gasner: Treating Raquel Racey/Extender: RO BSO N, Mark [name], Mark [name]: 2 Encounter Discharge Information Items Post Procedure Vitals Discharge Condition: Stable Temperature (F): 97.6 [name], [name] ([id number]) [id number] Page 3 of 10 Ambulatory Status: Ambulatory Pulse (bpm): 81 Discharge Destination: Home Respiratory Rate (breaths/min): 18 Transportation: Other Blood Pressure (mmHg): 163/95 Accompanied By: self Schedule Follow-up Appointment: Yes Clinical Summary of Care: Electronic Signature(s) Signed: 12/24/2023 4:44:21 PM By: [name] MSN RN CNS WTA Entered By: [name] on 12/24/2023 10:02:54 -------------------------------------------------------------------------------- Lower Extremity Assessment Details Patient Name: Date of Service: [name]. 12/24/2023 12:15 PM Medical Record Number: [id number] Patient Account Number: [id number] Date of Birth/Sex: Treating RN: 03-20-1981 (42 y.o. Mark [name] Primary Care Trysten Bernard: Gillermo [name] Other Clinician: Referring Rossy Virag: Treating Cleven Jansma/Extender: RO BSO N, Mark [name], Mark [name]: 2 Edema Assessment Assessed: [Left: No] [Right: No] [Left: Edema] [Right: :] Calf Left: Right: Point of Measurement: 30 cm From Medial  Instep 33.5 cm Ankle Left: Right: Point of Measurement: 12 cm From Medial Instep 24 cm Knee To Floor Left: Right: From Medial Instep 35 cm Vascular Assessment Pulses: Dorsalis Pedis Palpable: [Right:Yes] Extremity colors, hair growth, and conditions: Extremity Color: [Right:Red] Hair Growth on Extremity: [Right:No] Temperature of Extremity: [Right:Warm] Capillary Refill: [Right:< 3 seconds] Dependent Rubor: [Right:No] Blanched when Elevated: [Right:No No] Toe Nail Assessment Left: Right: Thick: No Discolored: No Deformed: No Improper Length and Hygiene: No Mark, NAUSS [name] ([id number]) [id number] Page 4 of 10 Electronic Signature(s) Signed: 12/24/2023 4:44:21 PM By: [name] MSN RN CNS WTA Entered By: [name] on 12/24/2023 09:34:42 -------------------------------------------------------------------------------- Multi Wound Chart Details Patient Name: Date of Service: [name]. 12/24/2023 12:15 PM Medical Record Number: [id number] Patient Account Number: [id number] Date of Birth/Sex: Treating RN: [birth year] (42 y.o. Mark [name] Primary Care Delbra Zellars: Gillermo [name] Other Clinician: Referring Tanieka Pownall: Treating Chalet Kerwin/Extender: RO BSO N, Mark [name], Mark [name]: 2 Vital Signs Height(in): 70 Pulse(bpm): 81 Weight(lbs): 145 Blood Pressure(mmHg): 163/95 Body Mass Index(BMI): 20.8 Temperature(F): 97.6 Respiratory Rate(breaths/min): 18 [1:Photos:] [N/[name]:N/[name]] Right, Lateral Lower Leg Right, Anterior T Second oe N/[name] Wound Location: Gradually Appeared Gradually Appeared N/[name] Wounding Event: Diabetic Wound/Ulcer of the Lower Diabetic Wound/Ulcer of the Lower N/[name] Primary Etiology: Extremity Extremity Congestive Heart Failure, Congestive Heart Failure, N/[name] Comorbid History: Hypertension, Type II Diabetes, End Hypertension, Type II Diabetes, End Stage Renal Disease, Neuropathy Stage Renal Disease,  Neuropathy 07/29/2023 07/29/2023 N/[name] Date Acquired: 2 2 N/[name] Weeks of [name]: Open Open N/[name] Wound Status: No No N/[name] Wound Recurrence: 1x0.7x0.1 0.2x0.2x0.1 N/[name] Measurements L x W x D (cm) 0.55 0.031 N/[name] [name] (cm) : rea 0.055 0.003 N/[name] Volume (cm) : 60.20% 97.30% N/[name] % Reduction in [name] rea: 60.10% 97.30% N/[name] % Reduction in Volume: Grade 2 Grade 2 N/[name] Classification: Medium Medium N/[name] Exudate [name] mount: Serosanguineous Serosanguineous N/[name] Exudate Type: red, brown red, brown N/[name] Exudate Color: Medium (34-66%) Medium (34-66%) N/[name] Granulation [name] mount: Red Red N/[name] Granulation Quality: None Present (0%) N/[name] N/[name] Necrotic [name] mount: Fat Layer (Subcutaneous Tissue): Yes Fat Layer (Subcutaneous Tissue): Yes  N/[name] Exposed Structures: Fascia: No Fascia: No Tendon: No Tendon: No Muscle: No Muscle: No Joint: No Joint: No Bone: No Bone: No Debridement - Selective/Open Wound Debridement - Selective/Open Wound N/[name] Debridement: Pre-procedure Verification/Time Out 12:48 12:48 N/[name] Taken: Necrotic/Eschar Necrotic/Eschar N/[name] Tissue Debrided: Non-Viable Tissue Non-Viable Tissue N/[name] LevelJAYLN, HULTON [name] ([id number]) [id number] Page 5 of 10 0.55 0.03 N/[name] Debridement [name] (sq cm): rea Curette Curette N/[name] Instrument: Minimum Minimum N/[name] Bleeding: Pressure Pressure N/[name] Hemostasis Achieved: 0 0 N/[name] Procedural Pain: 0 0 N/[name] Post Procedural Pain: Procedure was tolerated well Procedure was tolerated well N/[name] Debridement [name] Response: 1x0.7x0.1 0.2x0.2x0.1 N/[name] Post Debridement Measurements L x W x D (cm) 0.055 0.003 N/[name] Post Debridement Volume: (cm) Debridement Debridement N/[name] Procedures Performed: [name] Notes Electronic Signature(s) Signed: 12/24/2023 4:44:21 PM By: [name] MSN RN CNS WTA Entered By: [name] on 12/24/2023 09:50:47 -------------------------------------------------------------------------------- Multi-Disciplinary Care  Plan Details Patient Name: Date of Service: [name]. 12/24/2023 12:15 PM Medical Record Number: [id number] Patient Account Number: [id number] Date of Birth/Sex: Treating RN: 06/23/1981 (42 y.o. Mark [name] Primary Care Arria Naim: Gillermo [name] Other Clinician: Referring Yannely Kintzel: Treating Jacki Couse/Extender: RO BSO N, Mark [name], Mark [name]: 2 Active Inactive Wound/Skin Impairment Nursing Diagnoses: Impaired tissue integrity Knowledge deficit related to ulceration/compromised skin integrity Goals: Patient/caregiver will verbalize understanding of skin care regimen Date Initiated: 12/10/2023 Target Resolution Date: 12/29/2023 Goal Status: Active Ulcer/skin breakdown will have [name] volume reduction of 30% by week 4 Date Initiated: 12/10/2023 Target Resolution Date: 01/10/2024 Goal Status: Active Ulcer/skin breakdown will have [name] volume reduction of 50% by week 8 Date Initiated: 12/10/2023 Target Resolution Date: 02/10/2024 Goal Status: Active Ulcer/skin breakdown will have [name] volume reduction of 80% by week 12 Date Initiated: 12/10/2023 Target Resolution Date: 03/09/2024 Goal Status: Active Interventions: Assess patient/caregiver ability to obtain necessary supplies Assess patient/caregiver ability to perform ulcer/skin care regimen upon admission and as needed Assess ulceration(s) every visit Provide education on ulcer and skin care [name] Activities: Skin care regimen initiated : 12/10/2023 Notes: Mark, BREIDINGER [name] ([id number]) [id number] Page 6 of 10 Electronic Signature(s) Signed: 12/24/2023 4:44:21 PM By: [name] MSN RN CNS WTA Entered By: [name] on 12/24/2023 10:01:26 -------------------------------------------------------------------------------- Pain Assessment Details Patient Name: Date of Service: [name]. 12/24/2023 12:15 PM Medical Record Number: [id number] Patient Account  Number: [id number] Date of Birth/Sex: Treating RN: 06-23-1981 (42 y.o. Mark [name] Primary Care Kaijah Abts: Gillermo [name] Other Clinician: Referring Keith Cancio: Treating Maher Shon/Extender: RO BSO N, Mark [name], Mark [name]: 2 Active Problems Location of Pain Severity and Description of Pain Patient Has Paino No Site Locations Pain Management and Medication Current Pain Management: Electronic Signature(s) Signed: 12/24/2023 4:44:21 PM By: [name] MSN RN CNS WTA Entered By: [name] on 12/24/2023 09:30:55 -------------------------------------------------------------------------------- Patient/Caregiver Education Details Patient Name: Date of Service: [name] 12/26/2024andnbsp12:15 PM Lady Deutscher ([id number]) [id number] Page 7 of 10 Medical Record Number: [id number] Patient Account Number: [id number] Date of Birth/Gender: Treating RN: 09-Jul-1981 (42 y.o. Mark [name] Primary Care Physician: Gillermo [name] Other Clinician: Referring Physician: Treating Physician/Extender: RO BSO N, Mark [name], Mark [name]: 2 Education Assessment Education Provided To: Patient Education Topics Provided Wound Debridement: Handouts: Wound Debridement Methods: Explain/Verbal Responses: State content correctly Wound/Skin Impairment: Handouts: Caring for Your Ulcer Methods: Explain/Verbal Responses: State content correctly Electronic Signature(s) Signed: 12/24/2023 4:44:21 PM By: [name] MSN RN CNS WTA Entered By:  [name] on 12/24/2023 10:01:45 -------------------------------------------------------------------------------- Wound Assessment Details Patient Name: Date of Service: [name] 12/24/2023 12:15 PM Medical Record Number: [id number] Patient Account Number: [id number] Date of Birth/Sex: Treating RN: 03/17/1981 (42 y.o. Mark [name] Primary Care Law Corsino:  Gillermo [name] Other Clinician: Referring Merin Borjon: Treating Aodhan Scheidt/Extender: RO BSO N, Mark [name], Mark [name]: 2 Wound Status Wound Number: 1 Primary Diabetic Wound/Ulcer of the Lower Extremity Etiology: Wound Location: Right, Lateral Lower Leg Wound Open Wounding Event: Gradually Appeared Status: Date Acquired: 07/29/2023 Comorbid Congestive Heart Failure, Hypertension, Type II Diabetes, End Weeks Of [name]: 2 History: Stage Renal Disease, Neuropathy Clustered Wound: No Photos Wound Measurements Mark, DEDOMINICIS [name] ([id number]) Length: (cm) 1 Width: (cm) 0.7 Depth: (cm) 0.1 Area: (cm) 0.55 Volume: (cm) 0.055 [id number] Page 8 of 10 % Reduction in Area: 60.2% % Reduction in Volume: 60.1% Wound Description Classification: Grade 2 Exudate Amount: Medium Exudate Type: Serosanguineous Exudate Color: red, brown Foul Odor After Cleansing: No Slough/Fibrino No Wound Bed Granulation Amount: Medium (34-66%) Exposed Structure Granulation Quality: Red Fascia Exposed: No Necrotic Amount: None Present (0%) Fat Layer (Subcutaneous Tissue) Exposed: Yes Tendon Exposed: No Muscle Exposed: No Joint Exposed: No Bone Exposed: No [name] Notes Wound #1 (Lower Leg) Wound Laterality: Right, Lateral Cleanser Soap and Water Discharge Instruction: Gently cleanse wound with antibacterial soap, rinse and pat dry prior to dressing wounds Peri-Wound Care Topical Primary Dressing Hydrofera Blue Ready Transfer Foam, 2.5x2.5 (in/in) Discharge Instruction: Apply Hydrofera Blue Ready to wound bed as directed Secondary Dressing Coverlet Latex-Free Fabric Adhesive Dressings Discharge Instruction: 2 x 3 3/4 Secured With Tubigrip Size D, 3x10 (in/yd) Compression Wrap Compression Stockings Add-Ons Electronic Signature(s) Signed: 12/24/2023 4:44:21 PM By: [name] MSN RN CNS WTA Entered By: [name] on 12/24/2023  09:32:29 -------------------------------------------------------------------------------- Wound Assessment Details Patient Name: Date of Service: [name]. 12/24/2023 12:15 PM Medical Record Number: [id number] Patient Account Number: [id number] Date of Birth/Sex: Treating RN: 1981/04/26 (42 y.o. Mark [name] Primary Care Jamien Casanova: Gillermo [name] Other Clinician: Referring Monzerrath Mcburney: Treating Emika Tiano/Extender: Chauncey Mann, Mark [name], Mark [name]: 2 Mark, ELVIDGE [name] ([id number]) [id number] Page 9 of 10 Wound Status Wound Number: 2 Primary Diabetic Wound/Ulcer of the Lower Extremity Etiology: Wound Location: Right, Anterior T Second oe Wound Open Wounding Event: Gradually Appeared Status: Date Acquired: 07/29/2023 Comorbid Congestive Heart Failure, Hypertension, Type II Diabetes, End Weeks Of [name]: 2 History: Stage Renal Disease, Neuropathy Clustered Wound: No Photos Wound Measurements Length: (cm) 0.2 Width: (cm) 0.2 Depth: (cm) 0.1 Area: (cm) 0.031 Volume: (cm) 0.003 % Reduction in Area: 97.3% % Reduction in Volume: 97.3% Wound Description Classification: Grade 2 Exudate Amount: Medium Exudate Type: Serosanguineous Exudate Color: red, brown Foul Odor After Cleansing: No Slough/Fibrino No Wound Bed Granulation Amount: Medium (34-66%) Exposed Structure Granulation Quality: Red Fascia Exposed: No Fat Layer (Subcutaneous Tissue) Exposed: Yes Tendon Exposed: No Muscle Exposed: No Joint Exposed: No Bone Exposed: No [name] Notes Wound #2 (Toe Second) Wound Laterality: Right, Anterior Cleanser Soap and Water Discharge Instruction: Gently cleanse wound with antibacterial soap, rinse and pat dry prior to dressing wounds Peri-Wound Care Topical Primary Dressing Hydrofera Blue Ready Transfer Foam, 2.5x2.5 (in/in) Discharge Instruction: Apply Hydrofera Blue Ready to wound bed as directed Secondary  Dressing Coverlet Latex-Free Fabric Adhesive Dressings Discharge Instruction: 1.5 x 2 Secured With Compression Wrap Compression Stockings Add-Ons Electronic Signature(s) Signed: 12/24/2023 4:44:21 PM By: [name] MSN RN CNS WTA Mark [name], Mark [name] (  [id number]) [id number] Page 10 of 10 Entered By: [name] on 12/24/2023 09:50:27 -------------------------------------------------------------------------------- Vitals Details Patient Name: Date of Service: [name] 12/24/2023 12:15 PM Medical Record Number: [id number] Patient Account Number: [id number] Date of Birth/Sex: Treating RN: Jun 07, 1981 (42 y.o. Mark [name] Primary Care Jabarie Pop: Gillermo [name] Other Clinician: Referring Ryli Standlee: Treating Haizley Cannella/Extender: RO BSO N, Mark [name], Mark [name]: 2 Vital Signs Time Taken: 12:23 Temperature (F): 97.6 Height (in): 70 Pulse (bpm): 81 Weight (lbs): 145 Respiratory Rate (breaths/min): 18 Body Mass Index (BMI): 20.8 Blood Pressure (mmHg): 163/95 Reference Range: 80 - 120 mg / dl Electronic Signature(s) Signed: 12/24/2023 4:44:21 PM By: [name] MSN RN CNS WTA Entered By: [name] on 12/24/2023 09:26:02

## 2023-12-25 NOTE — Progress Notes (Signed)
ARSEN, LANES A ([id number]) [id number] Page 1 of 8 Visit Report for 12/24/2023 Debridement Details Patient Name: Date of Service: [name] 12/24/2023 12:15 PM Medical Record Number: [id number] Patient Account Number: [id number] Date of Birth/Sex: Treating RN: 1981/05/11 (42 y.o. Roel Cluck Primary Care Provider: Gillermo Murdoch Other Clinician: Referring Provider: Treating Provider/Extender: RO BSO N, MICHA EL Margaree Mackintosh, Ahmed Weeks in Treatment: 2 Debridement Performed for Assessment: Wound #1 Right,Lateral Lower Leg Performed By: Physician Maxwell Caul, MD The following information was scribed by: [name] The information was scribed for: Baltazar Najjar G Debridement Type: Debridement Severity of Tissue Pre Debridement: Fat layer exposed Level of Consciousness (Pre-procedure): Awake and Alert Pre-procedure Verification/Time Out Yes - 12:48 Taken: Start Time: 12:48 Percent of Wound Bed Debrided: 100% T Area Debrided (cm): otal 0.55 Tissue and other material debrided: Viable, Non-Viable, Eschar Level: Non-Viable Tissue Debridement Description: Selective/Open Wound Instrument: Curette Bleeding: Minimum Hemostasis Achieved: Pressure Procedural Pain: 0 Post Procedural Pain: 0 Response to Treatment: Procedure was tolerated well Level of Consciousness (Post- Awake and Alert procedure): Post Debridement Measurements of Total Wound Length: (cm) 1 Width: (cm) 0.7 Depth: (cm) 0.1 Volume: (cm) 0.055 Character of Wound/Ulcer Post Debridement: Stable Severity of Tissue Post Debridement: Fat layer exposed Post Procedure Diagnosis Same as Pre-procedure Electronic Signature(s) Signed: 12/24/2023 4:44:21 PM By: [name] MSN RN CNS WTA Signed: 12/25/2023 12:26:56 PM By: Baltazar Najjar MD Entered By: [name] on 12/24/2023 09:49:08 Leeroy Bock A ([id number]) [id number] Page 2 of  8 -------------------------------------------------------------------------------- Debridement Details Patient Name: Date of Service: TO Mark Orion A. 12/24/2023 12:15 PM Medical Record Number: [id number] Patient Account Number: [id number] Date of Birth/Sex: Treating RN: [birth year] (42 y.o. Roel Cluck Primary Care Provider: Gillermo Murdoch Other Clinician: Referring Provider: Treating Provider/Extender: RO BSO N, MICHA EL Margaree Mackintosh, Ahmed Weeks in Treatment: 2 Debridement Performed for Assessment: Wound #2 Right,Anterior T Second oe Performed By: Physician Maxwell Caul, MD The following information was scribed by: [name] The information was scribed for: Baltazar Najjar G Debridement Type: Debridement Severity of Tissue Pre Debridement: Fat layer exposed Level of Consciousness (Pre-procedure): Awake and Alert Pre-procedure Verification/Time Out Yes - 12:48 Taken: Start Time: 12:48 Percent of Wound Bed Debrided: 100% T Area Debrided (cm): otal 0.03 Tissue and other material debrided: Viable, Non-Viable, Eschar Level: Non-Viable Tissue Debridement Description: Selective/Open Wound Instrument: Curette Bleeding: Minimum Hemostasis Achieved: Pressure Procedural Pain: 0 Post Procedural Pain: 0 Response to Treatment: Procedure was tolerated well Level of Consciousness (Post- Awake and Alert procedure): Post Debridement Measurements of Total Wound Length: (cm) 0.2 Width: (cm) 0.2 Depth: (cm) 0.1 Volume: (cm) 0.003 Character of Wound/Ulcer Post Debridement: Stable Severity of Tissue Post Debridement: Fat layer exposed Post Procedure Diagnosis Same as Pre-procedure Electronic Signature(s) Signed: 12/24/2023 4:44:21 PM By: [name] MSN RN CNS WTA Signed: 12/25/2023 12:26:56 PM By: Baltazar Najjar MD Entered By: [name] on 12/24/2023 09:50:07 HPI Details -------------------------------------------------------------------------------- Mark [name] ([id number]) [id number] Page 3 of 8 Patient Name: Date of Service: [name] 12/24/2023 12:15 PM Medical Record Number: [id number] Patient Account Number: [id number] Date of Birth/Sex: Treating RN: Dec 15, 1981 (42 y.o. Roel Cluck Primary Care Provider: Gillermo Murdoch Other Clinician: Referring Provider: Treating Provider/Extender: RO BSO N, MICHA EL Margaree Mackintosh, Ahmed Weeks in Treatment: 2 History of Present Illness Chronic/Inactive Conditions Condition 1: 12-10-2023 patient's ABIs here in the clinic were 1.3 on the right and noncompressible on the descending for formal arterial studies  in order to evaluate the situation here. HPI Description: 12-10-2023 upon evaluation today patient appears to be doing well currently in regard to his wounds which he is seen today for initial evaluation here in the clinic with regard to. He has a wound on his right leg as well as his right second toe on the dorsal aspect. With that being said he tells me that these wounds have been present since summer around July or August. This is quite a long time considering how small they appear. He does have diabetes. He is on medication for this regularly. He also has end-stage renal disease is on dialysis Monday Wednesdays and Fridays. With that being said I think all this is contributing to some of the issues she is having with swelling which I think is contributing to his healing as well. There is also some question of his arterial flow unfortunately he was noncompressible on evaluation here in the clinic today and subsequently I think that he is going to require an arterial study with ABI and TBI to be done promptly in order to evaluate and see where things stand here. He voiced understanding. Patient's most recent hemoglobin A1c was 6.4 and that was on 11-06-2023. He also has a history of congestive heart failure, end-stage renal disease for which he is on dialysis, and  cataract issues which she is actually having surgery on next Thursday. 12/26; this patient admitted to the clinic 2 weeks ago. He has wounds on his right lateral lower leg and on the proximal right second toe. We have been using Hydrofera Blue with a Tubigrip. 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 ABI in our clinic was 1.3 Electronic Signature(s) Signed: 12/25/2023 12:26:56 PM By: Baltazar Najjar MD Entered By: Baltazar Najjar on 12/24/2023 10:02:17 -------------------------------------------------------------------------------- Physical Exam Details Patient Name: Date of Service: TO Mark Orion A. 12/24/2023 12:15 PM Medical Record Number: [id number] Patient Account Number: [id number] Date of Birth/Sex: Treating RN: [birth year] (42 y.o. Roel Cluck Primary Care Provider: Gillermo Murdoch Other Clinician: Referring Provider: Treating Provider/Extender: RO BSO N, MICHA EL Margaree Mackintosh, Ahmed Weeks in Treatment: 2 Constitutional Patient is hypertensive.. Pulse regular and within target range for patient.Marland Kitchen Respirations regular, non-labored and within target range.. Temperature is normal and within the target range for the patient.Marland Kitchen appears in no distress. Notes Wound exam; the area on the right lateral leg is smaller. I removed the eschar from the circumference. He has an additional wound on the proximal aspect of the right second toe dorsally. I will also remove the eschar from the surface here. Both wounds are measuring smaller Electronic Signature(s) Signed: 12/25/2023 12:26:56 PM By: Baltazar Najjar MD Entered By: Baltazar Najjar on 12/24/2023 10:03:58 Leeroy Bock A ([id number]) [id number] Page 4 of 8 -------------------------------------------------------------------------------- Physician Orders Details Patient Name: Date of Service: TO Mark Orion A.  12/24/2023 12:15 PM Medical Record Number: [id number] Patient Account Number: [id number] Date of Birth/Sex: Treating RN: 09-17-1981 (42 y.o. Roel Cluck Primary Care Provider: Gillermo Murdoch Other Clinician: Referring Provider: Treating Provider/Extender: RO BSO N, MICHA EL Margaree Mackintosh, Ahmed Weeks in Treatment: 2 Verbal / Phone Orders: No Diagnosis Coding Follow-up Appointments Return Appointment in 2 weeks. Bathing/ [employer] wounds with antibacterial soap and water. Edema Control - Orders / Instructions Tubigrip single layer applied. - D Bilateral Wound Treatment Wound #1 - Lower Leg Wound Laterality: Right, Lateral Cleanser: Soap and Water 3 x  Per Week/30 Days Discharge Instructions: Gently cleanse wound with antibacterial soap, rinse and pat dry prior to dressing wounds Prim Dressing: Hydrofera Blue Ready Transfer Foam, 2.5x2.5 (in/in) 3 x Per Week/30 Days ary Discharge Instructions: Apply Hydrofera Blue Ready to wound bed as directed Secondary Dressing: Coverlet Latex-Free Fabric Adhesive Dressings 3 x Per Week/30 Days Discharge Instructions: 2 x 3 3/4 Secured With: Tubigrip Size D, 3x10 (in/yd) 3 x Per Week/30 Days Wound #2 - T Second oe Wound Laterality: Right, Anterior Cleanser: Soap and Water 3 x Per Week/30 Days Discharge Instructions: Gently cleanse wound with antibacterial soap, rinse and pat dry prior to dressing wounds Prim Dressing: Hydrofera Blue Ready Transfer Foam, 2.5x2.5 (in/in) 3 x Per Week/30 Days ary Discharge Instructions: Apply Hydrofera Blue Ready to wound bed as directed Secondary Dressing: Coverlet Latex-Free Fabric Adhesive Dressings 3 x Per Week/30 Days Discharge Instructions: 1.5 x 2 Electronic Signature(s) Signed: 12/24/2023 4:44:21 PM By: [name] MSN RN CNS WTA Signed: 12/25/2023 12:26:56 PM By: Baltazar Najjar MD Entered By: [name] on 12/24/2023 10:00:57 Leeroy Bock A ([id number])  [id number] Page 5 of 8 -------------------------------------------------------------------------------- Problem List Details Patient Name: Date of Service: TO Mark Orion A. 12/24/2023 12:15 PM Medical Record Number: [id number] Patient Account Number: [id number] Date of Birth/Sex: Treating RN: 1981/09/19 (42 y.o. Roel Cluck Primary Care Provider: Gillermo Murdoch Other Clinician: Referring Provider: Treating Provider/Extender: RO BSO N, MICHA EL Margaree Mackintosh, Ahmed Weeks in Treatment: 2 Active Problems ICD-10 Encounter Code Description Active Date MDM Diagnosis E11.622 Type 2 diabetes mellitus with other skin ulcer 12/10/2023 No Yes L97.812 Non-pressure chronic ulcer of other part of right lower leg with fat layer 12/10/2023 No Yes exposed E11.621 Type 2 diabetes mellitus with foot ulcer 12/10/2023 No Yes L97.512 Non-pressure chronic ulcer of other part of right foot with fat layer exposed 12/10/2023 No Yes I50.42 Chronic combined systolic (congestive) and diastolic (congestive) heart failure 12/10/2023 No Yes N18.6 End stage renal disease 12/10/2023 No Yes Z99.2 Dependence on renal dialysis 12/10/2023 No Yes Inactive Problems Resolved Problems Electronic Signature(s) Signed: 12/24/2023 4:44:21 PM By: [name] MSN RN CNS WTA Signed: 12/25/2023 12:26:56 PM By: Baltazar Najjar MD Entered By: [name] on 12/24/2023 10:01:54 Leeroy Bock A ([id number]) [id number] Page 6 of 8 -------------------------------------------------------------------------------- Progress Note Details Patient Name: Date of Service: TO Mark Orion A. 12/24/2023 12:15 PM Medical Record Number: [id number] Patient Account Number: [id number] Date of Birth/Sex: Treating RN: August 26, 1981 (42 y.o. Roel Cluck Primary Care Provider: Gillermo Murdoch Other Clinician: Referring Provider: Treating Provider/Extender: RO BSO N, MICHA EL  Margaree Mackintosh, Ahmed Weeks in Treatment: 2 Subjective History of Present Illness (HPI) Chronic/Inactive Condition: 12-10-2023 patient's ABIs here in the clinic were 1.3 on the right and noncompressible on the descending for formal arterial studies in order to evaluate the situation here. 12-10-2023 upon evaluation today patient appears to be doing well currently in regard to his wounds which he is seen today for initial evaluation here in the clinic with regard to. He has a wound on his right leg as well as his right second toe on the dorsal aspect. With that being said he tells me that these wounds have been present since summer around July or August. This is quite a long time considering how small they appear. He does have diabetes. He is on medication for this regularly. He also has end-stage renal disease is on dialysis Monday Wednesdays and Fridays. With that being said I think all this is contributing to some  of the issues she is having with swelling which I think is contributing to his healing as well. There is also some question of his arterial flow unfortunately he was noncompressible on evaluation here in the clinic today and subsequently I think that he is going to require an arterial study with ABI and TBI to be done promptly in order to evaluate and see where things stand here. He voiced understanding. Patient's most recent hemoglobin A1c was 6.4 and that was on 11-06-2023. He also has a history of congestive heart failure, end-stage renal disease for which he is on dialysis, and cataract issues which she is actually having surgery on next Thursday. 12/26; this patient admitted to the clinic 2 weeks ago. He has wounds on his right lateral lower leg and on the proximal right second toe. We have been using Hydrofera Blue with a Tubigrip. 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 ABI in our clinic was  1.3 Objective Constitutional Patient is hypertensive.. Pulse regular and within target range for patient.Marland Kitchen Respirations regular, non-labored and within target range.. Temperature is normal and within the target range for the patient.Marland Kitchen appears in no distress. Vitals Time Taken: 12:23 PM, Height: 70 in, Weight: 145 lbs, BMI: 20.8, Temperature: 97.6 F, Pulse: 81 bpm, Respiratory Rate: 18 breaths/min, Blood Pressure: 163/95 mmHg. General Notes: Wound exam; the area on the right lateral leg is smaller. I removed the eschar from the circumference. He has an additional wound on the proximal aspect of the right second toe dorsally. I will also remove the eschar from the surface here. Both wounds are measuring smaller Integumentary (Hair, Skin) Wound #1 status is Open. Original cause of wound was Gradually Appeared. The date acquired was: 07/29/2023. The wound has been in treatment 2 weeks. The wound is located on the Right,Lateral Lower Leg. The wound measures 1cm length x 0.7cm width x 0.1cm depth; 0.55cm^2 area and 0.055cm^3 volume. There is Fat Layer (Subcutaneous Tissue) exposed. There is a medium amount of serosanguineous drainage noted. There is medium (34-66%) red granulation within the wound bed. There is no necrotic tissue within the wound bed. Wound #2 status is Open. Original cause of wound was Gradually Appeared. The date acquired was: 07/29/2023. The wound has been in treatment 2 weeks. The wound is located on the Right,Anterior T Second. The wound measures 0.2cm length x 0.2cm width x 0.1cm depth; 0.031cm^2 area and 0.003cm^3 volume. oe There is Fat Layer (Subcutaneous Tissue) exposed. There is a medium amount of serosanguineous drainage noted. There is medium (34-66%) red granulation within the wound bed. 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 There was a Selective/Open Wound Non-Viable Tissue Debridement with a total area of 0.55 sq cm performed by Maxwell Caul, MD. With the following instrument(s): Curette to remove Viable and Non-Viable tissue/material. Material removed includes Eschar. No specimens were taken. A time  out was conducted at 12:48, prior to the start of the procedure. A Minimum amount of bleeding was controlled with Pressure. The procedure was tolerated well with a pain level of 0 throughout and a pain level of 0 following the procedure. Post Debridement Measurements: 1cm length x 0.7cm width x 0.1cm depth; 0.055cm^3 volume. Character of Wound/Ulcer Post Debridement is stable. Severity of Tissue Post Debridement is: Fat layer exposed. Post procedure Diagnosis Wound #1: Same as Pre-Procedure Wound #2 Pre-procedure diagnosis of Wound #2 is a Diabetic Wound/Ulcer of the Lower Extremity located on the Right,Anterior T Second .Severity of Tissue Pre oe Debridement is: Fat layer exposed. There was a Selective/Open Wound Non-Viable Tissue Debridement with a total area of 0.03 sq cm performed by Maxwell Caul, MD. With the following instrument(s): Curette to remove Viable and Non-Viable tissue/material. Material removed includes Eschar. No specimens were taken. A time out was conducted at 12:48, prior to the start of the procedure. A Minimum amount of bleeding was controlled with Pressure. The procedure was  tolerated well with a pain level of 0 throughout and a pain level of 0 following the procedure. Post Debridement Measurements: 0.2cm length x 0.2cm width x 0.1cm depth; 0.003cm^3 volume. Character of Wound/Ulcer Post Debridement is stable. Severity of Tissue Post Debridement is: Fat layer exposed. Post procedure Diagnosis Wound #2: Same as Pre-Procedure Plan Follow-up Appointments: Return Appointment in 2 weeks. Bathing/ Shower/ Hygiene: Wash wounds with antibacterial soap and water. Edema Control - Orders / Instructions: Tubigrip single layer applied. - D Bilateral WOUND #1: - Lower Leg Wound Laterality: Right, Lateral Cleanser: Soap and Water 3 x Per Week/30 Days Discharge Instructions: Gently cleanse wound with antibacterial soap, rinse and pat dry prior to dressing wounds Prim Dressing: Hydrofera Blue Ready Transfer Foam, 2.5x2.5 (in/in) 3 x Per Week/30 Days ary Discharge Instructions: Apply Hydrofera Blue Ready to wound bed as directed Secondary Dressing: Coverlet Latex-Free Fabric Adhesive Dressings 3 x Per Week/30 Days Discharge Instructions: 2 x 3 3/4 Secured With: Tubigrip Size D, 3x10 (in/yd) 3 x Per Week/30 Days WOUND #2: - T Second Wound Laterality: Right, Anterior oe Cleanser: Soap and Water 3 x Per Week/30 Days Discharge Instructions: Gently cleanse wound with antibacterial soap, rinse and pat dry prior to dressing wounds Prim Dressing: Hydrofera Blue Ready Transfer Foam, 2.5x2.5 (in/in) 3 x Per Week/30 Days ary Discharge Instructions: Apply Hydrofera Blue Ready to wound bed as directed Secondary Dressing: Coverlet Latex-Free Fabric Adhesive Dressings 3 x Per Week/30 Days Discharge Instructions: 1.5 x 2 1. We have continued with the Hydrofera Blue and Tubigrip. He is changing this every second day. 2. Although he has considerable medical issues I saw no evidence of systemic fluid volume overload and no major edema in his right leg. Electronic Signature(s) Signed:  12/25/2023 12:26:56 PM By: Baltazar Najjar MD Entered By: Baltazar Najjar on 12/24/2023 10:05:21 Leeroy Bock A ([id number]) [id number] Page 8 of 8 -------------------------------------------------------------------------------- SuperBill Details Patient Name: Date of Service: [name] 12/24/2023 Medical Record Number: [id number] Patient Account Number: [id number] Date of Birth/Sex: Treating RN: 04-24-1981 (42 y.o. Roel Cluck Primary Care Provider: Gillermo Murdoch Other Clinician: Referring Provider: Treating Provider/Extender: RO BSO N, MICHA EL Margaree Mackintosh, Ahmed Weeks in Treatment: 2 Diagnosis Coding ICD-10 Codes Code Description [id number] Type 2 diabetes mellitus with other skin ulcer L97.812 Non-pressure chronic ulcer of other part of right lower leg with fat layer exposed E11.621 Type 2 diabetes mellitus with foot ulcer L97.512 Non-pressure chronic ulcer of other  part of right foot with fat layer exposed I50.42 Chronic combined systolic (congestive) and diastolic (congestive) heart failure N18.6 End stage renal disease Z99.2 Dependence on renal dialysis Facility Procedures : CPT4 Code: [id number] Description: 7597 - DEBRIDE WOUND 1ST 20 SQ CM OR < ICD-10 Diagnosis Description L97.812 Non-pressure chronic ulcer of other part of right lower leg with fat layer expos L97.512 Non-pressure chronic ulcer of other part of right foot with fat layer  exposed Modifier: ed Quantity: 1 Physician Procedures : CPT4 Code Description Modifier [id number] - WC PHYS DEBR WO ANESTH 20 SQ CM ICD-10 Diagnosis Description L97.812 Non-pressure chronic ulcer of other part of right lower leg with fat layer exposed L97.512 Non-pressure chronic ulcer of other part of  right foot with fat layer exposed Quantity: 1 Electronic Signature(s) Signed: 12/25/2023 12:26:56 PM By: Baltazar Najjar MD Entered By: Baltazar Najjar on 12/24/2023 10:05:36

## 2023-12-29 ENCOUNTER — Encounter (INDEPENDENT_AMBULATORY_CARE_PROVIDER_SITE_OTHER): Payer: Medicare Other

## 2023-12-29 ENCOUNTER — Ambulatory Visit (INDEPENDENT_AMBULATORY_CARE_PROVIDER_SITE_OTHER): Payer: Medicare Other | Admitting: Vascular Surgery

## 2023-12-30 DIAGNOSIS — Z992 Dependence on renal dialysis: Secondary | ICD-10-CM | POA: Diagnosis not present

## 2023-12-30 DIAGNOSIS — N186 End stage renal disease: Secondary | ICD-10-CM | POA: Diagnosis not present

## 2023-12-31 ENCOUNTER — Other Ambulatory Visit (INDEPENDENT_AMBULATORY_CARE_PROVIDER_SITE_OTHER): Payer: Self-pay | Admitting: Nurse Practitioner

## 2023-12-31 DIAGNOSIS — N186 End stage renal disease: Secondary | ICD-10-CM

## 2024-01-01 DIAGNOSIS — N186 End stage renal disease: Secondary | ICD-10-CM | POA: Diagnosis not present

## 2024-01-01 DIAGNOSIS — Z992 Dependence on renal dialysis: Secondary | ICD-10-CM | POA: Diagnosis not present

## 2024-01-04 DIAGNOSIS — N186 End stage renal disease: Secondary | ICD-10-CM | POA: Diagnosis not present

## 2024-01-04 DIAGNOSIS — Z992 Dependence on renal dialysis: Secondary | ICD-10-CM | POA: Diagnosis not present

## 2024-01-05 ENCOUNTER — Ambulatory Visit (INDEPENDENT_AMBULATORY_CARE_PROVIDER_SITE_OTHER): Payer: Medicare HMO | Admitting: Nurse Practitioner

## 2024-01-05 ENCOUNTER — Encounter (INDEPENDENT_AMBULATORY_CARE_PROVIDER_SITE_OTHER): Payer: Self-pay | Admitting: Vascular Surgery

## 2024-01-05 ENCOUNTER — Ambulatory Visit (INDEPENDENT_AMBULATORY_CARE_PROVIDER_SITE_OTHER): Payer: Medicare HMO

## 2024-01-05 VITALS — BP 170/94 | HR 76 | Resp 16 | Wt 151.2 lb

## 2024-01-05 DIAGNOSIS — L97812 Non-pressure chronic ulcer of other part of right lower leg with fat layer exposed: Secondary | ICD-10-CM | POA: Diagnosis not present

## 2024-01-05 DIAGNOSIS — I10 Essential (primary) hypertension: Secondary | ICD-10-CM

## 2024-01-05 DIAGNOSIS — N186 End stage renal disease: Secondary | ICD-10-CM | POA: Diagnosis not present

## 2024-01-05 DIAGNOSIS — L97509 Non-pressure chronic ulcer of other part of unspecified foot with unspecified severity: Secondary | ICD-10-CM | POA: Diagnosis not present

## 2024-01-05 DIAGNOSIS — E11621 Type 2 diabetes mellitus with foot ulcer: Secondary | ICD-10-CM

## 2024-01-05 DIAGNOSIS — S91309A Unspecified open wound, unspecified foot, initial encounter: Secondary | ICD-10-CM

## 2024-01-06 DIAGNOSIS — Z992 Dependence on renal dialysis: Secondary | ICD-10-CM | POA: Diagnosis not present

## 2024-01-06 DIAGNOSIS — N186 End stage renal disease: Secondary | ICD-10-CM | POA: Diagnosis not present

## 2024-01-07 ENCOUNTER — Encounter: Payer: Medicare HMO | Attending: Physician Assistant | Admitting: Physician Assistant

## 2024-01-07 DIAGNOSIS — L97512 Non-pressure chronic ulcer of other part of right foot with fat layer exposed: Secondary | ICD-10-CM | POA: Diagnosis not present

## 2024-01-07 DIAGNOSIS — E11621 Type 2 diabetes mellitus with foot ulcer: Secondary | ICD-10-CM | POA: Insufficient documentation

## 2024-01-07 DIAGNOSIS — I5042 Chronic combined systolic (congestive) and diastolic (congestive) heart failure: Secondary | ICD-10-CM | POA: Insufficient documentation

## 2024-01-07 DIAGNOSIS — N186 End stage renal disease: Secondary | ICD-10-CM | POA: Diagnosis not present

## 2024-01-07 DIAGNOSIS — E1122 Type 2 diabetes mellitus with diabetic chronic kidney disease: Secondary | ICD-10-CM | POA: Insufficient documentation

## 2024-01-07 DIAGNOSIS — Z992 Dependence on renal dialysis: Secondary | ICD-10-CM | POA: Diagnosis not present

## 2024-01-07 DIAGNOSIS — L97812 Non-pressure chronic ulcer of other part of right lower leg with fat layer exposed: Secondary | ICD-10-CM | POA: Diagnosis not present

## 2024-01-07 DIAGNOSIS — E11622 Type 2 diabetes mellitus with other skin ulcer: Secondary | ICD-10-CM | POA: Insufficient documentation

## 2024-01-07 NOTE — Progress Notes (Addendum)
 ZAREK, RELPH [name] ([id number]) [id number] Page 1 of 6 Visit Report for 01/07/2024 Chief Complaint Document Details Patient Name: Date of Service: [name] Mark [name] Mark [name] 01/07/2024 1:15 PM Medical Record Number: [id number] Patient Account Number: [id number] Date of Birth/Sex: Treating RN: 09-13-1981 (43 y.o. Mark [name] Primary Care Provider: Albina [name] Other Clinician: Referring Provider: Treating Provider/Extender: Mark [name] Mark [name] Mark [name] in Treatment: 4 Information Obtained from: Patient Chief Complaint Right leg and right foot ulcers Electronic Signature(s) Signed: 01/07/2024 1:24:26 PM By: Mark Andre PA-C Entered By: Mark [name] on 01/07/2024 13:24:25 -------------------------------------------------------------------------------- HPI Details Patient Name: Date of Service: [name] Mark [name] Mark [name]. 01/07/2024 1:15 PM Medical Record Number: [id number] Patient Account Number: [id number] Date of Birth/Sex: Treating RN: [birth year] (43 y.o. Mark [name] Primary Care Provider: Albina [name] Other Clinician: Referring Provider: Treating Provider/Extender: Mark [name] Mark [name] Mark [name] in Treatment: 4 History of Present Illness Chronic/Inactive Conditions Condition 1: 12-10-2023 patient's ABIs here in the clinic were 1.3 on the right and noncompressible on the descending for formal arterial studies in order to evaluate the situation here. HPI Description: 12-10-2023 upon evaluation today patient appears to be doing well currently in regard to his wounds which he is seen today for initial evaluation here in the clinic with regard to. He has [name] wound on his right leg as well as his right second toe on the dorsal aspect. With that being said he tells me that these wounds have been present since summer around July or August. This is quite [name] long time considering how small they appear. He does have diabetes. He is on medication for this regularly. He  also has end-stage renal disease is on dialysis Monday Wednesdays and Fridays. With that being said I think all this is contributing to some of the issues she is having with swelling which I think is contributing to his healing as well. There is also some question of his arterial flow unfortunately he was noncompressible on evaluation here in the clinic today and subsequently I think that he is going to require an arterial study with ABI and TBI to be done promptly in order to evaluate and see where things stand here. He voiced understanding. Patient's most recent hemoglobin A1c was 6.4 and that was on 11-06-2023. He also has [name] history of congestive heart failure, end-stage renal disease for which he is on dialysis, and cataract issues which she is actually having surgery on next Thursday. 12/26; this patient admitted to the clinic 2 weeks ago. He has wounds on his right lateral lower leg and on the proximal right second toe. We have been using Hydrofera Blue with [name] Tubigrip. 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 ABI in our clinic was 1.3 Mark [name] [name] ([id number]) [phone].pdf Page 2 of 6 01-07-24 upon evaluation today patient appears to be doing well currently in regard to his wound. He is actually been tolerating the dressing changes and the good news is he appears to be completely healed. I do not see any signs of active infection which is great news and overall do believe that making excellent headway here towards closure. Electronic Signature(s) Signed: 01/07/2024 2:11:45 PM By: Mark Andre PA-C Entered By: Mark [name] on 01/07/2024 14:11:45 -------------------------------------------------------------------------------- Physical Exam Details Patient Name: Date of Service: [name] Mark [name] Mark [name] 01/07/2024 1:15 PM Medical Record Number: [id number] Patient Account Number:  [id number] Date of Birth/Sex: Treating RN: Dec 05, 1981 (43 y.o.  Mark [name] Primary Care Provider: Albina [name] Other Clinician: Referring Provider: Treating Provider/Extender: Mark [name] Mark, Mark [name] Weeks in Treatment: 4 Constitutional Well-nourished and well-hydrated in no acute distress. Respiratory normal breathing without difficulty. Psychiatric this patient is able to make decisions and demonstrates good insight into disease process. Alert and Oriented x 3. pleasant and cooperative. Notes Upon inspection patient's wound bed showed evidence of good granulation and epithelization in fact I feel like the wound is completely closed I do not see any openings at either location and overall he seems to be doing great his ABIs were also excellent and we looked this up. Electronic Signature(s) Signed: 01/07/2024 2:11:59 PM By: Mark Andre PA-C Entered By: Mark [name] on 01/07/2024 14:11:59 -------------------------------------------------------------------------------- Physician Orders Details Patient Name: Date of Service: [name] Mark [name] Mark [name]. 01/07/2024 1:15 PM Medical Record Number: [id number] Patient Account Number: [id number] Date of Birth/Sex: Treating RN: 24-Oct-1981 (43 y.o. Mark [name] Primary Care Provider: Albina [name] Other Clinician: Referring Provider: Treating Provider/Extender: Mark [name] Mark [name] Mark [name] in Treatment: 4 The following information was scribed by: Claudene [name] The information was scribed for: Mark [name] Verbal / Phone Orders: No Mark [name], Mark [name] ([id number]) [id number] Page 3 of 6 Diagnosis Coding ICD-10 Coding Code Description E11.622 Type 2 diabetes mellitus with other skin ulcer L97.812 Non-pressure chronic ulcer of other part of right lower leg with fat layer exposed E11.621 Type 2 diabetes mellitus with foot ulcer L97.512 Non-pressure chronic ulcer of other part of right foot with fat layer  exposed I50.42 Chronic combined systolic (congestive) and diastolic (congestive) heart failure N18.6 End stage renal disease Z99.2 Dependence on renal dialysis Discharge From [facility] Services Discharge from Wound Care Center Treatment Complete - Wear compression socks daily. Call if needed Electronic Signature(s) Signed: 01/07/2024 3:53:53 PM By: Mark Andre PA-C Signed: 01/07/2024 4:26:36 PM By: [name] MSN RN CNS WTA Entered By: Claudene [name] on 01/07/2024 13:52:30 -------------------------------------------------------------------------------- Problem List Details Patient Name: Date of Service: [name] Mark [name] Mark [name]. 01/07/2024 1:15 PM Medical Record Number: [id number] Patient Account Number: [id number] Date of Birth/Sex: Treating RN: [birth year] (42 y.o. Mark [name] Primary Care Provider: Albina [name] Other Clinician: Referring Provider: Treating Provider/Extender: Mark [name] Mark [name] Mark [name] in Treatment: 4 Active Problems ICD-10 Encounter Code Description Active Date MDM Diagnosis E11.622 Type 2 diabetes mellitus with other skin ulcer 12/10/2023 No Yes L97.812 Non-pressure chronic ulcer of other part of right lower leg with fat layer 12/10/2023 No Yes exposed E11.621 Type 2 diabetes mellitus with foot ulcer 12/10/2023 No Yes L97.512 Non-pressure chronic ulcer of other part of right foot with fat layer exposed 12/10/2023 No Yes I50.42 Chronic combined systolic (congestive) and diastolic (congestive) heart failure 12/10/2023 No Yes N18.6 End stage renal disease 12/10/2023 No Yes Mark [name], Mark [name] ([id number]) [id number] Page 4 of 6 Z99.2 Dependence on renal dialysis 12/10/2023 No Yes Inactive Problems Resolved Problems Electronic Signature(s) Signed: 01/07/2024 1:24:15 PM By: Mark Andre PA-C Entered By: Mark [name] on 01/07/2024 13:24:15 -------------------------------------------------------------------------------- Progress Note  Details Patient Name: Date of Service: [name] Mark [name] Mark [name]. 01/07/2024 1:15 PM Medical Record Number: [id number] Patient Account Number: [id number] Date of Birth/Sex: Treating RN: 10-May-1981 (42 y.o. Mark [name] Primary Care Provider: Albina [name] Other Clinician: Referring Provider: Treating Provider/Extender: Mark [name] Mark [name] Mark [name] in Treatment: 4 Subjective Chief Complaint Information obtained from Patient Right leg and right foot ulcers History of Present Illness (HPI) Chronic/Inactive Condition: 12-10-2023 patient's ABIs here in the clinic were 1.3 on the right and  noncompressible on the descending for formal arterial studies in order to evaluate the situation here. 12-10-2023 upon evaluation today patient appears to be doing well currently in regard to his wounds which he is seen today for initial evaluation here in the clinic with regard to. He has [name] wound on his right leg as well as his right second toe on the dorsal aspect. With that being said he tells me that these wounds have been present since summer around July or August. This is quite [name] long time considering how small they appear. He does have diabetes. He is on medication for this regularly. He also has end-stage renal disease is on dialysis Monday Wednesdays and Fridays. With that being said I think all this is contributing to some of the issues she is having with swelling which I think is contributing to his healing as well. There is also some question of his arterial flow unfortunately he was noncompressible on evaluation here in the clinic today and subsequently I think that he is going to require an arterial study with ABI and TBI to be done promptly in order to evaluate and see where things stand here. He voiced understanding. Patient's most recent hemoglobin A1c was 6.4 and that was on 11-06-2023. He also has [name] history of congestive heart failure, end-stage renal disease for which he is on dialysis, and  cataract issues which she is actually having surgery on next Thursday. 12/26; this patient admitted to the clinic 2 weeks ago. He has wounds on his right lateral lower leg and on the proximal right second toe. We have been using Hydrofera Blue with [name] Tubigrip. 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 ABI in our clinic was 1.3 01-07-24 upon evaluation today patient appears to be doing well currently in regard to his wound. He is actually been tolerating the dressing changes and the good news is he appears to be completely healed. I do not see any signs of active infection which is great news and overall do believe that making excellent headway here towards closure. Objective Constitutional Well-nourished and well-hydrated in no acute distress. Vitals Time Taken: 1:28 PM, Height: 70 in, Weight: 145 lbs, BMI: 20.8, Temperature: 97.8 F, Pulse: 76 bpm, Respiratory Rate: 18 breaths/min, Blood Pressure: Mark [name], Mark [name] ([id number]) [phone].pdf Page 5 of 6 173/93 mmHg. Respiratory normal breathing without difficulty. Psychiatric this patient is able to make decisions and demonstrates good insight into disease process. Alert and Oriented x 3. pleasant and cooperative. General Notes: Upon inspection patient's wound bed showed evidence of good granulation and epithelization in fact I feel like the wound is completely closed I do not see any openings at either location and overall he seems to be doing great his ABIs were also excellent and we looked this up. Integumentary (Hair, Skin) Wound #1 status is Healed - Epithelialized. Original cause of wound was Gradually Appeared. The date acquired was: 07/29/2023. The wound has been in treatment 4 weeks. The wound is located on the Right,Lateral Lower Leg. The wound measures 0cm length x 0cm width x 0cm depth; 0cm^2 area and 0cm^3 volume. There  is Fat Layer (Subcutaneous Tissue) exposed. There is [name] medium amount of serosanguineous drainage noted. There is medium (34-66%) red granulation within the wound bed. There is no necrotic tissue within the wound bed. Wound #2 status is Healed - Epithelialized. Original cause of wound was Gradually Appeared. The date acquired was: 07/29/2023.  The wound has been in treatment 4 weeks. The wound is located on the Right,Anterior T Second. The wound measures 0cm length x 0cm width x 0cm depth; 0cm^2 area and 0cm^3 oe volume. There is Fat Layer (Subcutaneous Tissue) exposed. There is [name] medium amount of serosanguineous drainage noted. There is medium (34-66%) red granulation within the wound bed. 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 Call if needed 1. I would recommend that we have the patient go to continue to monitor for any signs of infection or worsening based on what I am seeing I think he is healed I think he is doing well we can to see him back for follow-up visit as needed. 2. I am also going to recommend that he should continue to monitor for any signs of infection and if anything changes he knows to contact the office and let me know. We will see the patient back for [name] follow-up visit as needed. Electronic Signature(s) Signed: 01/07/2024 2:12:34 PM By: Mark Ferraris PA-C Entered By: Mark Ferraris on 01/07/2024  14:12:33 -------------------------------------------------------------------------------- SuperBill Details Patient Name: Date of Service: [name] Mark [name] Mark [name]. 01/07/2024 Medical Record Number: [id number] Patient Account Number: [id number] Date of Birth/Sex: Treating RN: 1981/12/12 (42 y.o. 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 N18.6 End stage renal disease Z99.2 Dependence on renal dialysis Facility Procedures : CPT4 Code: [id number] Description: 99213 - WOUND CARE VISIT-LEV 3 EST PT Modifier: Quantity: 1 Physician Procedures : CPT4 Code Description Modifier [id number] - WC PHYS LEVEL 3 - EST PT ICD-10 Diagnosis Description E11.622 Type 2 diabetes mellitus with other skin ulcer L97.812 Non-pressure chronic ulcer of other part of right lower leg with fat layer exposed  E11.621 Type 2 diabetes mellitus with foot ulcer L97.512 Non-pressure chronic ulcer of other part of right foot with fat layer exposed Quantity: 1 Electronic Signature(s) Signed: 01/07/2024 2:18:52 PM By: Mark Ferraris PA-C Entered By: Mark Ferraris on 01/07/2024 14:18:52

## 2024-01-07 NOTE — Progress Notes (Addendum)
 Mark [name], Mark [name] ([id number]) [id number] Page 1 of 10 Visit Report for 01/07/2024 Arrival Information Details Patient Name: Date of Service: [name] RONAL 01/07/2024 1:15 PM Medical Record Number: [id number] Patient Account Number: [id number] Date of Birth/Sex: Treating RN: [birth year] (43 y.o. NETTY Mark [name] Primary Care Cheryel Kyte: Albina Dine Other Clinician: Referring Wyeth Hoffer: Treating Tigran Haynie/Extender: Bethena Andre Albina Dine Devra in Treatment: 4 Visit Information History Since Last Visit Added or deleted any medications: No Patient Arrived: Ambulatory Any new allergies or adverse reactions: No Arrival Time: 13:25 Had [name] fall or experienced change in No Accompanied By: self activities of daily living that may affect Transfer Assistance: None risk of falls: Patient Identification Verified: Yes Signs or symptoms of abuse/neglect since last visito No Secondary Verification Process Completed: Yes Hospitalized since last visit: No Patient Requires Transmission-Based No Has Dressing in Place as Prescribed: Yes Precautions: Pain Present Now: No Patient Has Alerts: Yes Patient Alerts: Diabetic Type 2 R ABI 1.06 L 1.07 01/08/24 Electronic Signature(s) Signed: 01/11/2024 4:40:23 PM By: Mark Blossom MSN RN CNS WTA Previous Signature: 01/07/2024 4:26:36 PM Version By: Mark Blossom MSN RN CNS WTA Entered By: Mark [name] on 01/11/2024 16:40:23 -------------------------------------------------------------------------------- Clinic Level of Care Assessment Details Patient Name: Date of Service: [name] [name]. 01/07/2024 1:15 PM Medical Record Number: [id number] Patient Account Number: [id number] Date of Birth/Sex: Treating RN: 1981-11-24 (43 y.o. NETTY Mark [name] Primary Care Fahd Galea: Albina Dine Other Clinician: Referring Kruze Atchley: Treating Makahla Kiser/Extender: Bethena Andre Albina Dine Devra in Treatment: 4 Clinic Level of Care  Assessment Items TOOL 4 Quantity Score X- 1 0 Use when only an EandM is performed on FOLLOW-UP visit ASSESSMENTS - Nursing Assessment / Reassessment X- 1 10 Reassessment of Co-morbidities (includes updates in patient status) X- 1 5 Reassessment of Adherence to Treatment Plan ASSESSMENTS - Wound and Skin Assessment / Reassessment ROHN, FRITSCH [name] ([id number]) [id number] Page 2 of 10 X- 1 5 Simple Wound Assessment / Reassessment - one wound []  - 0 Complex Wound Assessment / Reassessment - multiple wounds []  - 0 Dermatologic / Skin Assessment (not related to wound area) ASSESSMENTS - Focused Assessment []  - 0 Circumferential Edema Measurements - multi extremities []  - 0 Nutritional Assessment / Counseling / Intervention []  - 0 Lower Extremity Assessment (monofilament, tuning fork, pulses) []  - 0 Peripheral Arterial Disease Assessment (using hand held doppler) ASSESSMENTS - Ostomy and/or Continence Assessment and Care []  - 0 Incontinence Assessment and Management []  - 0 Ostomy Care Assessment and Management (repouching, etc.) PROCESS - Coordination of Care X - Simple Patient / Family Education for ongoing care 1 15 []  - 0 Complex (extensive) Patient / Family Education for ongoing care X- 1 10 Staff obtains Chiropractor, Records, T Results / Process Orders est []  - 0 Staff telephones HHA, Nursing Homes / Clarify orders / etc []  - 0 Routine Transfer to another Facility (non-emergent condition) []  - 0 Routine Hospital Admission (non-emergent condition) []  - 0 New Admissions / Manufacturing Engineer / Ordering NPWT Apligraf, etc. , []  - 0 Emergency Hospital Admission (emergent condition) X- 1 10 Simple Discharge Coordination []  - 0 Complex (extensive) Discharge Coordination PROCESS - Special Needs []  - 0 Pediatric / Minor Patient Management []  - 0 Isolation Patient Management []  - 0 Hearing / Language / Visual special needs []  -  0 Assessment of Community assistance (transportation, D/C planning, etc.) []  - 0 Additional assistance / Altered mentation []  - 0 Support Surface(s) Assessment (bed, cushion, seat, etc.) INTERVENTIONS - Wound Cleansing /  Measurement []  - 0 Simple Wound Cleansing - one wound X- 2 5 Complex Wound Cleansing - multiple wounds X- 1 5 Wound Imaging (photographs - any number of wounds) []  - 0 Wound Tracing (instead of photographs) []  - 0 Simple Wound Measurement - one wound X- 2 5 Complex Wound Measurement - multiple wounds INTERVENTIONS - Wound Dressings []  - 0 Small Wound Dressing one or multiple wounds []  - 0 Medium Wound Dressing one or multiple wounds []  - 0 Large Wound Dressing one or multiple wounds []  - 0 Application of Medications - topical []  - 0 Application of Medications - injection INTERVENTIONS - Miscellaneous []  - 0 External ear exam []  - 0 Specimen Collection (cultures, biopsies, blood, body fluids, etc.) JOURDYN, FERRIN [name] ([id number]) [id number] Page 3 of 10 []  - 0 Specimen(s) / Culture(s) sent or taken to Lab for analysis []  - 0 Patient Transfer (multiple staff / Deitra Lift / Similar devices) []  - 0 Simple Staple / Suture removal (25 or less) []  - 0 Complex Staple / Suture removal (26 or more) []  - 0 Hypo / Hyperglycemic Management (close monitor of Blood Glucose) []  - 0 Ankle / Brachial Index (ABI) - do not check if billed separately X- 1 5 Vital Signs Has the patient been seen at the hospital within the last three years: Yes Total Score: 85 Level Of Care: New/Established - Level 3 Electronic Signature(s) Signed: 01/07/2024 4:26:36 PM By: Mark Blossom MSN RN CNS WTA Entered By: Mark [name] on 01/07/2024 13:53:14 -------------------------------------------------------------------------------- Encounter Discharge Information Details Patient Name: Date of Service: TO Mark CATHIE Mark [name]. 01/07/2024 1:15 PM Medical Record Number:  [id number] Patient Account Number: [id number] Date of Birth/Sex: Treating RN: 08/15/1981 (43 y.o. NETTY Mark [name] Primary Care Kortny Lirette: Albina Dine Other Clinician: Referring Laterrian Hevener: Treating Kinslie Hove/Extender: Bethena Andre Albina Dine Devra in Treatment: 4 Encounter Discharge Information Items Discharge Condition: Stable Ambulatory Status: Ambulatory Discharge Destination: Home Transportation: Private Auto Accompanied By: self Schedule Follow-up Appointment: No Clinical Summary of Care: Electronic Signature(s) Signed: 01/07/2024 4:26:36 PM By: Mark Blossom MSN RN CNS WTA Entered By: Mark [name] on 01/07/2024 13:54:50 -------------------------------------------------------------------------------- Lower Extremity Assessment Details Patient Name: Date of Service: TO Mark CATHIE Mark [name]. 01/07/2024 1:15 PM Medical Record Number: [id number] Patient Account Number: [id number] ARKEL, CARTWRIGHT [name] ([id number]) [phone].pdf Page 4 of 10 Date of Birth/Sex: Treating RN: 01/17/1981 (42 y.o. NETTY Mark [name] Primary Care Lakendrick Paradis: Other Clinician: Albina Dine Referring Avigdor Dollar: Treating Aron Needles/Extender: Bethena Andre Albina, Ahmed Weeks in Treatment: 4 Edema Assessment Assessed: Colletta: No] Glenis: No] [Left: Edema] [Right: :] Calf Left: Right: Point of Measurement: 30 cm From Medial Instep 33.5 cm Ankle Left: Right: Point of Measurement: 12 cm From Medial Instep 24 cm Knee To Floor Left: Right: From Medial Instep 35 cm Vascular Assessment Pulses: Dorsalis Pedis Palpable: [Right:Yes] Extremity colors, hair growth, and conditions: Extremity Color: [Right:Red] Hair Growth on Extremity: [Right:Yes] Temperature of Extremity: [Right:Warm] Capillary Refill: [Right:< 3 seconds] Dependent Rubor: [Right:No] Blanched when Elevated: [Right:No No] Toe Nail Assessment Left: Right: Thick: Yes Discolored: Yes Deformed: No Improper Length and Hygiene:  No Electronic Signature(s) Signed: 01/07/2024 4:26:36 PM By: Mark Blossom MSN RN CNS WTA Entered By: Mark [name] on 01/07/2024 13:51:42 -------------------------------------------------------------------------------- Multi Wound Chart Details Patient Name: Date of Service: TO Mark CATHIE Mark [name]. 01/07/2024 1:15 PM Medical Record Number: [id number] Patient Account Number: [id number] Date of Birth/Sex: Treating RN: [birth year] (42 y.o. NETTY Mark [name] Primary Care Arlo Butt: Albina Dine Other Clinician: Referring Diona Peregoy: Treating Filimon Miranda/Extender: Bethena Andre  Tejan-Sie, Ahmed Weeks in Treatment: 4 Vital Signs Height(in): 70 Pulse(bpm): 76 Weight(lbs): 145 Blood Pressure(mmHg): 173/93 CONLIN, BRAHM [name] ([id number]) [id number] Page 5 of 10 Body Mass Index(BMI): 20.8 Temperature(F): 97.8 Respiratory Rate(breaths/min): 18 [1:Photos:] [N/[name]:N/[name]] Right, Lateral Lower Leg Right, Anterior T Second oe N/[name] Wound Location: Gradually Appeared Gradually Appeared N/[name] Wounding Event: Diabetic Wound/Ulcer of the Lower Diabetic Wound/Ulcer of the Lower N/[name] Primary Etiology: Extremity Extremity Congestive Heart Failure, Congestive Heart Failure, N/[name] Comorbid History: Hypertension, Type II Diabetes, End Hypertension, Type II Diabetes, End Stage Renal Disease, Neuropathy Stage Renal Disease, Neuropathy 07/29/2023 07/29/2023 N/[name] Date Acquired: 4 4 N/[name] Weeks of Treatment: Healed - Epithelialized Healed - Epithelialized N/[name] Wound Status: No No N/[name] Wound Recurrence: 0x0x0 0x0x0 N/[name] Measurements L x W x D (cm) 0 0 N/[name] [name] (cm) : rea 0 0 N/[name] Volume (cm) : 100.00% 100.00% N/[name] % Reduction in [name] rea: 100.00% 100.00% N/[name] % Reduction in Volume: Grade 2 Grade 2 N/[name] Classification: Medium Medium N/[name] Exudate [name] mount: Serosanguineous Serosanguineous N/[name] Exudate Type: red, brown red, brown N/[name] Exudate Color: Medium (34-66%) Medium (34-66%) N/[name] Granulation [name] mount: Red  Red N/[name] Granulation Quality: None Present (0%) N/[name] N/[name] Necrotic [name] mount: Fat Layer (Subcutaneous Tissue): Yes Fat Layer (Subcutaneous Tissue): Yes N/[name] Exposed Structures: Fascia: No Fascia: No Tendon: No Tendon: No Muscle: No Muscle: No Joint: No Joint: No Bone: No Bone: No Treatment Notes Electronic Signature(s) Signed: 01/07/2024 4:26:36 PM By: Mark Blossom MSN RN CNS WTA Entered By: Mark [name] on 01/07/2024 13:51:46 -------------------------------------------------------------------------------- Multi-Disciplinary Care Plan Details Patient Name: Date of Service: TO Mark CATHIE Mark [name]. 01/07/2024 1:15 PM Medical Record Number: [id number] Patient Account Number: [id number] Date of Birth/Sex: Treating RN: 1981-07-03 (42 y.o. NETTY Mark [name] Primary Care Nolene Rocks: Albina Dine Other Clinician: Referring Deetya Drouillard: Treating Aaryanna Hyden/Extender: Bethena Andre Albina, Ahmed Weeks in Treatment: [address] GRAESYN, SCHREIFELS [name] ([id number]) [id number] Page 6 of 10 Electronic Signature(s) Signed: 01/07/2024 4:26:36 PM By: Mark Blossom MSN RN CNS WTA Entered By: Mark [name] on 01/07/2024 13:53:31 -------------------------------------------------------------------------------- Pain Assessment Details Patient Name: Date of Service: TO Mark CATHIE Mark [name]. 01/07/2024 1:15 PM Medical Record Number: [id number] Patient Account Number: [id number] Date of Birth/Sex: Treating RN: March 04, 1981 (42 y.o. NETTY Mark [name] Primary Care Cyd Hostler: Albina Dine Other Clinician: Referring Tru Leopard: Treating Tahmir Kleckner/Extender: Bethena Andre Albina Dine Devra in Treatment: 4 Active Problems Location of Pain Severity and Description of Pain Patient Has Paino No Site Locations Pain Management and Medication Current Pain Management: Electronic Signature(s) Signed: 01/07/2024 4:26:36 PM By: Mark Blossom MSN RN CNS WTA Entered By: Mark [name] on 01/07/2024  13:34:09 Patient/Caregiver Education Details -------------------------------------------------------------------------------- TERRIL HACKER LABOR ([id number]) [id number] Page 7 of 10 Patient Name: Date of Service: TO Mark CATHIE Mark RONAL 1/9/2025andnbsp1:15 PM Medical Record Number: [id number] Patient Account Number: [id number] Date of Birth/Gender: Treating RN: 03-25-1981 (43 y.o. NETTY Mark [name] Primary Care Physician: Albina Dine Other Clinician: Referring Physician: Treating Physician/Extender: Bethena Andre Albina Dine Devra in Treatment: 4 Education Assessment Education Provided To: Patient Education Topics Provided Discharge Packet: Handouts: Diabetes and Diabetic Ulcers, General Foot Care, Neuropathy Methods: Explain/Verbal Responses: State content correctly Electronic Signature(s) Signed: 01/07/2024 4:26:36 PM By: Mark Blossom MSN RN CNS WTA Entered By: Mark [name] on 01/07/2024 13:54:10 -------------------------------------------------------------------------------- Wound Assessment Details Patient Name: Date of Service: TO Mark CATHIE Mark [name]. 01/07/2024 1:15 PM Medical Record Number: [id number] Patient Account Number: [id number] Date of Birth/Sex: Treating RN: 22-Apr-1981 (42 y.o. NETTY Mark [name] Primary Care Nettye Flegal: Albina Dine Other Clinician:  Referring Timira Bieda: Treating Deunte Bledsoe/Extender: Bethena Andre Perry, Ahmed Weeks in Treatment: 4 Wound Status Wound Number: 1 Primary Diabetic Wound/Ulcer of the Lower Extremity Etiology: Wound Location: Right, Lateral Lower Leg Wound Healed - Epithelialized Wounding Event: Gradually Appeared Status: Date Acquired: 07/29/2023 Comorbid Congestive Heart Failure, Hypertension, Type II Diabetes, End Weeks Of Treatment: 4 History: Stage Renal Disease, Neuropathy Clustered Wound: No Photos Wound Measurements Length: (cm) Width: (cm) Depth: (cm) Paiz, Jesse [name] ([id number]) Area: (cm) Volume:  (cm) 0 % Reduction in Area: 100% 0 % Reduction in Volume: 100% 0 [id number] Page 8 of 10 0 0 Wound Description Classification: Grade 2 Exudate Amount: Medium Exudate Type: Serosanguineous Exudate Color: red, brown Foul Odor After Cleansing: No Slough/Fibrino No Wound Bed Granulation Amount: Medium (34-66%) Exposed Structure Granulation Quality: Red Fascia Exposed: No Necrotic Amount: None Present (0%) Fat Layer (Subcutaneous Tissue) Exposed: Yes Tendon Exposed: No Muscle Exposed: No Joint Exposed: No Bone Exposed: No Treatment Notes Wound #1 (Lower Leg) Wound Laterality: Right, Lateral Cleanser Peri-Wound Care Topical Primary Dressing Secondary Dressing Secured With Compression Wrap Compression Stockings Add-Ons Electronic Signature(s) Signed: 01/07/2024 4:26:36 PM By: Mark Blossom MSN RN CNS WTA Entered By: Mark [name] on 01/07/2024 13:46:55 -------------------------------------------------------------------------------- Wound Assessment Details Patient Name: Date of Service: TO Mark CATHIE Mark [name]. 01/07/2024 1:15 PM Medical Record Number: [id number] Patient Account Number: [id number] Date of Birth/Sex: Treating RN: 01/07/1981 (42 y.o. NETTY Mark [name] Primary Care Coree Riester: Perry Dine Other Clinician: Referring Purva Vessell: Treating Aniqa Hare/Extender: Bethena Andre Perry, Ahmed Weeks in Treatment: 4 Wound Status Wound Number: 2 Primary Diabetic Wound/Ulcer of the Lower Extremity Etiology: Wound Location: Right, Anterior T Second oe Wound Healed - Epithelialized Wounding Event: Gradually Appeared Status: Date Acquired: 07/29/2023 Comorbid Congestive Heart Failure, Hypertension, Type II Diabetes, End Weeks Of Treatment: 4 History: Stage Renal Disease, Neuropathy Clustered Wound: No Photos BRADY, SCHILLER [name] ([id number]) [id number] Page 9 of 10 Wound Measurements Length: (cm) Width: (cm) Depth:  (cm) Area: (cm) Volume: (cm) 0 % Reduction in Area: 100% 0 % Reduction in Volume: 100% 0 0 0 Wound Description Classification: Grade 2 Exudate Amount: Medium Exudate Type: Serosanguineous Exudate Color: red, brown Foul Odor After Cleansing: No Slough/Fibrino No Wound Bed Granulation Amount: Medium (34-66%) Exposed Structure Granulation Quality: Red Fascia Exposed: No Fat Layer (Subcutaneous Tissue) Exposed: Yes Tendon Exposed: No Muscle Exposed: No Joint Exposed: No Bone Exposed: No Treatment Notes Wound #2 (Toe Second) Wound Laterality: Right, Anterior Cleanser Peri-Wound Care Topical Primary Dressing Secondary Dressing Secured With Compression Wrap Compression Stockings Add-Ons Electronic Signature(s) Signed: 01/07/2024 4:26:36 PM By: Mark Blossom MSN RN CNS WTA Entered By: Mark [name] on 01/07/2024 13:51:37 Vitals Details -------------------------------------------------------------------------------- TERRIL HACKER LABOR ([id number]) [id number] Page 10 of 10 Patient Name: Date of Service: TO Mark CATHIE Mark RONAL 01/07/2024 1:15 PM Medical Record Number: [id number] Patient Account Number: [id number] Date of Birth/Sex: Treating RN: [birth year] (42 y.o. NETTY Mark [name] Primary Care Zanobia Griebel: Perry Dine Other Clinician: Referring Lyrick Worland: Treating Ieasha Boerema/Extender: Bethena Andre Perry, Ahmed Weeks in Treatment: 4 Vital Signs Time Taken: 13:28 Temperature (F): 97.8 Height (in): 70 Pulse (bpm): 76 Weight (lbs): 145 Respiratory Rate (breaths/min): 18 Body Mass Index (BMI): 20.8 Blood Pressure (mmHg): 173/93 Reference Range: 80 - 120 mg / dl Electronic Signature(s) Signed: 01/07/2024 4:26:36 PM By: Mark Blossom MSN RN CNS WTA Entered By: Mark [name] on 01/07/2024 13:33:55

## 2024-01-08 DIAGNOSIS — N186 End stage renal disease: Secondary | ICD-10-CM | POA: Diagnosis not present

## 2024-01-08 DIAGNOSIS — Z992 Dependence on renal dialysis: Secondary | ICD-10-CM | POA: Diagnosis not present

## 2024-01-11 DIAGNOSIS — Z992 Dependence on renal dialysis: Secondary | ICD-10-CM | POA: Diagnosis not present

## 2024-01-11 DIAGNOSIS — N186 End stage renal disease: Secondary | ICD-10-CM | POA: Diagnosis not present
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
## 2024-01-12 ENCOUNTER — Encounter (INDEPENDENT_AMBULATORY_CARE_PROVIDER_SITE_OTHER): Payer: Self-pay | Admitting: Nurse Practitioner

## 2024-01-12 DIAGNOSIS — H5440 Blindness, one eye, unspecified eye: Secondary | ICD-10-CM | POA: Diagnosis not present

## 2024-01-12 DIAGNOSIS — Z0100 Encounter for examination of eyes and vision without abnormal findings: Secondary | ICD-10-CM | POA: Diagnosis not present

## 2024-01-12 DIAGNOSIS — E113592 Type 2 diabetes mellitus with proliferative diabetic retinopathy without macular edema, left eye: Secondary | ICD-10-CM | POA: Diagnosis not present

## 2024-01-12 DIAGNOSIS — H348122 Central retinal vein occlusion, left eye, stable: Secondary | ICD-10-CM | POA: Diagnosis not present

## 2024-01-13 DIAGNOSIS — N186 End stage renal disease: Secondary | ICD-10-CM | POA: Diagnosis not present

## 2024-01-13 DIAGNOSIS — Z992 Dependence on renal dialysis: Secondary | ICD-10-CM | POA: Diagnosis not present

## 2024-01-14 DIAGNOSIS — Z992 Dependence on renal dialysis: Secondary | ICD-10-CM | POA: Diagnosis not present

## 2024-01-14 DIAGNOSIS — N186 End stage renal disease: Secondary | ICD-10-CM | POA: Diagnosis not present

## 2024-01-15 DIAGNOSIS — N186 End stage renal disease: Secondary | ICD-10-CM | POA: Diagnosis not present

## 2024-01-15 DIAGNOSIS — Z992 Dependence on renal dialysis: Secondary | ICD-10-CM | POA: Diagnosis not present

## 2024-01-18 DIAGNOSIS — Z992 Dependence on renal dialysis: Secondary | ICD-10-CM | POA: Diagnosis not present

## 2024-01-18 DIAGNOSIS — N186 End stage renal disease: Secondary | ICD-10-CM | POA: Diagnosis not present

## 2024-01-22 DIAGNOSIS — Z992 Dependence on renal dialysis: Secondary | ICD-10-CM | POA: Diagnosis not present

## 2024-01-22 DIAGNOSIS — N186 End stage renal disease: Secondary | ICD-10-CM | POA: Diagnosis not present

## 2024-01-25 DIAGNOSIS — Z79899 Other long term (current) drug therapy: Secondary | ICD-10-CM | POA: Diagnosis not present

## 2024-01-25 DIAGNOSIS — N186 End stage renal disease: Secondary | ICD-10-CM | POA: Diagnosis not present

## 2024-01-25 DIAGNOSIS — Z992 Dependence on renal dialysis: Secondary | ICD-10-CM | POA: Diagnosis not present

## 2024-01-25 DIAGNOSIS — E785 Hyperlipidemia, unspecified: Secondary | ICD-10-CM | POA: Diagnosis not present

## 2024-01-25 DIAGNOSIS — E1122 Type 2 diabetes mellitus with diabetic chronic kidney disease: Secondary | ICD-10-CM | POA: Diagnosis not present

## 2024-01-27 DIAGNOSIS — N186 End stage renal disease: Secondary | ICD-10-CM | POA: Diagnosis not present

## 2024-01-27 DIAGNOSIS — Z992 Dependence on renal dialysis: Secondary | ICD-10-CM | POA: Diagnosis not present

## 2024-01-29 DIAGNOSIS — Z992 Dependence on renal dialysis: Secondary | ICD-10-CM | POA: Diagnosis not present

## 2024-01-29 DIAGNOSIS — N186 End stage renal disease: Secondary | ICD-10-CM | POA: Diagnosis not present

## 2024-01-30 DIAGNOSIS — N186 End stage renal disease: Secondary | ICD-10-CM | POA: Diagnosis not present

## 2024-01-30 DIAGNOSIS — Z992 Dependence on renal dialysis: Secondary | ICD-10-CM | POA: Diagnosis not present

## 2024-02-01 DIAGNOSIS — N186 End stage renal disease: Secondary | ICD-10-CM | POA: Diagnosis not present

## 2024-02-01 DIAGNOSIS — Z992 Dependence on renal dialysis: Secondary | ICD-10-CM | POA: Diagnosis not present

## 2024-02-02 DIAGNOSIS — I5022 Chronic systolic (congestive) heart failure: Secondary | ICD-10-CM | POA: Diagnosis not present

## 2024-02-03 DIAGNOSIS — N186 End stage renal disease: Secondary | ICD-10-CM | POA: Diagnosis not present

## 2024-02-03 DIAGNOSIS — Z992 Dependence on renal dialysis: Secondary | ICD-10-CM | POA: Diagnosis not present

## 2024-02-05 DIAGNOSIS — Z992 Dependence on renal dialysis: Secondary | ICD-10-CM | POA: Diagnosis not present

## 2024-02-05 DIAGNOSIS — N186 End stage renal disease: Secondary | ICD-10-CM | POA: Diagnosis not present

## 2024-02-08 DIAGNOSIS — N186 End stage renal disease: Secondary | ICD-10-CM | POA: Diagnosis not present

## 2024-02-08 DIAGNOSIS — Z992 Dependence on renal dialysis: Secondary | ICD-10-CM | POA: Diagnosis not present

## 2024-02-10 DIAGNOSIS — N186 End stage renal disease: Secondary | ICD-10-CM | POA: Diagnosis not present

## 2024-02-10 DIAGNOSIS — Z992 Dependence on renal dialysis: Secondary | ICD-10-CM | POA: Diagnosis not present

## 2024-02-12 DIAGNOSIS — N186 End stage renal disease: Secondary | ICD-10-CM | POA: Diagnosis not present

## 2024-02-12 DIAGNOSIS — Z992 Dependence on renal dialysis: Secondary | ICD-10-CM | POA: Diagnosis not present

## 2024-02-15 DIAGNOSIS — N186 End stage renal disease: Secondary | ICD-10-CM | POA: Diagnosis not present

## 2024-02-15 DIAGNOSIS — Z992 Dependence on renal dialysis: Secondary | ICD-10-CM | POA: Diagnosis not present

## 2024-02-17 DIAGNOSIS — N186 End stage renal disease: Secondary | ICD-10-CM | POA: Diagnosis not present

## 2024-02-17 DIAGNOSIS — Z992 Dependence on renal dialysis: Secondary | ICD-10-CM | POA: Diagnosis not present

## 2024-02-19 DIAGNOSIS — N186 End stage renal disease: Secondary | ICD-10-CM | POA: Diagnosis not present

## 2024-02-19 DIAGNOSIS — Z992 Dependence on renal dialysis: Secondary | ICD-10-CM | POA: Diagnosis not present

## 2024-02-22 DIAGNOSIS — Z992 Dependence on renal dialysis: Secondary | ICD-10-CM | POA: Diagnosis not present

## 2024-02-22 DIAGNOSIS — N186 End stage renal disease: Secondary | ICD-10-CM | POA: Diagnosis not present

## 2024-02-24 DIAGNOSIS — Z992 Dependence on renal dialysis: Secondary | ICD-10-CM | POA: Diagnosis not present

## 2024-02-24 DIAGNOSIS — N186 End stage renal disease: Secondary | ICD-10-CM | POA: Diagnosis not present

## 2024-02-26 ENCOUNTER — Ambulatory Visit: Payer: Medicare Other | Admitting: Internal Medicine

## 2024-02-26 DIAGNOSIS — Z992 Dependence on renal dialysis: Secondary | ICD-10-CM | POA: Diagnosis not present

## 2024-02-26 DIAGNOSIS — N186 End stage renal disease: Secondary | ICD-10-CM | POA: Diagnosis not present

## 2024-02-29 DIAGNOSIS — Z992 Dependence on renal dialysis: Secondary | ICD-10-CM | POA: Diagnosis not present

## 2024-02-29 DIAGNOSIS — N186 End stage renal disease: Secondary | ICD-10-CM | POA: Diagnosis not present

## 2024-03-01 DIAGNOSIS — I132 Hypertensive heart and chronic kidney disease with heart failure and with stage 5 chronic kidney disease, or end stage renal disease: Secondary | ICD-10-CM | POA: Diagnosis not present

## 2024-03-01 DIAGNOSIS — E13319 Other specified diabetes mellitus with unspecified diabetic retinopathy without macular edema: Secondary | ICD-10-CM | POA: Diagnosis not present

## 2024-03-01 DIAGNOSIS — E1122 Type 2 diabetes mellitus with diabetic chronic kidney disease: Secondary | ICD-10-CM | POA: Diagnosis not present

## 2024-03-01 DIAGNOSIS — I5022 Chronic systolic (congestive) heart failure: Secondary | ICD-10-CM | POA: Diagnosis not present

## 2024-03-01 DIAGNOSIS — I10 Essential (primary) hypertension: Secondary | ICD-10-CM | POA: Diagnosis not present

## 2024-03-01 DIAGNOSIS — Z794 Long term (current) use of insulin: Secondary | ICD-10-CM | POA: Diagnosis not present

## 2024-03-01 DIAGNOSIS — N186 End stage renal disease: Secondary | ICD-10-CM | POA: Diagnosis not present

## 2024-03-01 DIAGNOSIS — Z992 Dependence on renal dialysis: Secondary | ICD-10-CM | POA: Diagnosis not present

## 2024-03-02 DIAGNOSIS — N186 End stage renal disease: Secondary | ICD-10-CM | POA: Diagnosis not present

## 2024-03-02 DIAGNOSIS — Z992 Dependence on renal dialysis: Secondary | ICD-10-CM | POA: Diagnosis not present

## 2024-03-04 DIAGNOSIS — N186 End stage renal disease: Secondary | ICD-10-CM | POA: Diagnosis not present

## 2024-03-04 DIAGNOSIS — Z992 Dependence on renal dialysis: Secondary | ICD-10-CM | POA: Diagnosis not present

## 2024-03-07 DIAGNOSIS — Z992 Dependence on renal dialysis: Secondary | ICD-10-CM | POA: Diagnosis not present

## 2024-03-07 DIAGNOSIS — N186 End stage renal disease: Secondary | ICD-10-CM | POA: Diagnosis not present

## 2024-03-09 DIAGNOSIS — Z992 Dependence on renal dialysis: Secondary | ICD-10-CM | POA: Diagnosis not present

## 2024-03-09 DIAGNOSIS — N186 End stage renal disease: Secondary | ICD-10-CM | POA: Diagnosis not present

## 2024-03-11 DIAGNOSIS — N186 End stage renal disease: Secondary | ICD-10-CM | POA: Diagnosis not present

## 2024-03-11 DIAGNOSIS — Z992 Dependence on renal dialysis: Secondary | ICD-10-CM | POA: Diagnosis not present

## 2024-03-14 DIAGNOSIS — N186 End stage renal disease: Secondary | ICD-10-CM | POA: Diagnosis not present

## 2024-03-14 DIAGNOSIS — Z992 Dependence on renal dialysis: Secondary | ICD-10-CM | POA: Diagnosis not present

## 2024-03-16 DIAGNOSIS — Z992 Dependence on renal dialysis: Secondary | ICD-10-CM | POA: Diagnosis not present

## 2024-03-16 DIAGNOSIS — N186 End stage renal disease: Secondary | ICD-10-CM | POA: Diagnosis not present

## 2024-03-18 DIAGNOSIS — Z992 Dependence on renal dialysis: Secondary | ICD-10-CM | POA: Diagnosis not present

## 2024-03-18 DIAGNOSIS — N186 End stage renal disease: Secondary | ICD-10-CM | POA: Diagnosis not present

## 2024-03-21 DIAGNOSIS — Z992 Dependence on renal dialysis: Secondary | ICD-10-CM | POA: Diagnosis not present

## 2024-03-21 DIAGNOSIS — N186 End stage renal disease: Secondary | ICD-10-CM | POA: Diagnosis not present

## 2024-03-23 DIAGNOSIS — Z992 Dependence on renal dialysis: Secondary | ICD-10-CM | POA: Diagnosis not present

## 2024-03-23 DIAGNOSIS — N186 End stage renal disease: Secondary | ICD-10-CM | POA: Diagnosis not present

## 2024-03-25 DIAGNOSIS — Z992 Dependence on renal dialysis: Secondary | ICD-10-CM | POA: Diagnosis not present

## 2024-03-25 DIAGNOSIS — N186 End stage renal disease: Secondary | ICD-10-CM | POA: Diagnosis not present

## 2024-03-28 DIAGNOSIS — Z992 Dependence on renal dialysis: Secondary | ICD-10-CM | POA: Diagnosis not present

## 2024-03-28 DIAGNOSIS — N186 End stage renal disease: Secondary | ICD-10-CM | POA: Diagnosis not present

## 2024-03-30 DIAGNOSIS — Z992 Dependence on renal dialysis: Secondary | ICD-10-CM | POA: Diagnosis not present

## 2024-03-30 DIAGNOSIS — Z23 Encounter for immunization: Secondary | ICD-10-CM | POA: Diagnosis not present

## 2024-03-30 DIAGNOSIS — N186 End stage renal disease: Secondary | ICD-10-CM | POA: Diagnosis not present

## 2024-04-01 DIAGNOSIS — Z992 Dependence on renal dialysis: Secondary | ICD-10-CM | POA: Diagnosis not present

## 2024-04-01 DIAGNOSIS — Z23 Encounter for immunization: Secondary | ICD-10-CM | POA: Diagnosis not present

## 2024-04-01 DIAGNOSIS — N186 End stage renal disease: Secondary | ICD-10-CM | POA: Diagnosis not present

## 2024-04-04 DIAGNOSIS — N186 End stage renal disease: Secondary | ICD-10-CM | POA: Diagnosis not present

## 2024-04-04 DIAGNOSIS — Z23 Encounter for immunization: Secondary | ICD-10-CM | POA: Diagnosis not present

## 2024-04-04 DIAGNOSIS — Z992 Dependence on renal dialysis: Secondary | ICD-10-CM | POA: Diagnosis not present

## 2024-04-06 DIAGNOSIS — Z992 Dependence on renal dialysis: Secondary | ICD-10-CM | POA: Diagnosis not present

## 2024-04-06 DIAGNOSIS — N186 End stage renal disease: Secondary | ICD-10-CM | POA: Diagnosis not present

## 2024-04-06 DIAGNOSIS — Z23 Encounter for immunization: Secondary | ICD-10-CM | POA: Diagnosis not present

## 2024-04-08 DIAGNOSIS — Z23 Encounter for immunization: Secondary | ICD-10-CM | POA: Diagnosis not present

## 2024-04-08 DIAGNOSIS — Z992 Dependence on renal dialysis: Secondary | ICD-10-CM | POA: Diagnosis not present

## 2024-04-08 DIAGNOSIS — N186 End stage renal disease: Secondary | ICD-10-CM | POA: Diagnosis not present

## 2024-04-13 DIAGNOSIS — Z23 Encounter for immunization: Secondary | ICD-10-CM | POA: Diagnosis not present

## 2024-04-13 DIAGNOSIS — N186 End stage renal disease: Secondary | ICD-10-CM | POA: Diagnosis not present

## 2024-04-13 DIAGNOSIS — Z992 Dependence on renal dialysis: Secondary | ICD-10-CM | POA: Diagnosis not present

## 2024-04-15 DIAGNOSIS — N186 End stage renal disease: Secondary | ICD-10-CM | POA: Diagnosis not present

## 2024-04-15 DIAGNOSIS — Z992 Dependence on renal dialysis: Secondary | ICD-10-CM | POA: Diagnosis not present

## 2024-04-15 DIAGNOSIS — Z23 Encounter for immunization: Secondary | ICD-10-CM | POA: Diagnosis not present

## 2024-04-18 DIAGNOSIS — Z23 Encounter for immunization: Secondary | ICD-10-CM | POA: Diagnosis not present

## 2024-04-18 DIAGNOSIS — Z992 Dependence on renal dialysis: Secondary | ICD-10-CM | POA: Diagnosis not present

## 2024-04-18 DIAGNOSIS — N186 End stage renal disease: Secondary | ICD-10-CM | POA: Diagnosis not present

## 2024-04-20 DIAGNOSIS — N186 End stage renal disease: Secondary | ICD-10-CM | POA: Diagnosis not present

## 2024-04-20 DIAGNOSIS — Z23 Encounter for immunization: Secondary | ICD-10-CM | POA: Diagnosis not present

## 2024-04-20 DIAGNOSIS — Z992 Dependence on renal dialysis: Secondary | ICD-10-CM | POA: Diagnosis not present

## 2024-04-22 DIAGNOSIS — Z23 Encounter for immunization: Secondary | ICD-10-CM | POA: Diagnosis not present

## 2024-04-22 DIAGNOSIS — N186 End stage renal disease: Secondary | ICD-10-CM | POA: Diagnosis not present

## 2024-04-22 DIAGNOSIS — Z992 Dependence on renal dialysis: Secondary | ICD-10-CM | POA: Diagnosis not present

## 2024-04-25 DIAGNOSIS — N186 End stage renal disease: Secondary | ICD-10-CM | POA: Diagnosis not present

## 2024-04-25 DIAGNOSIS — Z23 Encounter for immunization: Secondary | ICD-10-CM | POA: Diagnosis not present

## 2024-04-25 DIAGNOSIS — Z992 Dependence on renal dialysis: Secondary | ICD-10-CM | POA: Diagnosis not present

## 2024-04-27 DIAGNOSIS — Z23 Encounter for immunization: Secondary | ICD-10-CM | POA: Diagnosis not present

## 2024-04-27 DIAGNOSIS — N186 End stage renal disease: Secondary | ICD-10-CM | POA: Diagnosis not present

## 2024-04-27 DIAGNOSIS — Z992 Dependence on renal dialysis: Secondary | ICD-10-CM | POA: Diagnosis not present

## 2024-04-29 DIAGNOSIS — Z992 Dependence on renal dialysis: Secondary | ICD-10-CM | POA: Diagnosis not present

## 2024-04-29 DIAGNOSIS — N186 End stage renal disease: Secondary | ICD-10-CM | POA: Diagnosis not present

## 2024-04-29 DIAGNOSIS — Z23 Encounter for immunization: Secondary | ICD-10-CM | POA: Diagnosis not present

## 2024-05-02 DIAGNOSIS — N186 End stage renal disease: Secondary | ICD-10-CM | POA: Diagnosis not present

## 2024-05-02 DIAGNOSIS — Z23 Encounter for immunization: Secondary | ICD-10-CM | POA: Diagnosis not present

## 2024-05-02 DIAGNOSIS — Z992 Dependence on renal dialysis: Secondary | ICD-10-CM | POA: Diagnosis not present

## 2024-05-04 DIAGNOSIS — N186 End stage renal disease: Secondary | ICD-10-CM | POA: Diagnosis not present

## 2024-05-04 DIAGNOSIS — Z23 Encounter for immunization: Secondary | ICD-10-CM | POA: Diagnosis not present

## 2024-05-04 DIAGNOSIS — Z992 Dependence on renal dialysis: Secondary | ICD-10-CM | POA: Diagnosis not present

## 2024-05-04 DIAGNOSIS — E1122 Type 2 diabetes mellitus with diabetic chronic kidney disease: Secondary | ICD-10-CM | POA: Diagnosis not present

## 2024-05-06 DIAGNOSIS — Z992 Dependence on renal dialysis: Secondary | ICD-10-CM | POA: Diagnosis not present

## 2024-05-06 DIAGNOSIS — N186 End stage renal disease: Secondary | ICD-10-CM | POA: Diagnosis not present

## 2024-05-06 DIAGNOSIS — Z23 Encounter for immunization: Secondary | ICD-10-CM | POA: Diagnosis not present

## 2024-05-09 DIAGNOSIS — Z992 Dependence on renal dialysis: Secondary | ICD-10-CM | POA: Diagnosis not present

## 2024-05-09 DIAGNOSIS — N186 End stage renal disease: Secondary | ICD-10-CM | POA: Diagnosis not present

## 2024-05-09 DIAGNOSIS — Z23 Encounter for immunization: Secondary | ICD-10-CM | POA: Diagnosis not present

## 2024-05-11 DIAGNOSIS — Z992 Dependence on renal dialysis: Secondary | ICD-10-CM | POA: Diagnosis not present

## 2024-05-11 DIAGNOSIS — N186 End stage renal disease: Secondary | ICD-10-CM | POA: Diagnosis not present

## 2024-05-11 DIAGNOSIS — Z23 Encounter for immunization: Secondary | ICD-10-CM | POA: Diagnosis not present

## 2024-05-13 DIAGNOSIS — Z23 Encounter for immunization: Secondary | ICD-10-CM | POA: Diagnosis not present

## 2024-05-13 DIAGNOSIS — Z992 Dependence on renal dialysis: Secondary | ICD-10-CM | POA: Diagnosis not present

## 2024-05-13 DIAGNOSIS — N186 End stage renal disease: Secondary | ICD-10-CM | POA: Diagnosis not present

## 2024-05-16 DIAGNOSIS — Z23 Encounter for immunization: Secondary | ICD-10-CM | POA: Diagnosis not present

## 2024-05-16 DIAGNOSIS — Z992 Dependence on renal dialysis: Secondary | ICD-10-CM | POA: Diagnosis not present

## 2024-05-16 DIAGNOSIS — N186 End stage renal disease: Secondary | ICD-10-CM | POA: Diagnosis not present

## 2024-05-18 DIAGNOSIS — Z992 Dependence on renal dialysis: Secondary | ICD-10-CM | POA: Diagnosis not present

## 2024-05-18 DIAGNOSIS — N186 End stage renal disease: Secondary | ICD-10-CM | POA: Diagnosis not present

## 2024-05-18 DIAGNOSIS — Z23 Encounter for immunization: Secondary | ICD-10-CM | POA: Diagnosis not present

## 2024-05-20 DIAGNOSIS — Z23 Encounter for immunization: Secondary | ICD-10-CM | POA: Diagnosis not present

## 2024-05-20 DIAGNOSIS — Z992 Dependence on renal dialysis: Secondary | ICD-10-CM | POA: Diagnosis not present

## 2024-05-20 DIAGNOSIS — N186 End stage renal disease: Secondary | ICD-10-CM | POA: Diagnosis not present

## 2024-05-23 DIAGNOSIS — Z992 Dependence on renal dialysis: Secondary | ICD-10-CM | POA: Diagnosis not present

## 2024-05-23 DIAGNOSIS — Z23 Encounter for immunization: Secondary | ICD-10-CM | POA: Diagnosis not present

## 2024-05-23 DIAGNOSIS — N186 End stage renal disease: Secondary | ICD-10-CM | POA: Diagnosis not present

## 2024-05-25 DIAGNOSIS — Z992 Dependence on renal dialysis: Secondary | ICD-10-CM | POA: Diagnosis not present

## 2024-05-25 DIAGNOSIS — N186 End stage renal disease: Secondary | ICD-10-CM | POA: Diagnosis not present

## 2024-05-25 DIAGNOSIS — Z23 Encounter for immunization: Secondary | ICD-10-CM | POA: Diagnosis not present

## 2024-05-27 DIAGNOSIS — N186 End stage renal disease: Secondary | ICD-10-CM | POA: Diagnosis not present

## 2024-05-27 DIAGNOSIS — Z992 Dependence on renal dialysis: Secondary | ICD-10-CM | POA: Diagnosis not present

## 2024-05-27 DIAGNOSIS — Z23 Encounter for immunization: Secondary | ICD-10-CM | POA: Diagnosis not present

## 2024-05-28 DIAGNOSIS — Z992 Dependence on renal dialysis: Secondary | ICD-10-CM | POA: Diagnosis not present

## 2024-05-28 DIAGNOSIS — N186 End stage renal disease: Secondary | ICD-10-CM | POA: Diagnosis not present

## 2024-05-30 DIAGNOSIS — N186 End stage renal disease: Secondary | ICD-10-CM | POA: Diagnosis not present

## 2024-05-30 DIAGNOSIS — Z992 Dependence on renal dialysis: Secondary | ICD-10-CM | POA: Diagnosis not present

## 2024-06-01 DIAGNOSIS — N186 End stage renal disease: Secondary | ICD-10-CM | POA: Diagnosis not present

## 2024-06-01 DIAGNOSIS — Z992 Dependence on renal dialysis: Secondary | ICD-10-CM | POA: Diagnosis not present

## 2024-06-03 DIAGNOSIS — Z992 Dependence on renal dialysis: Secondary | ICD-10-CM | POA: Diagnosis not present

## 2024-06-03 DIAGNOSIS — N186 End stage renal disease: Secondary | ICD-10-CM | POA: Diagnosis not present

## 2024-06-06 DIAGNOSIS — Z992 Dependence on renal dialysis: Secondary | ICD-10-CM | POA: Diagnosis not present

## 2024-06-06 DIAGNOSIS — N186 End stage renal disease: Secondary | ICD-10-CM | POA: Diagnosis not present

## 2024-06-08 DIAGNOSIS — N186 End stage renal disease: Secondary | ICD-10-CM | POA: Diagnosis not present

## 2024-06-08 DIAGNOSIS — Z992 Dependence on renal dialysis: Secondary | ICD-10-CM | POA: Diagnosis not present

## 2024-06-10 DIAGNOSIS — N186 End stage renal disease: Secondary | ICD-10-CM | POA: Diagnosis not present

## 2024-06-10 DIAGNOSIS — Z992 Dependence on renal dialysis: Secondary | ICD-10-CM | POA: Diagnosis not present

## 2024-06-13 DIAGNOSIS — Z992 Dependence on renal dialysis: Secondary | ICD-10-CM | POA: Diagnosis not present

## 2024-06-13 DIAGNOSIS — N186 End stage renal disease: Secondary | ICD-10-CM | POA: Diagnosis not present

## 2024-06-15 DIAGNOSIS — N186 End stage renal disease: Secondary | ICD-10-CM | POA: Diagnosis not present

## 2024-06-15 DIAGNOSIS — Z992 Dependence on renal dialysis: Secondary | ICD-10-CM | POA: Diagnosis not present

## 2024-06-17 DIAGNOSIS — Z992 Dependence on renal dialysis: Secondary | ICD-10-CM | POA: Diagnosis not present

## 2024-06-17 DIAGNOSIS — N186 End stage renal disease: Secondary | ICD-10-CM | POA: Diagnosis not present

## 2024-06-20 DIAGNOSIS — Z992 Dependence on renal dialysis: Secondary | ICD-10-CM | POA: Diagnosis not present

## 2024-06-20 DIAGNOSIS — N186 End stage renal disease: Secondary | ICD-10-CM | POA: Diagnosis not present

## 2024-06-22 DIAGNOSIS — Z992 Dependence on renal dialysis: Secondary | ICD-10-CM | POA: Diagnosis not present

## 2024-06-22 DIAGNOSIS — N186 End stage renal disease: Secondary | ICD-10-CM | POA: Diagnosis not present

## 2024-06-24 DIAGNOSIS — Z992 Dependence on renal dialysis: Secondary | ICD-10-CM | POA: Diagnosis not present

## 2024-06-24 DIAGNOSIS — N186 End stage renal disease: Secondary | ICD-10-CM | POA: Diagnosis not present

## 2024-06-27 DIAGNOSIS — N186 End stage renal disease: Secondary | ICD-10-CM | POA: Diagnosis not present

## 2024-06-27 DIAGNOSIS — Z992 Dependence on renal dialysis: Secondary | ICD-10-CM | POA: Diagnosis not present

## 2024-06-29 DIAGNOSIS — Z992 Dependence on renal dialysis: Secondary | ICD-10-CM | POA: Diagnosis not present

## 2024-06-29 DIAGNOSIS — Z23 Encounter for immunization: Secondary | ICD-10-CM | POA: Diagnosis not present

## 2024-06-29 DIAGNOSIS — N186 End stage renal disease: Secondary | ICD-10-CM | POA: Diagnosis not present

## 2024-06-30 DIAGNOSIS — N186 End stage renal disease: Secondary | ICD-10-CM | POA: Diagnosis not present

## 2024-06-30 DIAGNOSIS — I10 Essential (primary) hypertension: Secondary | ICD-10-CM | POA: Diagnosis not present

## 2024-06-30 DIAGNOSIS — E1122 Type 2 diabetes mellitus with diabetic chronic kidney disease: Secondary | ICD-10-CM | POA: Diagnosis not present

## 2024-06-30 DIAGNOSIS — Z992 Dependence on renal dialysis: Secondary | ICD-10-CM | POA: Diagnosis not present

## 2024-06-30 DIAGNOSIS — I5022 Chronic systolic (congestive) heart failure: Secondary | ICD-10-CM | POA: Diagnosis not present

## 2024-07-01 DIAGNOSIS — N186 End stage renal disease: Secondary | ICD-10-CM | POA: Diagnosis not present

## 2024-07-01 DIAGNOSIS — Z23 Encounter for immunization: Secondary | ICD-10-CM | POA: Diagnosis not present

## 2024-07-01 DIAGNOSIS — Z992 Dependence on renal dialysis: Secondary | ICD-10-CM | POA: Diagnosis not present

## 2024-07-04 DIAGNOSIS — Z23 Encounter for immunization: Secondary | ICD-10-CM | POA: Diagnosis not present

## 2024-07-04 DIAGNOSIS — N186 End stage renal disease: Secondary | ICD-10-CM | POA: Diagnosis not present

## 2024-07-04 DIAGNOSIS — Z992 Dependence on renal dialysis: Secondary | ICD-10-CM | POA: Diagnosis not present

## 2024-07-05 ENCOUNTER — Ambulatory Visit (INDEPENDENT_AMBULATORY_CARE_PROVIDER_SITE_OTHER): Payer: Medicare HMO | Admitting: Vascular Surgery

## 2024-07-05 ENCOUNTER — Encounter (INDEPENDENT_AMBULATORY_CARE_PROVIDER_SITE_OTHER): Payer: Medicare HMO

## 2024-07-05 DIAGNOSIS — Z0100 Encounter for examination of eyes and vision without abnormal findings: Secondary | ICD-10-CM | POA: Diagnosis not present

## 2024-07-05 DIAGNOSIS — E113592 Type 2 diabetes mellitus with proliferative diabetic retinopathy without macular edema, left eye: Secondary | ICD-10-CM | POA: Diagnosis not present

## 2024-07-06 ENCOUNTER — Other Ambulatory Visit (INDEPENDENT_AMBULATORY_CARE_PROVIDER_SITE_OTHER): Payer: Self-pay | Admitting: Nurse Practitioner

## 2024-07-06 DIAGNOSIS — Z992 Dependence on renal dialysis: Secondary | ICD-10-CM | POA: Diagnosis not present

## 2024-07-06 DIAGNOSIS — N186 End stage renal disease: Secondary | ICD-10-CM

## 2024-07-06 DIAGNOSIS — Z23 Encounter for immunization: Secondary | ICD-10-CM | POA: Diagnosis not present

## 2024-07-07 ENCOUNTER — Other Ambulatory Visit (INDEPENDENT_AMBULATORY_CARE_PROVIDER_SITE_OTHER)

## 2024-07-07 ENCOUNTER — Ambulatory Visit (INDEPENDENT_AMBULATORY_CARE_PROVIDER_SITE_OTHER): Admitting: Nurse Practitioner

## 2024-07-07 ENCOUNTER — Encounter (INDEPENDENT_AMBULATORY_CARE_PROVIDER_SITE_OTHER): Payer: Self-pay | Admitting: Nurse Practitioner

## 2024-07-07 VITALS — BP 170/78 | HR 60 | Resp 16 | Wt 148.8 lb

## 2024-07-07 DIAGNOSIS — Z992 Dependence on renal dialysis: Secondary | ICD-10-CM | POA: Diagnosis not present

## 2024-07-07 DIAGNOSIS — N186 End stage renal disease: Secondary | ICD-10-CM | POA: Diagnosis not present

## 2024-07-07 DIAGNOSIS — E1122 Type 2 diabetes mellitus with diabetic chronic kidney disease: Secondary | ICD-10-CM | POA: Diagnosis not present

## 2024-07-07 DIAGNOSIS — I10 Essential (primary) hypertension: Secondary | ICD-10-CM

## 2024-07-07 DIAGNOSIS — Z794 Long term (current) use of insulin: Secondary | ICD-10-CM | POA: Diagnosis not present
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
## 2024-07-08 DIAGNOSIS — Z23 Encounter for immunization: Secondary | ICD-10-CM | POA: Diagnosis not present

## 2024-07-08 DIAGNOSIS — Z992 Dependence on renal dialysis: Secondary | ICD-10-CM | POA: Diagnosis not present

## 2024-07-08 DIAGNOSIS — N186 End stage renal disease: Secondary | ICD-10-CM | POA: Diagnosis not present

## 2024-07-11 DIAGNOSIS — Z23 Encounter for immunization: Secondary | ICD-10-CM | POA: Diagnosis not present

## 2024-07-11 DIAGNOSIS — Z992 Dependence on renal dialysis: Secondary | ICD-10-CM | POA: Diagnosis not present

## 2024-07-11 DIAGNOSIS — N186 End stage renal disease: Secondary | ICD-10-CM | POA: Diagnosis not present

## 2024-07-13 DIAGNOSIS — Z992 Dependence on renal dialysis: Secondary | ICD-10-CM | POA: Diagnosis not present

## 2024-07-13 DIAGNOSIS — N186 End stage renal disease: Secondary | ICD-10-CM | POA: Diagnosis not present

## 2024-07-13 DIAGNOSIS — Z23 Encounter for immunization: Secondary | ICD-10-CM | POA: Diagnosis not present

## 2024-07-15 DIAGNOSIS — Z992 Dependence on renal dialysis: Secondary | ICD-10-CM | POA: Diagnosis not present

## 2024-07-15 DIAGNOSIS — N186 End stage renal disease: Secondary | ICD-10-CM | POA: Diagnosis not present

## 2024-07-15 DIAGNOSIS — Z23 Encounter for immunization: Secondary | ICD-10-CM | POA: Diagnosis not present

## 2024-07-18 DIAGNOSIS — N186 End stage renal disease: Secondary | ICD-10-CM | POA: Diagnosis not present

## 2024-07-18 DIAGNOSIS — Z23 Encounter for immunization: Secondary | ICD-10-CM | POA: Diagnosis not present

## 2024-07-18 DIAGNOSIS — Z992 Dependence on renal dialysis: Secondary | ICD-10-CM | POA: Diagnosis not present

## 2024-07-20 DIAGNOSIS — Z992 Dependence on renal dialysis: Secondary | ICD-10-CM | POA: Diagnosis not present

## 2024-07-20 DIAGNOSIS — Z23 Encounter for immunization: Secondary | ICD-10-CM | POA: Diagnosis not present

## 2024-07-20 DIAGNOSIS — N186 End stage renal disease: Secondary | ICD-10-CM | POA: Diagnosis not present

## 2024-07-22 DIAGNOSIS — Z992 Dependence on renal dialysis: Secondary | ICD-10-CM | POA: Diagnosis not present

## 2024-07-22 DIAGNOSIS — Z23 Encounter for immunization: Secondary | ICD-10-CM | POA: Diagnosis not present

## 2024-07-22 DIAGNOSIS — N186 End stage renal disease: Secondary | ICD-10-CM | POA: Diagnosis not present

## 2024-07-25 DIAGNOSIS — Z992 Dependence on renal dialysis: Secondary | ICD-10-CM | POA: Diagnosis not present

## 2024-07-25 DIAGNOSIS — Z5181 Encounter for therapeutic drug level monitoring: Secondary | ICD-10-CM | POA: Diagnosis not present

## 2024-07-25 DIAGNOSIS — Z23 Encounter for immunization: Secondary | ICD-10-CM | POA: Diagnosis not present

## 2024-07-25 DIAGNOSIS — E1122 Type 2 diabetes mellitus with diabetic chronic kidney disease: Secondary | ICD-10-CM | POA: Diagnosis not present

## 2024-07-25 DIAGNOSIS — Z79899 Other long term (current) drug therapy: Secondary | ICD-10-CM | POA: Diagnosis not present

## 2024-07-25 DIAGNOSIS — N186 End stage renal disease: Secondary | ICD-10-CM | POA: Diagnosis not present

## 2024-07-27 DIAGNOSIS — Z23 Encounter for immunization: Secondary | ICD-10-CM | POA: Diagnosis not present

## 2024-07-27 DIAGNOSIS — Z992 Dependence on renal dialysis: Secondary | ICD-10-CM | POA: Diagnosis not present

## 2024-07-27 DIAGNOSIS — N186 End stage renal disease: Secondary | ICD-10-CM | POA: Diagnosis not present

## 2024-07-28 DIAGNOSIS — N186 End stage renal disease: Secondary | ICD-10-CM | POA: Diagnosis not present

## 2024-07-28 DIAGNOSIS — Z992 Dependence on renal dialysis: Secondary | ICD-10-CM | POA: Diagnosis not present

## 2024-07-29 DIAGNOSIS — Z992 Dependence on renal dialysis: Secondary | ICD-10-CM | POA: Diagnosis not present

## 2024-07-29 DIAGNOSIS — N186 End stage renal disease: Secondary | ICD-10-CM | POA: Diagnosis not present

## 2024-08-01 DIAGNOSIS — N186 End stage renal disease: Secondary | ICD-10-CM | POA: Diagnosis not present

## 2024-08-01 DIAGNOSIS — Z992 Dependence on renal dialysis: Secondary | ICD-10-CM | POA: Diagnosis not present

## 2024-08-03 DIAGNOSIS — N186 End stage renal disease: Secondary | ICD-10-CM | POA: Diagnosis not present

## 2024-08-03 DIAGNOSIS — Z992 Dependence on renal dialysis: Secondary | ICD-10-CM | POA: Diagnosis not present

## 2024-08-05 DIAGNOSIS — Z992 Dependence on renal dialysis: Secondary | ICD-10-CM | POA: Diagnosis not present

## 2024-08-05 DIAGNOSIS — N186 End stage renal disease: Secondary | ICD-10-CM | POA: Diagnosis not present

## 2024-08-08 DIAGNOSIS — Z992 Dependence on renal dialysis: Secondary | ICD-10-CM | POA: Diagnosis not present

## 2024-08-08 DIAGNOSIS — N186 End stage renal disease: Secondary | ICD-10-CM | POA: Diagnosis not present

## 2024-08-10 DIAGNOSIS — N186 End stage renal disease: Secondary | ICD-10-CM | POA: Diagnosis not present

## 2024-08-10 DIAGNOSIS — Z992 Dependence on renal dialysis: Secondary | ICD-10-CM | POA: Diagnosis not present

## 2024-08-12 DIAGNOSIS — N186 End stage renal disease: Secondary | ICD-10-CM | POA: Diagnosis not present

## 2024-08-12 DIAGNOSIS — Z992 Dependence on renal dialysis: Secondary | ICD-10-CM | POA: Diagnosis not present

## 2024-08-15 DIAGNOSIS — Z992 Dependence on renal dialysis: Secondary | ICD-10-CM | POA: Diagnosis not present

## 2024-08-15 DIAGNOSIS — N186 End stage renal disease: Secondary | ICD-10-CM | POA: Diagnosis not present

## 2024-08-17 DIAGNOSIS — Z992 Dependence on renal dialysis: Secondary | ICD-10-CM | POA: Diagnosis not present

## 2024-08-17 DIAGNOSIS — N186 End stage renal disease: Secondary | ICD-10-CM | POA: Diagnosis not present

## 2024-08-19 DIAGNOSIS — N186 End stage renal disease: Secondary | ICD-10-CM | POA: Diagnosis not present

## 2024-08-19 DIAGNOSIS — Z992 Dependence on renal dialysis: Secondary | ICD-10-CM | POA: Diagnosis not present

## 2024-08-22 DIAGNOSIS — N186 End stage renal disease: Secondary | ICD-10-CM | POA: Diagnosis not present

## 2024-08-22 DIAGNOSIS — Z992 Dependence on renal dialysis: Secondary | ICD-10-CM | POA: Diagnosis not present

## 2024-08-24 DIAGNOSIS — Z992 Dependence on renal dialysis: Secondary | ICD-10-CM | POA: Diagnosis not present

## 2024-08-24 DIAGNOSIS — N186 End stage renal disease: Secondary | ICD-10-CM | POA: Diagnosis not present

## 2024-08-26 DIAGNOSIS — N186 End stage renal disease: Secondary | ICD-10-CM | POA: Diagnosis not present

## 2024-08-26 DIAGNOSIS — Z992 Dependence on renal dialysis: Secondary | ICD-10-CM | POA: Diagnosis not present

## 2024-08-28 DIAGNOSIS — N186 End stage renal disease: Secondary | ICD-10-CM | POA: Diagnosis not present

## 2024-08-28 DIAGNOSIS — Z992 Dependence on renal dialysis: Secondary | ICD-10-CM | POA: Diagnosis not present

## 2024-08-29 DIAGNOSIS — N186 End stage renal disease: Secondary | ICD-10-CM | POA: Diagnosis not present

## 2024-08-29 DIAGNOSIS — Z992 Dependence on renal dialysis: Secondary | ICD-10-CM | POA: Diagnosis not present

## 2024-08-31 DIAGNOSIS — Z992 Dependence on renal dialysis: Secondary | ICD-10-CM | POA: Diagnosis not present

## 2024-08-31 DIAGNOSIS — N186 End stage renal disease: Secondary | ICD-10-CM | POA: Diagnosis not present

## 2024-09-02 DIAGNOSIS — N186 End stage renal disease: Secondary | ICD-10-CM | POA: Diagnosis not present

## 2024-09-02 DIAGNOSIS — Z992 Dependence on renal dialysis: Secondary | ICD-10-CM | POA: Diagnosis not present

## 2024-09-05 DIAGNOSIS — N186 End stage renal disease: Secondary | ICD-10-CM | POA: Diagnosis not present

## 2024-09-05 DIAGNOSIS — Z992 Dependence on renal dialysis: Secondary | ICD-10-CM | POA: Diagnosis not present

## 2024-09-07 DIAGNOSIS — Z992 Dependence on renal dialysis: Secondary | ICD-10-CM | POA: Diagnosis not present

## 2024-09-07 DIAGNOSIS — N186 End stage renal disease: Secondary | ICD-10-CM | POA: Diagnosis not present

## 2024-09-09 DIAGNOSIS — Z992 Dependence on renal dialysis: Secondary | ICD-10-CM | POA: Diagnosis not present

## 2024-09-09 DIAGNOSIS — N186 End stage renal disease: Secondary | ICD-10-CM | POA: Diagnosis not present

## 2024-09-12 DIAGNOSIS — N186 End stage renal disease: Secondary | ICD-10-CM | POA: Diagnosis not present

## 2024-09-12 DIAGNOSIS — E1121 Type 2 diabetes mellitus with diabetic nephropathy: Secondary | ICD-10-CM | POA: Diagnosis not present

## 2024-09-12 DIAGNOSIS — I10 Essential (primary) hypertension: Secondary | ICD-10-CM | POA: Diagnosis not present

## 2024-09-12 DIAGNOSIS — Z794 Long term (current) use of insulin: Secondary | ICD-10-CM | POA: Diagnosis not present

## 2024-09-12 DIAGNOSIS — E11319 Type 2 diabetes mellitus with unspecified diabetic retinopathy without macular edema: Secondary | ICD-10-CM | POA: Diagnosis not present

## 2024-09-12 DIAGNOSIS — I5022 Chronic systolic (congestive) heart failure: Secondary | ICD-10-CM | POA: Diagnosis not present

## 2024-09-12 DIAGNOSIS — K7689 Other specified diseases of liver: Secondary | ICD-10-CM | POA: Diagnosis not present

## 2024-09-12 DIAGNOSIS — I132 Hypertensive heart and chronic kidney disease with heart failure and with stage 5 chronic kidney disease, or end stage renal disease: Secondary | ICD-10-CM | POA: Diagnosis not present

## 2024-09-12 DIAGNOSIS — Z992 Dependence on renal dialysis: Secondary | ICD-10-CM | POA: Diagnosis not present

## 2024-09-12 DIAGNOSIS — Z79899 Other long term (current) drug therapy: Secondary | ICD-10-CM | POA: Diagnosis not present

## 2024-09-12 DIAGNOSIS — E1122 Type 2 diabetes mellitus with diabetic chronic kidney disease: Secondary | ICD-10-CM | POA: Diagnosis not present

## 2024-09-14 DIAGNOSIS — N186 End stage renal disease: Secondary | ICD-10-CM | POA: Diagnosis not present

## 2024-09-14 DIAGNOSIS — Z992 Dependence on renal dialysis: Secondary | ICD-10-CM | POA: Diagnosis not present

## 2024-09-16 DIAGNOSIS — N186 End stage renal disease: Secondary | ICD-10-CM | POA: Diagnosis not present

## 2024-09-16 DIAGNOSIS — Z992 Dependence on renal dialysis: Secondary | ICD-10-CM | POA: Diagnosis not present

## 2024-09-19 DIAGNOSIS — Z992 Dependence on renal dialysis: Secondary | ICD-10-CM | POA: Diagnosis not present

## 2024-09-19 DIAGNOSIS — N186 End stage renal disease: Secondary | ICD-10-CM | POA: Diagnosis not present

## 2024-09-19 DIAGNOSIS — E878 Other disorders of electrolyte and fluid balance, not elsewhere classified: Secondary | ICD-10-CM | POA: Diagnosis not present

## 2024-09-21 DIAGNOSIS — N186 End stage renal disease: Secondary | ICD-10-CM | POA: Diagnosis not present

## 2024-09-21 DIAGNOSIS — Z992 Dependence on renal dialysis: Secondary | ICD-10-CM | POA: Diagnosis not present

## 2024-09-23 DIAGNOSIS — Z992 Dependence on renal dialysis: Secondary | ICD-10-CM | POA: Diagnosis not present

## 2024-09-23 DIAGNOSIS — N186 End stage renal disease: Secondary | ICD-10-CM | POA: Diagnosis not present

## 2024-09-26 DIAGNOSIS — N186 End stage renal disease: Secondary | ICD-10-CM | POA: Diagnosis not present

## 2024-09-26 DIAGNOSIS — Z992 Dependence on renal dialysis: Secondary | ICD-10-CM | POA: Diagnosis not present

## 2024-09-27 DIAGNOSIS — Z992 Dependence on renal dialysis: Secondary | ICD-10-CM | POA: Diagnosis not present

## 2024-09-27 DIAGNOSIS — N186 End stage renal disease: Secondary | ICD-10-CM | POA: Diagnosis not present

## 2024-09-28 DIAGNOSIS — N186 End stage renal disease: Secondary | ICD-10-CM | POA: Diagnosis not present

## 2024-09-28 DIAGNOSIS — Z992 Dependence on renal dialysis: Secondary | ICD-10-CM | POA: Diagnosis not present

## 2024-09-30 DIAGNOSIS — Z992 Dependence on renal dialysis: Secondary | ICD-10-CM | POA: Diagnosis not present

## 2024-10-03 DIAGNOSIS — N186 End stage renal disease: Secondary | ICD-10-CM | POA: Diagnosis not present

## 2024-10-05 DIAGNOSIS — Z992 Dependence on renal dialysis: Secondary | ICD-10-CM | POA: Diagnosis not present

## 2024-10-05 DIAGNOSIS — N186 End stage renal disease: Secondary | ICD-10-CM | POA: Diagnosis not present

## 2024-10-07 DIAGNOSIS — N186 End stage renal disease: Secondary | ICD-10-CM | POA: Diagnosis not present

## 2024-10-10 DIAGNOSIS — Z992 Dependence on renal dialysis: Secondary | ICD-10-CM | POA: Diagnosis not present

## 2024-10-12 DIAGNOSIS — N186 End stage renal disease: Secondary | ICD-10-CM | POA: Diagnosis not present

## 2024-10-12 DIAGNOSIS — Z992 Dependence on renal dialysis: Secondary | ICD-10-CM | POA: Diagnosis not present

## 2024-10-16 DIAGNOSIS — N186 End stage renal disease: Secondary | ICD-10-CM | POA: Diagnosis not present

## 2024-10-19 DIAGNOSIS — N186 End stage renal disease: Secondary | ICD-10-CM | POA: Diagnosis not present

## 2024-10-21 DIAGNOSIS — N186 End stage renal disease: Secondary | ICD-10-CM | POA: Diagnosis not present

## 2024-10-26 DIAGNOSIS — E1122 Type 2 diabetes mellitus with diabetic chronic kidney disease: Secondary | ICD-10-CM | POA: Diagnosis not present

## 2024-10-26 DIAGNOSIS — E878 Other disorders of electrolyte and fluid balance, not elsewhere classified: Secondary | ICD-10-CM | POA: Diagnosis not present

## 2024-10-26 DIAGNOSIS — Z992 Dependence on renal dialysis: Secondary | ICD-10-CM | POA: Diagnosis not present

## 2024-10-26 DIAGNOSIS — N186 End stage renal disease: Secondary | ICD-10-CM | POA: Diagnosis not present

## 2024-10-28 DIAGNOSIS — Z992 Dependence on renal dialysis: Secondary | ICD-10-CM | POA: Diagnosis not present

## 2024-10-28 DIAGNOSIS — N186 End stage renal disease: Secondary | ICD-10-CM | POA: Diagnosis not present

## 2024-10-29 DIAGNOSIS — Z992 Dependence on renal dialysis: Secondary | ICD-10-CM | POA: Diagnosis not present

## 2024-10-29 DIAGNOSIS — N186 End stage renal disease: Secondary | ICD-10-CM | POA: Diagnosis not present

## 2024-10-31 DIAGNOSIS — Z992 Dependence on renal dialysis: Secondary | ICD-10-CM | POA: Diagnosis not present

## 2024-11-02 DIAGNOSIS — Z992 Dependence on renal dialysis: Secondary | ICD-10-CM | POA: Diagnosis not present

## 2024-11-02 DIAGNOSIS — N186 End stage renal disease: Secondary | ICD-10-CM | POA: Diagnosis not present

## 2024-11-07 DIAGNOSIS — Z992 Dependence on renal dialysis: Secondary | ICD-10-CM | POA: Diagnosis not present

## 2024-11-09 DIAGNOSIS — Z992 Dependence on renal dialysis: Secondary | ICD-10-CM | POA: Diagnosis not present

## 2024-11-10 ENCOUNTER — Ambulatory Visit

## 2024-11-10 VITALS — BP 178/80 | HR 69 | Ht 70.0 in | Wt 144.2 lb

## 2024-11-10 DIAGNOSIS — L989 Disorder of the skin and subcutaneous tissue, unspecified: Secondary | ICD-10-CM

## 2024-11-10 DIAGNOSIS — I10 Essential (primary) hypertension: Secondary | ICD-10-CM

## 2024-11-10 DIAGNOSIS — E8809 Other disorders of plasma-protein metabolism, not elsewhere classified: Secondary | ICD-10-CM

## 2024-11-10 DIAGNOSIS — E11319 Type 2 diabetes mellitus with unspecified diabetic retinopathy without macular edema: Secondary | ICD-10-CM

## 2024-11-10 DIAGNOSIS — E1143 Type 2 diabetes mellitus with diabetic autonomic (poly)neuropathy: Secondary | ICD-10-CM

## 2024-11-10 DIAGNOSIS — E1122 Type 2 diabetes mellitus with diabetic chronic kidney disease: Secondary | ICD-10-CM | POA: Diagnosis not present

## 2024-11-10 DIAGNOSIS — I5032 Chronic diastolic (congestive) heart failure: Secondary | ICD-10-CM | POA: Diagnosis not present

## 2024-11-10 DIAGNOSIS — K3184 Gastroparesis: Secondary | ICD-10-CM | POA: Diagnosis not present

## 2024-11-10 DIAGNOSIS — Z992 Dependence on renal dialysis: Secondary | ICD-10-CM

## 2024-11-10 DIAGNOSIS — N186 End stage renal disease: Secondary | ICD-10-CM | POA: Diagnosis not present

## 2024-11-10 DIAGNOSIS — N185 Chronic kidney disease, stage 5: Secondary | ICD-10-CM | POA: Diagnosis not present

## 2024-11-10 DIAGNOSIS — K909 Intestinal malabsorption, unspecified: Secondary | ICD-10-CM | POA: Diagnosis not present
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
## 2024-11-11 DIAGNOSIS — Z992 Dependence on renal dialysis: Secondary | ICD-10-CM | POA: Diagnosis not present

## 2024-11-11 LAB — LIPID PANEL
Cholesterol: 89 mg/dL (ref <200)
HDL: 66 mg/dL (ref >=40)
LDL Cholesterol (Calc): 16 mg/dL
Non-HDL Cholesterol (Calc): 23 mg/dL (ref <130)
Total CHOL/HDL Ratio: 1.3 (calc) (ref <5.0)
Triglycerides: 23 mg/dL (ref <150)

## 2024-11-11 LAB — CBC WITH DIFFERENTIAL/PLATELET
Absolute Lymphocytes: 546 {cells}/uL — ABNORMAL LOW (ref 850–3900)
Absolute Monocytes: 315 {cells}/uL (ref 200–950)
Basophils Absolute: 42 {cells}/uL (ref 0–200)
Basophils Relative: 1.2 %
Eosinophils Absolute: 263 {cells}/uL (ref 15–500)
Eosinophils Relative: 7.5 %
HCT: 35.2 % — ABNORMAL LOW (ref 38.5–50.0)
Hemoglobin: 11.5 g/dL — ABNORMAL LOW (ref 13.2–17.1)
MCH: 31.3 pg (ref 27.0–33.0)
MCHC: 32.7 g/dL (ref 32.0–36.0)
MCV: 95.9 fL (ref 80.0–100.0)
MPV: 9.7 fL (ref 7.5–12.5)
Monocytes Relative: 9 %
Neutro Abs: 2335 {cells}/uL (ref 1500–7800)
Neutrophils Relative %: 66.7 %
Platelets: 188 Thousand/uL (ref 140–400)
RBC: 3.67 Million/uL — ABNORMAL LOW (ref 4.20–5.80)
RDW: 13.2 % (ref 11.0–15.0)
Total Lymphocyte: 15.6 %
WBC: 3.5 Thousand/uL — ABNORMAL LOW (ref 3.8–10.8)

## 2024-11-11 LAB — COMPREHENSIVE METABOLIC PANEL WITH GFR
AG Ratio: 1.6 (calc) (ref 1.0–2.5)
ALT: 17 U/L (ref 9–46)
AST: 12 U/L (ref 10–40)
Albumin: 4.4 g/dL (ref 3.6–5.1)
Alkaline phosphatase (APISO): 80 U/L (ref 36–130)
BUN/Creatinine Ratio: 5 (calc) — ABNORMAL LOW (ref 6–22)
BUN: 40 mg/dL — ABNORMAL HIGH (ref 7–25)
CO2: 30 mmol/L (ref 20–32)
Calcium: 8.6 mg/dL (ref 8.6–10.3)
Chloride: 98 mmol/L (ref 98–110)
Creat: 7.31 mg/dL — ABNORMAL HIGH (ref 0.60–1.29)
Globulin: 2.8 g/dL (ref 1.9–3.7)
Glucose, Bld: 113 mg/dL — ABNORMAL HIGH (ref 65–99)
Potassium: 5.4 mmol/L — ABNORMAL HIGH (ref 3.5–5.3)
Sodium: 138 mmol/L (ref 135–146)
Total Bilirubin: 0.6 mg/dL (ref 0.2–1.2)
Total Protein: 7.2 g/dL (ref 6.1–8.1)
eGFR: 9 mL/min/1.73m2 — ABNORMAL LOW (ref >=60)

## 2024-11-11 LAB — RENAL FUNCTION PANEL
Albumin: 4.4 g/dL (ref 3.6–5.1)
BUN/Creatinine Ratio: 5 (calc) — ABNORMAL LOW (ref 6–22)
BUN: 40 mg/dL — ABNORMAL HIGH (ref 7–25)
CO2: 30 mmol/L (ref 20–32)
Calcium: 8.6 mg/dL (ref 8.6–10.3)
Chloride: 98 mmol/L (ref 98–110)
Creat: 7.31 mg/dL — ABNORMAL HIGH (ref 0.60–1.29)
Glucose, Bld: 113 mg/dL — ABNORMAL HIGH (ref 65–99)
Phosphorus: 6.6 mg/dL — ABNORMAL HIGH (ref 2.5–4.5)
Potassium: 5.4 mmol/L — ABNORMAL HIGH (ref 3.5–5.3)
Sodium: 138 mmol/L (ref 135–146)

## 2024-11-11 LAB — MAGNESIUM: Magnesium: 2.5 mg/dL (ref 1.5–2.5)

## 2024-11-11 LAB — B12 AND FOLATE PANEL
Folate: 4.4 ng/mL — ABNORMAL LOW
Vitamin B-12: 406 pg/mL (ref 200–1100)

## 2024-11-11 LAB — HEMOGLOBIN A1C
Hgb A1c MFr Bld: 6.2 % — ABNORMAL HIGH (ref <5.7)
Mean Plasma Glucose: 131 mg/dL
eAG (mmol/L): 7.3 mmol/L

## 2024-11-18 DIAGNOSIS — N186 End stage renal disease: Secondary | ICD-10-CM | POA: Diagnosis not present

## 2024-11-21 DIAGNOSIS — E878 Other disorders of electrolyte and fluid balance, not elsewhere classified: Secondary | ICD-10-CM | POA: Diagnosis not present

## 2024-11-21 DIAGNOSIS — N186 End stage renal disease: Secondary | ICD-10-CM | POA: Diagnosis not present

## 2024-11-23 DIAGNOSIS — Z992 Dependence on renal dialysis: Secondary | ICD-10-CM | POA: Diagnosis not present

## 2024-11-25 DIAGNOSIS — N186 End stage renal disease: Secondary | ICD-10-CM | POA: Diagnosis not present

## 2024-11-27 DIAGNOSIS — Z992 Dependence on renal dialysis: Secondary | ICD-10-CM | POA: Diagnosis not present

## 2024-11-27 DIAGNOSIS — N186 End stage renal disease: Secondary | ICD-10-CM | POA: Diagnosis not present

## 2024-11-28 ENCOUNTER — Other Ambulatory Visit: Payer: Self-pay

## 2024-11-29 ENCOUNTER — Ambulatory Visit

## 2024-11-29 VITALS — BP 146/70 | HR 59 | Ht 70.0 in | Wt 142.2 lb

## 2024-11-29 DIAGNOSIS — E538 Deficiency of other specified B group vitamins: Secondary | ICD-10-CM | POA: Insufficient documentation

## 2024-11-29 DIAGNOSIS — N186 End stage renal disease: Secondary | ICD-10-CM | POA: Insufficient documentation

## 2024-11-29 DIAGNOSIS — D631 Anemia in chronic kidney disease: Secondary | ICD-10-CM

## 2024-11-29 DIAGNOSIS — E1142 Type 2 diabetes mellitus with diabetic polyneuropathy: Secondary | ICD-10-CM

## 2024-11-29 DIAGNOSIS — I5032 Chronic diastolic (congestive) heart failure: Secondary | ICD-10-CM

## 2024-11-29 NOTE — Progress Notes (Signed)
 Progress Note  Physician: Jaquelynn Wanamaker A Indiyah Paone, MD   HPI: Mark [name] is a 43 y.o. male presenting on 11/29/2024 for Follow-up .  Discussed the use of AI scribe software for clinical note transcription with the patient, who gave verbal consent to proceed.  History of Present Illness   Mark [name] is a 43 year old male with end-stage renal disease on dialysis who presents with neuropathy pain management.  Peripheral neuropathy pain - Significant neuropathic pain in the legs, primarily affecting the feet - Previous trials of gabapentin  and Lyrica resulted in intolerable side effects - Topical lidocaine  has not provided relief - Performs regular foot checks for wounds or ulcers due to risk of slow healing  End-stage renal disease and dialysis-related symptoms - Undergoes hemodialysis three times per week for end-stage renal disease - Experiences post-dialysis fatigue and low energy, requiring daytime sleep - Blood pressure tends to drop during dialysis sessions - Currently taking metoprolol  and Entresto for CHF - No shortness of breath - No hospitalizations in the past year  Diabetes mellitus, Type II - Diagnosed with diabetes at age 79 or 70 - Previously required insulin  therapy - Since initiation of dialysis, blood glucose levels have stabilized without need for diabetes medication - No episodes of hypoglycemia  Anemia in CKD - Recent hemoglobin level is 11.5.  Stable   Medical history:  Relevant past medical, surgical, family and social history reviewed and updated as indicated. Interim medical history since our last visit reviewed.  Allergies and medications reviewed and updated.   ROS: Negative unless specifically indicated above in HPI.    Current Outpatient Medications:    calcium  acetate (PHOSLO ) 667 MG capsule, Take 667 mg by mouth 3 (three) times daily with meals., Disp: , Rfl:    ENTRESTO 97-103 MG, Take 1 tablet by mouth 2 (two) times  daily., Disp: , Rfl:    metoprolol  succinate (TOPROL -XL) 50 MG 24 hr tablet, Take 50 mg by mouth., Disp: , Rfl:   Current Facility-Administered Medications:    albumin  human 25 % solution 25 g, 25 g, Intravenous, Once, [place], PA-C   albumin  human 25 % solution 25 g, 25 g, Intravenous, Once, Therisa Bi, MD       Objective:     BP (!) 146/70   Pulse (!) 59   Ht 5' 10 (1.778 m)   Wt 142 lb 3.2 oz (64.5 kg)   SpO2 96%   BMI 20.40 kg/m   Wt Readings from Last 3 Encounters:  11/29/24 142 lb 3.2 oz (64.5 kg)  11/10/24 144 lb 3.2 oz (65.4 kg)  07/07/24 148 lb 12.8 oz (67.5 kg)    Physical Exam  Physical Exam Vitals reviewed.  Constitutional:      Appearance: Normal appearance. Well-developed with normal weight.  Cardiovascular:     Rate and Rhythm: Normal rate and regular rhythm. Normal heart sounds. Normal peripheral pulses Pulmonary:     Normal breath sounds with normal effort Skin:    General: Skin is warm and dry without noticeable rash.  Venous stasis dermatitis bilat LE Neurological:     General: No focal deficit present.  Psychiatric:        Mood and Affect: Mood, behavior and cognition normal      Assessment & Plan:   Encounter Diagnoses  Name Primary?   ESRD (end stage renal disease) on dialysis (HCC) Yes   Diabetic peripheral neuropathy associated with type  2 diabetes mellitus (HCC)    Chronic diastolic CHF (congestive heart failure) (HCC)    Anemia due to chronic kidney disease, unspecified CKD stage     No orders of the defined types were placed in this encounter.    Assessment and Plan    Diabetic peripheral neuropathy Chronic neuropathic pain in legs, resistant to previous treatments. Considering nortriptyline with caution due to dialysis clearance and side effects. - Need baseline ECG - If normal will prescribe nortriptyline 10 mg at low dose. - Monitor for side effects: grogginess, sedation, dizziness. - Inform nephrologist about  nortriptyline. - Follow-up after Christmas to assess treatment efficacy and side effects.  End-stage renal disease on dialysis Dialysis thrice weekly. Blood pressure management challenging. Elevated phosphorus managed by nephrology. Borderline folate likely due to dialysis. Mild anemia present. - Continue current dialysis regimen. - Discuss renal-specific multivitamin for folate with nephrologist. - Monitor blood pressure closely during dialysis.  Type 2 diabetes mellitus Hemoglobin A1c slightly elevated at 6.2%. Blood sugar stable without medication. - Continue monitoring blood sugar levels.  Anemia of chronic kidney disease Mild anemia with hemoglobin at 11.5, related to chronic kidney disease. - Continue monitoring hemoglobin levels.  Hypertension and hypotension in chronic kidney disease - Monitor blood pressure closely during dialysis.

## 2024-12-28 ENCOUNTER — Other Ambulatory Visit (INDEPENDENT_AMBULATORY_CARE_PROVIDER_SITE_OTHER): Payer: Self-pay | Admitting: Nurse Practitioner

## 2024-12-28 DIAGNOSIS — N186 End stage renal disease: Secondary | ICD-10-CM

## 2025-01-03 ENCOUNTER — Ambulatory Visit (INDEPENDENT_AMBULATORY_CARE_PROVIDER_SITE_OTHER)

## 2025-01-03 VITALS — BP 128/60 | Ht 70.0 in | Wt 142.4 lb

## 2025-01-03 DIAGNOSIS — I5032 Chronic diastolic (congestive) heart failure: Secondary | ICD-10-CM

## 2025-01-03 DIAGNOSIS — R9431 Abnormal electrocardiogram [ECG] [EKG]: Secondary | ICD-10-CM

## 2025-01-03 DIAGNOSIS — N186 End stage renal disease: Secondary | ICD-10-CM | POA: Diagnosis not present

## 2025-01-03 DIAGNOSIS — I429 Cardiomyopathy, unspecified: Secondary | ICD-10-CM

## 2025-01-03 DIAGNOSIS — I509 Heart failure, unspecified: Secondary | ICD-10-CM | POA: Diagnosis not present

## 2025-01-03 NOTE — Progress Notes (Signed)
 Spoke with patient he will talk to his nephrologist about medications. Also he didn't tolerate gabapentin 

## 2025-01-03 NOTE — Progress Notes (Signed)
 "           Progress Note  Physician: Parris DELENA Juneau, MD   HPI: Mark [name] is a 44 y.o. male presenting on 01/03/2025 for Medical Management of Chronic Issues .  Discussed the use of AI scribe software for clinical note transcription with the patient, who gave verbal consent to proceed.  History of Present Illness   Mark [name] is a 44 year old male with peripheral neuropathy who presents for follow-up and medication management.  Peripheral neuropathy - Ongoing neuropathic symptoms described as bothersome - No new medication initiated for neuropathy  Cardiac status/CHF - ACC/AHA stage C, NYHA II  - Recent evaluation by cardiology including stress test and other assessments on December 13, 2024.  12/13/2024 with upper normal LV size, LVEF 45 to 50% - HF GDMT - Cardiac function has significantly improved compared to previous year - No chest pain or breathing difficulties - Cardiac clearance obtained from transplant director; cardiology evaluation deemed satisfactory  Pulmonary nodules - Recent CT scan revealed pulmonary nodules - Referral to pulmonary specialist for further evaluation - Former smoker with a ten to twelve year history of tobacco use  Gastrointestinal symptoms and gastroparesis - Ongoing stomach issues including gastroparesis  End-stage renal disease and dialysis - Currently undergoing dialysis - No problems reported with dialysis treatment - Folate use approved by nephrologist; any over-the-counter option considered acceptable  Peripheral neuropathy has been on ongoing complaint.          Medical history:  Relevant past medical, surgical, family and social history reviewed and updated as indicated. Interim medical history since our last visit reviewed.  Allergies and medications reviewed and updated.   ROS: Negative unless specifically indicated above in HPI.   Current Medications[1]       Objective:     BP 128/60 (BP Location: Left  Arm, Patient Position: Sitting, Cuff Size: Normal)   Ht 5' 10 (1.778 m)   Wt 142 lb 6 oz (64.6 kg)   BMI 20.43 kg/m   Wt Readings from Last 3 Encounters:  01/03/25 142 lb 6 oz (64.6 kg)  11/29/24 142 lb 3.2 oz (64.5 kg)  11/10/24 144 lb 3.2 oz (65.4 kg)    Physical Exam  Physical Exam Vitals reviewed.  Constitutional:      Appearance: Normal appearance. Well-developed with normal weight.  Cardiovascular:     Rate and Rhythm: Normal rate and regular rhythm. Normal heart sounds. Normal peripheral pulses Pulmonary:     Normal breath sounds with normal effort Skin:    General: Skin is warm and dry without noticeable rash. Neurological:     General: No focal deficit present.  Psychiatric:        Mood and Affect: Mood, behavior and cognition normal      Assessment & Plan:   Encounter Diagnoses  Name Primary?   Cardiomyopathy, secondary (HCC) Yes    Orders Placed This Encounter  Procedures   EKG 12-Lead     Assessment and Plan    Peripheral neuropathy Chronic neuropathy causing significant discomfort. Nortriptyline or duloxetine   considered for pain management pending EKG clearance due to QT interval concerns. - Ordered EKG to assess QT interval before which is prolonged at 494.    - This limits use of these medications potentially.  I will have him query to cardiology.   End-stage renal disease on dialysis Managed with dialysis. Supplement OTC folate.  - Continue current dialysis regimen.  No current complications - Use over-the-counter  multivitamins as approved by nephrologist.  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 Cardiologist reports improved heart function with current medication regimen. - Continue follow-up with cardiology for ongoing management of LVH and QT interval. - Monitor for any new symptoms such as chest pain or dyspnea.                 [1]  Current Outpatient Medications:     calcium  acetate (PHOSLO ) 667 MG capsule, Take 667 mg by mouth 3 (three) times daily with meals., Disp: , Rfl:    ENTRESTO 97-103 MG, Take 1 tablet by mouth 2 (two) times daily., Disp: , Rfl:    metoprolol  succinate (TOPROL -XL) 50 MG 24 hr tablet, Take 50 mg by mouth., Disp: , Rfl:   Current Facility-Administered Medications:    albumin  human 25 % solution 25 g, 25 g, Intravenous, Once, [place], PA-C   albumin  human 25 % solution 25 g, 25 g, Intravenous, Once, Therisa Bi, MD  "

## 2025-01-05 ENCOUNTER — Ambulatory Visit (INDEPENDENT_AMBULATORY_CARE_PROVIDER_SITE_OTHER): Admitting: Nurse Practitioner

## 2025-01-05 ENCOUNTER — Encounter (INDEPENDENT_AMBULATORY_CARE_PROVIDER_SITE_OTHER)

## 2025-01-24 ENCOUNTER — Ambulatory Visit

## 2025-01-26 ENCOUNTER — Ambulatory Visit

## 2025-02-06 ENCOUNTER — Ambulatory Visit: Admitting: Family Medicine

## 2025-03-15 ENCOUNTER — Ambulatory Visit

## 2025-04-18 ENCOUNTER — Ambulatory Visit: Admitting: Family Medicine
